# Patient Record
Sex: Female | Born: 1945 | Race: White | Hispanic: No | Smoking: Former smoker
Health system: Southern US, Community
[De-identification: ages and names within clinical notes are randomized; demographics above are authoritative.]

## PROBLEM LIST (undated history)

## (undated) DIAGNOSIS — I10 Essential (primary) hypertension: Secondary | ICD-10-CM

## (undated) DIAGNOSIS — Z9889 Other specified postprocedural states: Secondary | ICD-10-CM

## (undated) DIAGNOSIS — T7840XA Allergy, unspecified, initial encounter: Secondary | ICD-10-CM

## (undated) DIAGNOSIS — R112 Nausea with vomiting, unspecified: Secondary | ICD-10-CM

## (undated) DIAGNOSIS — E785 Hyperlipidemia, unspecified: Secondary | ICD-10-CM

## (undated) DIAGNOSIS — G43909 Migraine, unspecified, not intractable, without status migrainosus: Secondary | ICD-10-CM

## (undated) DIAGNOSIS — M1990 Unspecified osteoarthritis, unspecified site: Secondary | ICD-10-CM

## (undated) DIAGNOSIS — M797 Fibromyalgia: Secondary | ICD-10-CM

## (undated) DIAGNOSIS — Z8489 Family history of other specified conditions: Secondary | ICD-10-CM

## (undated) DIAGNOSIS — I499 Cardiac arrhythmia, unspecified: Secondary | ICD-10-CM

## (undated) DIAGNOSIS — H269 Unspecified cataract: Secondary | ICD-10-CM

## (undated) DIAGNOSIS — J449 Chronic obstructive pulmonary disease, unspecified: Secondary | ICD-10-CM

## (undated) DIAGNOSIS — F32A Depression, unspecified: Secondary | ICD-10-CM

## (undated) DIAGNOSIS — E039 Hypothyroidism, unspecified: Secondary | ICD-10-CM

## (undated) DIAGNOSIS — F329 Major depressive disorder, single episode, unspecified: Secondary | ICD-10-CM

## (undated) DIAGNOSIS — D649 Anemia, unspecified: Secondary | ICD-10-CM

## (undated) DIAGNOSIS — F419 Anxiety disorder, unspecified: Secondary | ICD-10-CM

## (undated) DIAGNOSIS — Z5189 Encounter for other specified aftercare: Secondary | ICD-10-CM

## (undated) HISTORY — PX: APPENDECTOMY: SHX54

## (undated) HISTORY — DX: Anemia, unspecified: D64.9

## (undated) HISTORY — DX: Major depressive disorder, single episode, unspecified: F32.9

## (undated) HISTORY — DX: Encounter for other specified aftercare: Z51.89

## (undated) HISTORY — DX: Essential (primary) hypertension: I10

## (undated) HISTORY — DX: Hypothyroidism, unspecified: E03.9

## (undated) HISTORY — DX: Unspecified cataract: H26.9

## (undated) HISTORY — DX: Fibromyalgia: M79.7

## (undated) HISTORY — DX: Chronic obstructive pulmonary disease, unspecified: J44.9

## (undated) HISTORY — DX: Unspecified osteoarthritis, unspecified site: M19.90

## (undated) HISTORY — PX: ABDOMINAL HYSTERECTOMY: SHX81

## (undated) HISTORY — DX: Depression, unspecified: F32.A

## (undated) HISTORY — DX: Anxiety disorder, unspecified: F41.9

## (undated) HISTORY — DX: Allergy, unspecified, initial encounter: T78.40XA

## (undated) HISTORY — DX: Hyperlipidemia, unspecified: E78.5

## (undated) HISTORY — DX: Migraine, unspecified, not intractable, without status migrainosus: G43.909

## (undated) HISTORY — DX: Cardiac arrhythmia, unspecified: I49.9

## (undated) HISTORY — PX: CATARACT EXTRACTION: SUR2

---

## 1991-11-30 HISTORY — PX: NASAL SINUS SURGERY: SHX719

## 2002-09-03 ENCOUNTER — Encounter: Payer: Self-pay | Admitting: Internal Medicine

## 2005-08-18 ENCOUNTER — Ambulatory Visit: Payer: Self-pay | Admitting: Anesthesiology

## 2008-07-16 LAB — HM MAMMOGRAPHY: HM Mammogram: NORMAL

## 2009-10-17 ENCOUNTER — Ambulatory Visit: Payer: Self-pay | Admitting: Gastroenterology

## 2009-10-17 DIAGNOSIS — K625 Hemorrhage of anus and rectum: Secondary | ICD-10-CM

## 2009-10-17 DIAGNOSIS — R197 Diarrhea, unspecified: Secondary | ICD-10-CM | POA: Insufficient documentation

## 2009-10-17 LAB — CONVERTED CEMR LAB: Tissue Transglutaminase Ab, IgA: 0.3 units (ref <7)

## 2009-10-20 LAB — CONVERTED CEMR LAB
ALT: 17 units/L (ref 0–35)
AST: 24 units/L (ref 0–37)
BUN: 10 mg/dL (ref 6–23)
Basophils Absolute: 0.1 10*3/uL (ref 0.0–0.1)
Basophils Relative: 0.8 % (ref 0.0–3.0)
CO2: 29 meq/L (ref 19–32)
Calcium: 9.3 mg/dL (ref 8.4–10.5)
Creatinine, Ser: 0.7 mg/dL (ref 0.4–1.2)
Eosinophils Absolute: 0.1 10*3/uL (ref 0.0–0.7)
GFR calc non Af Amer: 90.36 mL/min (ref >60)
HCT: 36.7 % (ref 36.0–46.0)
Hemoglobin: 12.5 g/dL (ref 12.0–15.0)
IgA: 100 mg/dL (ref 68–378)
Lymphs Abs: 3.3 10*3/uL (ref 0.7–4.0)
MCHC: 34.1 g/dL (ref 30.0–36.0)
Neutro Abs: 3.8 10*3/uL (ref 1.4–7.7)
RBC: 3.52 M/uL — ABNORMAL LOW (ref 3.87–5.11)
RDW: 13.5 % (ref 11.5–14.6)
Total Bilirubin: 0.6 mg/dL (ref 0.3–1.2)

## 2009-11-04 ENCOUNTER — Telehealth (INDEPENDENT_AMBULATORY_CARE_PROVIDER_SITE_OTHER): Payer: Self-pay | Admitting: *Deleted

## 2009-11-14 ENCOUNTER — Encounter: Payer: Self-pay | Admitting: Gastroenterology

## 2010-01-08 ENCOUNTER — Telehealth (INDEPENDENT_AMBULATORY_CARE_PROVIDER_SITE_OTHER): Payer: Self-pay | Admitting: *Deleted

## 2010-01-08 ENCOUNTER — Encounter (INDEPENDENT_AMBULATORY_CARE_PROVIDER_SITE_OTHER): Payer: Self-pay | Admitting: *Deleted

## 2010-07-06 ENCOUNTER — Ambulatory Visit: Payer: Self-pay | Admitting: Internal Medicine

## 2010-07-06 DIAGNOSIS — F411 Generalized anxiety disorder: Secondary | ICD-10-CM | POA: Insufficient documentation

## 2010-07-06 DIAGNOSIS — I498 Other specified cardiac arrhythmias: Secondary | ICD-10-CM

## 2010-07-06 DIAGNOSIS — M810 Age-related osteoporosis without current pathological fracture: Secondary | ICD-10-CM

## 2010-07-06 DIAGNOSIS — M797 Fibromyalgia: Secondary | ICD-10-CM

## 2010-07-06 DIAGNOSIS — E039 Hypothyroidism, unspecified: Secondary | ICD-10-CM

## 2010-07-06 DIAGNOSIS — R002 Palpitations: Secondary | ICD-10-CM

## 2010-07-06 LAB — CONVERTED CEMR LAB
Basophils Absolute: 0.1 10*3/uL (ref 0.0–0.1)
Basophils Relative: 1 % (ref 0–1)
Calcium: 9.5 mg/dL (ref 8.4–10.5)
Eosinophils Absolute: 0.1 10*3/uL (ref 0.0–0.7)
Eosinophils Relative: 1 % (ref 0–5)
Free T4: 0.39 ng/dL — ABNORMAL LOW (ref 0.80–1.80)
Glucose, Bld: 83 mg/dL (ref 70–99)
HCT: 37.2 % (ref 36.0–46.0)
Lymphs Abs: 2.6 10*3/uL (ref 0.7–4.0)
MCHC: 32.8 g/dL (ref 30.0–36.0)
MCV: 100.5 fL — ABNORMAL HIGH (ref 78.0–100.0)
Magnesium: 2.2 mg/dL (ref 1.5–2.5)
Neutrophils Relative %: 53 % (ref 43–77)
Platelets: 284 10*3/uL (ref 150–400)
Potassium: 4.5 meq/L (ref 3.5–5.3)
RDW: 15.5 % (ref 11.5–15.5)
Sodium: 138 meq/L (ref 135–145)
TSH: 96.764 microintl units/mL — ABNORMAL HIGH (ref 0.350–4.500)
Vit D, 1,25-Dihydroxy: 25 — ABNORMAL LOW (ref 30–89)
WBC: 6.9 10*3/uL (ref 4.0–10.5)

## 2010-07-13 ENCOUNTER — Telehealth: Payer: Self-pay | Admitting: Internal Medicine

## 2010-08-04 ENCOUNTER — Telehealth: Payer: Self-pay | Admitting: Internal Medicine

## 2010-08-04 ENCOUNTER — Encounter: Payer: Self-pay | Admitting: Internal Medicine

## 2010-08-04 ENCOUNTER — Emergency Department (HOSPITAL_BASED_OUTPATIENT_CLINIC_OR_DEPARTMENT_OTHER): Admission: EM | Admit: 2010-08-04 | Discharge: 2010-08-04 | Payer: Self-pay | Admitting: Emergency Medicine

## 2010-08-04 ENCOUNTER — Ambulatory Visit: Payer: Self-pay | Admitting: Internal Medicine

## 2010-08-04 ENCOUNTER — Ambulatory Visit: Payer: Self-pay | Admitting: Diagnostic Radiology

## 2010-08-06 ENCOUNTER — Ambulatory Visit: Payer: Self-pay | Admitting: Internal Medicine

## 2010-08-06 ENCOUNTER — Telehealth: Payer: Self-pay | Admitting: Internal Medicine

## 2010-08-21 ENCOUNTER — Ambulatory Visit: Payer: Self-pay | Admitting: Internal Medicine

## 2010-08-21 LAB — CONVERTED CEMR LAB
BUN: 14 mg/dL (ref 6–23)
CO2: 28 meq/L (ref 19–32)
Eosinophils Relative: 0.7 % (ref 0.0–5.0)
HCT: 34.6 % — ABNORMAL LOW (ref 36.0–46.0)
Lymphs Abs: 1.8 10*3/uL (ref 0.7–4.0)
MCV: 103 fL — ABNORMAL HIGH (ref 78.0–100.0)
Monocytes Absolute: 0.6 10*3/uL (ref 0.1–1.0)
Platelets: 260 10*3/uL (ref 150.0–400.0)
RDW: 14.2 % (ref 11.5–14.6)
Sodium: 139 meq/L (ref 135–145)
WBC: 7.8 10*3/uL (ref 4.5–10.5)
aPTT: 29.6 s — ABNORMAL HIGH (ref 21.7–28.8)

## 2010-08-25 ENCOUNTER — Encounter: Payer: Self-pay | Admitting: Internal Medicine

## 2010-08-28 ENCOUNTER — Ambulatory Visit: Payer: Self-pay | Admitting: Internal Medicine

## 2010-08-28 ENCOUNTER — Ambulatory Visit (HOSPITAL_COMMUNITY): Admission: RE | Admit: 2010-08-28 | Discharge: 2010-08-28 | Payer: Self-pay | Admitting: Internal Medicine

## 2010-08-28 HISTORY — PX: CARDIAC ELECTROPHYSIOLOGY MAPPING AND ABLATION: SHX1292

## 2010-09-07 ENCOUNTER — Ambulatory Visit: Payer: Self-pay | Admitting: Internal Medicine

## 2010-09-07 DIAGNOSIS — G4700 Insomnia, unspecified: Secondary | ICD-10-CM

## 2010-09-28 ENCOUNTER — Ambulatory Visit: Payer: Self-pay | Admitting: Internal Medicine

## 2010-09-30 ENCOUNTER — Encounter: Payer: Self-pay | Admitting: Internal Medicine

## 2010-10-02 ENCOUNTER — Telehealth: Payer: Self-pay | Admitting: Internal Medicine

## 2010-10-02 ENCOUNTER — Ambulatory Visit: Payer: Self-pay | Admitting: Internal Medicine

## 2010-10-02 DIAGNOSIS — J209 Acute bronchitis, unspecified: Secondary | ICD-10-CM

## 2010-12-01 ENCOUNTER — Encounter: Payer: Self-pay | Admitting: Internal Medicine

## 2010-12-01 LAB — PULMONARY FUNCTION TEST

## 2010-12-07 ENCOUNTER — Ambulatory Visit
Admission: RE | Admit: 2010-12-07 | Discharge: 2010-12-07 | Payer: Self-pay | Source: Home / Self Care | Attending: Internal Medicine | Admitting: Internal Medicine

## 2010-12-07 DIAGNOSIS — I10 Essential (primary) hypertension: Secondary | ICD-10-CM | POA: Insufficient documentation

## 2010-12-08 ENCOUNTER — Encounter (INDEPENDENT_AMBULATORY_CARE_PROVIDER_SITE_OTHER): Payer: Self-pay | Admitting: *Deleted

## 2010-12-08 ENCOUNTER — Telehealth: Payer: Self-pay | Admitting: Internal Medicine

## 2010-12-22 ENCOUNTER — Ambulatory Visit: Admit: 2010-12-22 | Payer: Self-pay | Admitting: Critical Care Medicine

## 2010-12-24 ENCOUNTER — Ambulatory Visit: Admit: 2010-12-24 | Payer: Self-pay | Admitting: Internal Medicine

## 2010-12-29 NOTE — Assessment & Plan Note (Signed)
Summary: 1 MONTH FOLLOW UP/MHF   Vital Signs:  Patient profile:   65 year old female Menstrual status:  hysterectomy Height:      62 inches Weight:      118.25 pounds BMI:     21.71 Temp:     98.0 degrees F oral Pulse rate:   176 / minute Pulse rhythm:   irregular Resp:     22 per minute BP sitting:   131 / 114  (left arm)  Vitals Entered By: Glendell Docker CMA (August 04, 2010 2:03 PM)  Primary Care Provider:  Dondra Spry DO   History of Present Illness: 65 y/o  reports sudden onset of palpitation / tachycardia on her way to our office pt has had similar episodes in the past EKG shows SVT - rate of 166 and ST changes she denies chest pain or shortness of breath  she thinks drinking mineral water this AM may have triggered her symptoms she has seen cardiologist in the past but never eletrophysiologist  hypothyroidism - started synthroid.  she exp right eye swelling after starting medication.  she stopped then resumed.  no recurrence of eye swelling she has appt with [facility]o and [facility]:  allergy / intolerance to thyroid medication  she is severely hypothyroid.  recent TSH 96.7 she has chronic facial puffiness husband reports armour thyroid was best toloerated out of all thyroid replacements   Allergies: 1)  ! Pcn 2)  ! Valium  Past History:  Past Medical History: anxiety Heart arrhythmia Asthma Depression Fibromyalgia   elevated cholesterol Hypertension Hypothyroidism  Family History: sister diagnosed with colon cancer at 14, eventually passed from it.    Social History: Occupation: Disabled Married 29 years  2 daughters 1 son   Review of Systems  The patient denies chest pain and syncope.    Physical Exam  General:  alert, well-developed, and well-nourished.   Head:  normocephalic and atraumatic.   Eyes:  mild facial edema Mouth:  good dentition and pharynx pink and moist.   Neck:  No deformities, masses, or tenderness noted.no  thyromegaly and no neck tenderness.   Lungs:  normal respiratory effort, normal breath sounds, no crackles, and no wheezes.  slightly prolonged expiration Heart:  no gallop and tachycardia.   Extremities:  No lower extremity edema  Neurologic:  cranial nerves II-XII intact and gait normal.     Impression & Recommendations:  Problem # 1:  SUPRAVENTRICULAR TACHYCARDIA (ICD-427.89) 65 y/o with hx of intermiittent palpitations experiencing exacerbation.  pt referrred to ER for adenosine. ER physician suggests out pt cardiac eval in point of care markers normal arrange referral to Dr. Ladona Ridgel  Her updated medication list for this problem includes:    Metoprolol Succinate 25 Mg Xr24h-tab (Metoprolol succinate) .Marland Kitchen... 1 by mouth once daily  Orders: Cardiology Referral (Cardiology)  Problem # 2:  HYPOTHYROIDISM (ICD-244.9) pt with severe hypothyroidism.  she has been intolerant to most thyroid replacements in the past.  armour thyroid may be least bothersome.  swith to armour thyroid.  pt advised to try taking allegra 180 mg at bedtime then take armour thyroid in AM.  she was referred to Endo at [facility]  The following medications were removed from the medication list:    Synthroid 50 Mcg Tabs (Levothyroxine sodium) ..... One by mouth once daily Her updated medication list for this problem includes:    Armour Thyroid 60 Mg Tabs (Thyroid) ..... One by mouth once daily  Labs Reviewed: TSH: 96.764 (  07/06/2010)     Problem # 3:  FIBROMYALGIA (ICD-729.1) msk complaints likely related to severe hypothyroidism.  try to wean of pain meds if pt able to achieve euthyroid state Her updated medication list for this problem includes:    Hydrocodone-acetaminophen 7.5-500 Mg Tabs (Hydrocodone-acetaminophen) .Marland Kitchen... Take 1 tablet by mouth three times a day as needed  Complete Medication List: 1)  Clonazepam 0.5 Mg Tabs (Clonazepam) .... Take 1 tab by mouth at bedtime 2)  Lexapro 5 Mg Tabs (Escitalopram  oxalate) .Marland Kitchen.. 1 by mouth once daily 3)  Flovent Hfa 110 Mcg/act Aero (Fluticasone propionate  hfa) .... Up to four times a day as needed 4)  Hydrocodone-acetaminophen 7.5-500 Mg Tabs (Hydrocodone-acetaminophen) .... Take 1 tablet by mouth three times a day as needed 5)  Oxazepam 10 Mg Caps (Oxazepam) .... Take 1 tablet by mouth two times a day as needed 6)  Zantac 75 75 Mg Tabs (Ranitidine hcl) .... Take 1 capsule by mouth once a day as needed 7)  Metoprolol Succinate 25 Mg Xr24h-tab (Metoprolol succinate) .Marland Kitchen.. 1 by mouth once daily 8)  Armour Thyroid 60 Mg Tabs (Thyroid) .... One by mouth once daily Prescriptions: ARMOUR THYROID 60 MG TABS (THYROID) one by mouth once daily  #30 x 1   Entered and Authorized by:   D. Thomos Lemons DO   Signed by:   D. Thomos Lemons DO on 08/04/2010   Method used:   Electronically to        [facility] (retail)       [address]       [place], [place]  [address]       Ph: [phone] or [phone]       Fax: [phone]   RxID:   [id number] HYDROCODONE-ACETAMINOPHEN 7.5-500 MG TABS (HYDROCODONE-ACETAMINOPHEN) Take 1 tablet by mouth three times a day as needed  #90 x 0   Entered and Authorized by:   D. Thomos Lemons DO   Signed by:   D. Thomos Lemons DO on 08/04/2010   Method used:   Print then Give to Patient   RxID:   [id number] CLONAZEPAM 0.5 MG TABS (CLONAZEPAM) Take 1 tab by mouth at bedtime  #30 x 3   Entered and Authorized by:   D. Thomos Lemons DO   Signed by:   D. Thomos Lemons DO on 08/04/2010   Method used:   Print then Give to Patient   RxID:   [id number]

## 2010-12-29 NOTE — Progress Notes (Signed)
Summary: records request-- Joann Ray  Phone Note Outgoing Call   Summary of Call: Faxed request for consult and follow up notes and labs from Dr. Nicholos Johns.  Phone [phone]  fax) [phone].  Initial call taken by: Mervin Kung CMA Duncan Dull),  October 02, 2010 12:03 PM  Follow-up for Phone Call        Have not received records, request refaxed. [name] CMA Duncan Dull)  October 07, 2010 8:55 AM   Additional Follow-up for Phone Call Additional follow up Details #1::        Records received. [name] CMA Duncan Dull)  October 21, 2010 9:19 AM

## 2010-12-29 NOTE — Letter (Signed)
Summary: Appointment Reminder  [facility]  [address] [place], [place] [address]   Phone: [phone]  Fax: [phone]        January 08, 2010 MRN: [id number]    [facility] [address] [place], [place]  [address]    Dear Ms. [facility],   We have been unable to reach you by phone to schedule a Colonoscopy  appointment that was recommended for you by Dr. Christella Hartigan.  It is very   important that we reach you to schedule an appointment. We hope that you  allow [place] to participate in your health care needs. Please contact us at  [phone] at your earliest convenience to schedule your appointment.     Sincerely,    Chales Abrahams CMA (AAMA)

## 2010-12-29 NOTE — Assessment & Plan Note (Signed)
Summary: eph 3-4 weeks svt ablation/mt   Visit Type:  Follow-up Referring Provider:  n/a Primary Provider:  Dondra Spry DO   History of Present Illness: Joann Ray returns  today for followup of SVT.  The patient is a pleasant 65 yo woman who has had SVT for 38 yrs.  The spells start and stop suddenly and can be terminated with vagal maneuvers.  She underwent EPS/RFA of AVNRT several weeks ago.   She has rare palpitations but has not had any sustained arrhythmias.  No c/p or sob.  Current Medications (verified): 1)  Clonazepam 0.5 Mg Tabs (Clonazepam) .... Take 1 Tab By Mouth At Bedtime 2)  Lexapro 5 Mg Tabs (Escitalopram Oxalate) .Marland Kitchen.. 1 By Mouth Once Daily 3)  Flovent Hfa 110 Mcg/act Aero (Fluticasone Propionate  Hfa) .... Up To Four Times A Day As Needed 4)  Hydrocodone-Acetaminophen 7.5-500 Mg Tabs (Hydrocodone-Acetaminophen) .... Take 1 Tablet By Mouth Three Times A Day As Needed 5)  Oxazepam 10 Mg Caps (Oxazepam) .... Take 1 Tablet By Mouth Two Times A Day As Needed 6)  Zantac 75 75 Mg Tabs (Ranitidine Hcl) .... Take 1 Capsule By Mouth Once A Day As Needed 7)  Synthroid 25 Mcg Tabs (Levothyroxine Sodium) .... Take 1 Tablet By Mouth Once A Day  Allergies: 1)  ! Pcn 2)  ! Valium  Past History:  Past Medical History: Last updated: 09/07/2010 anxiety Heart arrhythmia  Asthma Depression Fibromyalgia   elevated cholesterol Hypertension Hypothyroidism  Past Surgical History: Last updated: 07/06/2010 appendectomy Hysterectomy Sinus Surgery 1993   Review of Systems  The patient denies chest pain, syncope, dyspnea on exertion, and peripheral edema.    Vital Signs:  Patient profile:   65 year old female Menstrual status:  hysterectomy Height:      62 inches Weight:      118 pounds BMI:     21.66 Pulse rate:   45 / minute BP sitting:   138 / 86  (right arm)  Vitals Entered By: Laurance Flatten CMA (September 28, 2010 2:37 PM)  Physical Exam  General:  alert,  well-developed, and well-nourished.   Head:  normocephalic and atraumatic.   Eyes:  mild facial edema Mouth:  good dentition and pharynx pink and moist.   Neck:  No deformities, masses, or tenderness noted.no thyromegaly and no neck tenderness.   Lungs:  normal respiratory effort and normal breath sounds.   Heart:  normal rate, regular rhythm, and no gallop.   Abdomen:  soft, non-tender, normal bowel sounds, no hepatomegaly, and no splenomegaly.   Msk:  Back normal, normal gait. Muscle strength and tone normal. Pulses:  pulses normal in all 4 extremities Extremities:  No clubbing or cyanosis. Neurologic:  Alert and oriented x 3.   Impression & Recommendations:  Problem # 1:  SUPRAVENTRICULAR TACHYCARDIA (ICD-427.89) She has not had any symptoms except for a rare brief palpitation.  I will see her back as needed.  Problem # 2:  BRADYCARDIA (ICD-427.89) She has asymptomatic bradycardia.  She will call if she develops light headedness.

## 2010-12-29 NOTE — Progress Notes (Signed)
Summary: patient thought they were to pick up an inhaler   Phone Note From Pharmacy   [facility] Call For: Artist Pais   Summary of Call: patient thought you were calling ain an inhaler.  Please let pharmacy know [phone] Initial call taken by: Roselle Locus,  October 02, 2010 1:14 PM  Follow-up for Phone Call        Rx corrected per Dr Artist Pais to 2 puffs twice a day. Correction given to Derryl Harbor at [facility] [employer]. Pt notified. [name] CMA Duncan Dull)  October 02, 2010 4:50 PM     New/Updated Medications: FLOVENT HFA 110 MCG/ACT AERO (FLUTICASONE PROPIONATE  HFA) 2 puffs twice a day Prescriptions: FLOVENT HFA 110 MCG/ACT AERO (FLUTICASONE PROPIONATE  HFA) up to four times a day as needed  #1 x 5   Entered and Authorized by:   D. Thomos Lemons DO   Signed by:   D. Thomos Lemons DO on 10/02/2010   Method used:   Electronically to        [facility] (retail)       [address]       [place], [place]  [address]       Ph: [phone] or [phone]       Fax: [phone]   RxID:   [id number]

## 2010-12-29 NOTE — Letter (Signed)
Summary: [facility]  [facility]   Imported By: Maryln Gottron 11/02/2010 12:31:27  _____________________________________________________________________  External Attachment:    Type:   Image     Comment:   External Document

## 2010-12-29 NOTE — Assessment & Plan Note (Signed)
Summary: new to est/mhf   Vital Signs:  Patient profile:   65 year old female Menstrual status:  hysterectomy Height:      62 inches Weight:      121.25 pounds BMI:     22.26 O2 Sat:      100 % on Room air Temp:     98.1 degrees F oral Pulse rate:   47 / minute Pulse rhythm:   regular Resp:     20 per minute BP sitting:   130 / 80  (right arm) Cuff size:   regular  Vitals Entered By: Glendell Docker CMA (July 06, 2010 1:30 PM)  O2 Flow:  Room air  Primary Care Provider:  D. Thomos Lemons DO   History of Present Illness: 65 y/o white female to establish prev PCP Benedetto Goad  hx of hypothyroidism  she has not taken thyroid medication x 1 month thyroid medication makes her fibromyalgia worse, can't sleep, joint pains prev tried cytomel, armour thyroid, and synthroid also tried taking thyroid medication at bedtime - makes me feel wired  hx of silent migraines - diagnosed by Dr. Providence Crosby in [place]  hx of fibromyalgia - seen at [facility] - 2000-2001 prescribed hydrocodone neurontin not effective muscle relaxers makes her feel "wired"  chronic poor sleep - trouble shutting her mind off  omega 3 and cholcolate also makes her feel poorly     Preventive Screening-Counseling & Management  Alcohol-Tobacco     Alcohol drinks/day: 0     Smoking Status: current     Packs/Day: 0.75     Year Started: 1963  Caffeine-Diet-Exercise     Caffeine use/day: 2 beverages daily     Does Patient Exercise: no  EKG  Procedure date:  07/06/2010  Findings:      Sinus bradycardia 43 bpm lateral T wave abnormality  Allergies: 1)  ! Pcn 2)  ! Valium  Past History:  Past Medical History: anxiety Heart arrhythmia Asthma Depression Fibromyalgia  elevated cholesterol Hypertension Hypothyroidism  Past Surgical History: appendectomy Hysterectomy Sinus Surgery 1993   Family History: sister diagnosed with colon cancer at 75, eventually passed from it.   Social  History: Occupation: Disabled Married 29 years 2 daughters 1 son  Smoking Status:  current Packs/Day:  0.75 Caffeine use/day:  2 beverages daily Does Patient Exercise:  no  Review of Systems       All other systems were reviewed and were negative.  General:  Complains of fatigue; denies loss of appetite. ENT:  Denies decreased hearing and earache. CV:  Complains of fatigue; denies chest pain or discomfort, fainting, and swelling of feet. Resp:  Denies chest discomfort, cough, and wheezing. MS:  Complains of muscle aches.  Physical Exam  General:  alert, well-developed, and well-nourished.   Head:  normocephalic and atraumatic.   Eyes:  pupils equal, pupils round, and pupils reactive to light.   Ears:  R ear normal and L ear normal.   Mouth:  pharynx pink and moist.   Neck:  No deformities, masses, or tenderness noted.no thyromegaly and no neck tenderness.   Lungs:  normal respiratory effort, normal breath sounds, no crackles, and no wheezes.  slightly prolonged expiration Heart:  normal rate, regular rhythm, and no gallop.   Abdomen:  soft, non-tender, normal bowel sounds, no hepatomegaly, and no splenomegaly.   Extremities:  No lower extremity edema  Neurologic:  cranial nerves II-XII intact and gait normal.   Psych:  normally interactive, good eye  contact, not anxious appearing, and not depressed appearing.     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   she has difficulty tolerating thyroid replacement.   It causes joint pains / exacerbates her fibromyalgia.  she has tried multiple preps w/o improvement.   refer to endo  Her updated medication list for this problem includes:    Synthroid 150 Mcg Tabs (Levothyroxine sodium) .Marland Kitchen... 1 by mouth once daily  Orders: T-Basic Metabolic Panel [id number]) T-TSH [phone]) T-T4, Free [phone]) T- * Misc. Laboratory test [id number])  Problem  # 2:  BRADYCARDIA (ICD-427.89) reduce b blocker dose.  hypothyroidism may be contributing  The following medications were removed from the medication list:    Atenolol 25 Mg Tabs (Atenolol) .Marland Kitchen... 1 by mouth once daily    Toprol Xl 25 Mg Xr24h-tab (Metoprolol succinate) .Marland Kitchen... Take 1 tablet by mouth once a day as needed fast heart rate Her updated medication list for this problem includes:    Metoprolol Succinate 25 Mg Xr24h-tab (Metoprolol succinate) .Marland Kitchen... 1 by mouth once daily  Labs Reviewed: Na: 140 (10/17/2009)   K+: 4.4 (10/17/2009)   CL: 103 (10/17/2009)   HCO3: 29 (10/17/2009) Ca: 9.3 (10/17/2009)   TSH: 25.70 (10/17/2009)   HCO3: 29 (10/17/2009)  Problem # 3:  ANXIETY DEPRESSION (ICD-300.4) reasses after adequate mgt of hypothyroidism Orders: T-CBC w/Diff ([id number])  Problem # 4:  PALPITATIONS, OCCASIONAL (ICD-785.1)  The following medications were removed from the medication list:    Atenolol 25 Mg Tabs (Atenolol) .Marland Kitchen... 1 by mouth once daily    Toprol Xl 25 Mg Xr24h-tab (Metoprolol succinate) .Marland Kitchen... Take 1 tablet by mouth once a day as needed fast heart rate Her updated medication list for this problem includes:    Metoprolol Succinate 25 Mg Xr24h-tab (Metoprolol succinate) .Marland Kitchen... 1 by mouth once daily  Orders: T-Magnesium ([id number]) EKG w/ Interpretation (93[address])  Problem # 5:  FIBROMYALGIA (ICD-729.1) prev seen at [facility].   she has been on hydrocodone for years.  we discussed tapering off and trying other tx / medications Her updated medication list for this problem includes:    Hydrocodone-acetaminophen 7.5-500 Mg Tabs (Hydrocodone-acetaminophen) .Marland Kitchen... Take 1 tablet by mouth three times a day as needed  Complete Medication List: 1)  Synthroid 150 Mcg Tabs (Levothyroxine sodium) .Marland Kitchen.. 1 by mouth once daily 2)  Clonazepam 0.5 Mg Tabs (Clonazepam) .... Take 1 tab by mouth at bedtime 3)  Lexapro 5 Mg Tabs (Escitalopram oxalate) .Marland Kitchen.. 1 by mouth once daily 4)  Flovent Hfa  110 Mcg/act Aero (Fluticasone propionate  hfa) .... Up to four times a day as needed 5)  Hydrocodone-acetaminophen 7.5-500 Mg Tabs (Hydrocodone-acetaminophen) .... Take 1 tablet by mouth three times a day as needed 6)  Oxazepam 10 Mg Caps (Oxazepam) .... Take 1 tablet by mouth two times a day as needed 7)  Zantac 75 75 Mg Tabs (Ranitidine hcl) .... Take 1 capsule by mouth once a day as needed 8)  Metoprolol Succinate 25 Mg Xr24h-tab (Metoprolol succinate) .Marland Kitchen.. 1 by mouth once daily  Patient Instructions: 1)  Please forward copy of your previous MRI of brain, 2 D Echo, and stress test.   2)  Please schedule a follow-up appointment in 1 month. Prescriptions: HYDROCODONE-ACETAMINOPHEN 7.5-500 MG TABS (HYDROCODONE-ACETAMINOPHEN) Take 1 tablet by mouth three times a day as needed  #90 x 0   Entered and Authorized by:   D. Thomos Lemons DO   Signed by:  Dondra Spry DO on 07/06/2010   Method used:   Print then Give to Patient   RxID:   [id number] CLONAZEPAM 0.5 MG TABS (CLONAZEPAM) Take 1 tab by mouth at bedtime  #30 x 0   Entered and Authorized by:   D. Thomos Lemons DO   Signed by:   D. Thomos Lemons DO on 07/06/2010   Method used:   Print then Give to Patient   RxID:   [id number] METOPROLOL SUCCINATE 25 MG XR24H-TAB (METOPROLOL SUCCINATE) 1 by mouth once daily  #30 x 1   Entered and Authorized by:   D. Thomos Lemons DO   Signed by:   D. Thomos Lemons DO on 07/06/2010   Method used:   Electronically to        [facility]  [place] [address] (retail)       [address]       [place], [place]  [address]       Ph: [phone] or [phone]       Fax: [phone]   RxID:   [id number]   Current Allergies (reviewed today): ! PCN ! VALIUM   Preventive Care Screening  Pap Smear:    Date:  07/06/2010    Results:  Hysterecomy  Mammogram:    Date:  07/16/2008    Results:  normal   Last Tetanus Booster:    Date:  07/19/2005    Results:  Historical

## 2010-12-29 NOTE — Consult Note (Signed)
Summary: [facility]  [facility]   Imported By: Maryln Gottron 11/02/2010 12:34:32  _____________________________________________________________________  External Attachment:    Type:   Image     Comment:   External Document

## 2010-12-29 NOTE — Progress Notes (Signed)
Summary: Colon  Phone Note Outgoing Call Call back at [facility] Phone [phone]   Call placed by: Chales Abrahams CMA Duncan Dull),  January 08, 2010 8:04 AM Summary of Call: called to remind pt to reschedule procedure left message on machine to call back  Initial call taken by: Chales Abrahams CMA Duncan Dull),  January 08, 2010 8:04 AM  Follow-up for Phone Call        left message on machine to call back letter mailed. Follow-up by: Chales Abrahams CMA Duncan Dull),  January 08, 2010 4:17 PM

## 2010-12-29 NOTE — Assessment & Plan Note (Signed)
Summary: SINUS/ COUGH/HEA--Rm 3   Vital Signs:  Patient profile:   65 year old female Menstrual status:  hysterectomy Height:      62 inches Weight:      118 pounds O2 Sat:      99 % on Room air Temp:     97.9 degrees F oral Pulse rate:   60 / minute Pulse rhythm:   regular Resp:     18 per minute BP sitting:   120 / 80  (left arm) Cuff size:   regular  Vitals Entered By: Mervin Kung CMA Duncan Dull) (October 02, 2010 10:38 AM)  O2 Flow:  Room air CC: Pt states she has had a sore throat since the weekend. Has had sinus pressure and productive cough with yellow sputum x 1 week. Is Patient Diabetic? No Pain Assessment Patient in pain? no      Comments States ribs hurt from coughing. [name] CMA Duncan Dull)  October 02, 2010 10:44 AM    Primary Care Provider:  Dondra Spry DO  CC:  Pt states she has had a sore throat since the weekend. Has had sinus pressure and productive cough with yellow sputum x 1 week.Marland Kitchen  History of Present Illness: 1 week of cough - slight discolored sinus congestion sore throat mild SOB  allergy testing completed   Preventive Screening-Counseling & Management  Alcohol-Tobacco     Alcohol drinks/day: 0     Smoking Status: quit < 6 months     Packs/Day: 0.75     Year Started: 1963  Allergies: 1)  ! Pcn  Past History:  Past Medical History: anxiety Heart arrhythmia  Asthma Depression Fibromyalgia   elevated cholesterol Hypertension Hypothyroidism COPD  Physical Exam  General:  alert, well-developed, and well-nourished.   Head:  normocephalic and atraumatic.   Ears:  R ear normal and L ear normal.   Mouth:  pharyngeal erythema.   Lungs:  normal respiratory effort.  scattered expiratory wheezing Heart:  normal rate, regular rhythm, and no gallop.     Impression & Recommendations:  Problem # 1:  ACUTE BRONCHITIS (ICD-466.0)  Her updated medication list for this problem includes:    Flovent Hfa 110 Mcg/act Aero  (Fluticasone propionate  hfa) .Marland Kitchen... 2 puffs twice a day    Avelox 400 Mg Tabs (Moxifloxacin hcl) ..... One by mouth once daily    Hydrocod Polst-chlorphen Polst 10-8 Mg/2ml Lqcr (Hydrocod polst-chlorphen polst) .Marland KitchenMarland KitchenMarland KitchenMarland Kitchen 5 ml by mouth two times a day prn  Take antibiotics and other medications as directed. Encouraged to push clear liquids, get enough rest, and take acetaminophen as needed. To be seen in 5-7 days if no improvement, sooner if worse.  Complete Medication List: 1)  Clonazepam 0.5 Mg Tabs (Clonazepam) .... Take 1 tab by mouth at bedtime 2)  Lexapro 5 Mg Tabs (Escitalopram oxalate) .Marland Kitchen.. 1 by mouth once daily 3)  Flovent Hfa 110 Mcg/act Aero (Fluticasone propionate  hfa) .... 2 puffs twice a day 4)  Hydrocodone-acetaminophen 7.5-500 Mg Tabs (Hydrocodone-acetaminophen) .... Take 1 tablet by mouth three times a day as needed 5)  Oxazepam 10 Mg Caps (Oxazepam) .... Take 1 tablet by mouth two times a day as needed 6)  Zantac 75 75 Mg Tabs (Ranitidine hcl) .... Take 1 capsule by mouth once a day as needed 7)  Synthroid 25 Mcg Tabs (Levothyroxine sodium) .... Take 1 tablet by mouth once a day 8)  Avelox 400 Mg Tabs (Moxifloxacin hcl) .... One by mouth once  daily 9)  Medrol (pak) 4 Mg Tabs (Methylprednisolone) .... Take as directed 10)  Hydrocod Polst-chlorphen Polst 10-8 Mg/1ml Lqcr (Hydrocod polst-chlorphen polst) .... 5 ml by mouth two times a day prn  Other Orders: Rapid Strep ([address])  Patient Instructions: 1)  Call our office if your symptoms do not  improve or gets worse. Prescriptions: HYDROCOD POLST-CHLORPHEN POLST 10-8 MG/5ML LQCR (HYDROCOD POLST-CHLORPHEN POLST) 5 ml by mouth two times a day prn  #60 ml x 0   Entered and Authorized by:   D. Thomos Lemons DO   Signed by:   D. Thomos Lemons DO on 10/02/2010   Method used:   Print then Give to Patient   RxID:   [id number] MEDROL (PAK) 4 MG TABS (METHYLPREDNISOLONE) take as directed  #1 x 0   Entered and Authorized by:   D. Thomos Lemons DO   Signed by:   D. Thomos Lemons DO on 10/02/2010   Method used:   Electronically to        [facility] (retail)       [address]       [place], [place]  [address]       Ph: [phone] or [phone]       Fax: [phone]   RxID:   [id number]    Orders Added: 1)  Rapid Strep [75643] 2)  Est. Patient Level III [32951]     Current Allergies (reviewed today): ! PCN  Laboratory Results   Date/Time Reported: Mervin Kung CMA [facility] Dull)  October 02, 2010 10:51 AM   Other Tests  Rapid Strep: negative  Kit Test Internal QC: Positive   (Normal Range: Negative)

## 2010-12-29 NOTE — Assessment & Plan Note (Signed)
Summary: nep/ ed referral dx:- svt / gd   Visit Type:  Initial Consult Referring Provider:  n/a Primary Provider:  Dondra Spry DO   History of Present Illness: Joann Ray is referred today for evaluation of SVT by Dr. Artist Pais.  The patient is a pleasant 65 yo woman who has had SVT for 38 yrs.  The spells start and stop suddenly and can be terminated with vagal maneuvers.  Bending over will often start the episodes and they may last several hrs.  She denies syncope or chest pain.  They are increasing in frequency and severity.  Current Medications (verified): 1)  Clonazepam 0.5 Mg Tabs (Clonazepam) .... Take 1 Tab By Mouth At Bedtime 2)  Lexapro 5 Mg Tabs (Escitalopram Oxalate) .Marland Kitchen.. 1 By Mouth Once Daily 3)  Flovent Hfa 110 Mcg/act Aero (Fluticasone Propionate  Hfa) .... Up To Four Times A Day As Needed 4)  Hydrocodone-Acetaminophen 7.5-500 Mg Tabs (Hydrocodone-Acetaminophen) .... Take 1 Tablet By Mouth Three Times A Day As Needed 5)  Oxazepam 10 Mg Caps (Oxazepam) .... Take 1 Tablet By Mouth Two Times A Day As Needed 6)  Zantac 75 75 Mg Tabs (Ranitidine Hcl) .... Take 1 Capsule By Mouth Once A Day As Needed 7)  Metoprolol Succinate 25 Mg Xr24h-Tab (Metoprolol Succinate) .Marland Kitchen.. 1 By Mouth Once Daily 8)  Armour Thyroid 60 Mg Tabs (Thyroid) .... One By Mouth Once Daily  Allergies: 1)  ! Pcn 2)  ! Valium  Past History:  Past Medical History: Last updated: 08/04/2010 anxiety Heart arrhythmia Asthma Depression Fibromyalgia   elevated cholesterol Hypertension Hypothyroidism  Past Surgical History: Last updated: 07/06/2010 appendectomy Hysterectomy Sinus Surgery 1993   Family History: Last updated: 08/04/2010 sister diagnosed with colon cancer at 6, eventually passed from it.    Social History: Last updated: 08/04/2010 Occupation: Disabled Married 29 years  2 daughters 1 son   Review of Systems       All systems reviewed and negative except as noted in the HPI and  mild wheezing.  Vital Signs:  Patient profile:   65 year old female Menstrual status:  hysterectomy Height:      62 inches Weight:      118 pounds BMI:     21.66 Pulse rate:   46 / minute BP sitting:   148 / 88  (left arm)  Vitals Entered By: Laurance Flatten CMA (August 06, 2010 9:28 AM)  Physical Exam  General:  Well developed, well nourished, in no acute distress.  HEENT: normal Neck: supple. No JVD. Carotids 2+ bilaterally no bruits Cor: RRR no rubs, gallops or murmur Lungs: CTA except for a mild respiratory wheeze. Ab: soft, nontender. nondistended. No HSM. Good bowel sounds Ext: warm. no cyanosis, clubbing or edema Neuro: alert and oriented. Grossly nonfocal. affect pleasant    EKG  Procedure date:  08/06/2010  Findings:      Sinus bradycardia with rate of:  46.  Impression & Recommendations:  Problem # 1:  SUPRAVENTRICULAR TACHYCARDIA (ICD-427.89) I have discussed the risks/benefits/goals/expectations of EPS/RFA of SVT and she wishes to proceed.   Her updated medication list for this problem includes:    Metoprolol Succinate 25 Mg Xr24h-tab (Metoprolol succinate) .Marland Kitchen... 1 by mouth once daily  Problem # 2:  BRADYCARDIA (ICD-427.89) Hopefully this will improve once she has had her ablation and we can stop the metoprolol. Her updated medication list for this problem includes:    Metoprolol Succinate 25 Mg Xr24h-tab (Metoprolol succinate) .Marland KitchenMarland KitchenMarland KitchenMarland Kitchen 1  by mouth once daily  Patient Instructions: 1)  Your physician recommends that you continue on your current medications as directed. Please refer to the Current Medication list given to you today. 2)  Your physician has recommended that you have an ablation.  Catheter ablation is a medical procedure used to treat some cardiac arrhythmias (irregular heartbeats). During catheter ablation, a long, thin, flexible tube is put into a blood vessel in your groin (upper thigh), or neck. This tube is called an ablation catheter. It is  then guided to your heart through the blood vessel. Radiofrequency waves destroy small areas of heart tissue where abnormal heartbeats may cause an arrhythmia to start.  Please see the instruction sheet given to you today.

## 2010-12-29 NOTE — Progress Notes (Signed)
Summary: Lab Results  Phone Note Outgoing Call   Summary of Call: call pt - blood work shows pt severely hypothyroid.  I suggest referral to endocrinologist at [facility] since she has difficulty with side effects from thyroid replacement  also inform pt.  vit d levels low.  I suggest pt take vit D3 2000 units once daily (OTC) Initial call taken by: D. Thomos Lemons DO,  July 13, 2010 2:35 PM  Follow-up for Phone Call        Also, I suggest pt start at least lower dose of synthroid while waiting for endo appt Follow-up by: D. Thomos Lemons DO,  July 13, 2010 5:49 PM  Additional Follow-up for Phone Call Additional follow up Details #1::        attempted to contact patient at [phone] no answer, voice message left for patient to return call Additional Follow-up by: Glendell Docker CMA,  July 14, 2010 8:04 AM    Additional Follow-up for Phone Call Additional follow up Details #2::    call returned from patient, she was advised per Dr Artist Pais instructions.  She states the pharmacy never received the rx for the toprol and asked if it could be resubmitted to the pharmacy. She was informed rx would be resubmitted. Patients states she has a follow up  appt with Dr Artist Pais on the 6th of September and will discuss the remainder of her labs and referral when she comes in Follow-up by: Glendell Docker CMA,  July 15, 2010 8:13 AM  New/Updated Medications: SYNTHROID 50 MCG TABS (LEVOTHYROXINE SODIUM) one by mouth once daily Prescriptions: METOPROLOL SUCCINATE 25 MG XR24H-TAB (METOPROLOL SUCCINATE) 1 by mouth once daily  #30 x 1   Entered by:   Glendell Docker CMA   Authorized by:   D. Thomos Lemons DO   Signed by:   Glendell Docker CMA on 07/15/2010   Method used:   Electronically to        [facility] (retail)       [address]       [place], [place]  [address]       Ph: [phone] or [phone]       Fax: [phone]   RxID:    [id number] SYNTHROID 50 MCG TABS (LEVOTHYROXINE SODIUM) one by mouth once daily  #30 x 2   Entered and Authorized by:   D. Thomos Lemons DO   Signed by:   D. Thomos Lemons DO on 07/13/2010   Method used:   Electronically to        [facility] (retail)       [address]       [place], [place]  [address]       Ph: [phone] or [phone]       Fax: [phone]   RxID:   [id number]

## 2010-12-29 NOTE — Progress Notes (Signed)
Summary: DR RAY WOULD LIKE TO SPEAK TO JXB [phone]  Phone Note Other Incoming   Caller: DR RAY  Summary of Call: DR RAY WOULD LIKE TO SPEAK TO YOU ABOUT THIS PATIENT  [phone] Initial call taken by: Roselle Locus,  August 04, 2010 3:10 PM  Follow-up for Phone Call        pt converted with adenosine. she is feeling better if cardiac enzymes normal,  we will arrange out pt f/u with Dr. Ladona Ridgel Follow-up by: D. Thomos Lemons DO,  August 04, 2010 4:59 PM

## 2010-12-29 NOTE — Assessment & Plan Note (Signed)
Summary: FOLLOW UP/DK   Vital Signs:  Patient profile:   65 year old female Menstrual status:  hysterectomy Height:      62 inches Weight:      122 pounds BMI:     22.39 O2 Sat:      98 % on Room air Temp:     97.9 degrees F oral Pulse rate:   50 / minute Pulse rhythm:   regular Resp:     18 per minute BP sitting:   138 / 78  (right arm) Cuff size:   regular  Vitals Entered By: Glendell Docker CMA (September 07, 2010 2:35 PM)  O2 Flow:  Room air CC: follow-up visit Is Patient Diabetic? No Pain Assessment Patient in pain? no        Primary Care Provider:  Dondra Spry DO  CC:  follow-up visit.  History of Present Illness: 65 y/o white female for f/u pt had ablation. palpitations much better  severe hypothyroidism seen by endo at [facility] allergy eval initiated  fibromyalgia - [facility] specialist feels muscle pains should improve with tx of hypothyroidism armour thyroid changed to synthroid due to concerns of T3 exacerbating arrhythmia  Preventive Screening-Counseling & Management  Alcohol-Tobacco     Smoking Status: quit < 6 months  Allergies: 1)  ! Pcn 2)  ! Valium  Past History:  Past Medical History: anxiety Heart arrhythmia  Asthma Depression Fibromyalgia   elevated cholesterol Hypertension Hypothyroidism  Social History: Smoking Status:  quit < 6 months  Physical Exam  General:  alert, well-developed, and well-nourished.   Lungs:  normal respiratory effort and normal breath sounds.   Heart:  normal rate, regular rhythm, and no gallop.     Impression & Recommendations:  Problem # 1:  HYPOTHYROIDISM (ICD-244.9) Assessment Improved continue f/u with endo at [facility]  Her updated medication list for this problem includes:    Synthroid 25 Mcg Tabs (Levothyroxine sodium) .Marland Kitchen... Take 1 tablet by mouth once a day  Problem # 2:  FIBROMYALGIA (ICD-729.1) continue hydrocodone for now.  we discussed tapering off pain meds once she is  euthyroid  Her updated medication list for this problem includes:    Hydrocodone-acetaminophen 7.5-500 Mg Tabs (Hydrocodone-acetaminophen) .Marland Kitchen... Take 1 tablet by mouth three times a day as needed  Problem # 3:  INSOMNIA (ICD-780.52) Assessment: Unchanged  Problem # 4:  SUPRAVENTRICULAR TACHYCARDIA (ICD-427.89) Assessment: Improved S/P ablation for SVT The following medications were removed from the medication list:    Metoprolol Succinate 25 Mg Xr24h-tab (Metoprolol succinate) .Marland Kitchen... 1 by mouth once daily  Complete Medication List: 1)  Clonazepam 0.5 Mg Tabs (Clonazepam) .... Take 1 tab by mouth at bedtime 2)  Lexapro 5 Mg Tabs (Escitalopram oxalate) .Marland Kitchen.. 1 by mouth once daily 3)  Flovent Hfa 110 Mcg/act Aero (Fluticasone propionate  hfa) .... Up to four times a day as needed 4)  Hydrocodone-acetaminophen 7.5-500 Mg Tabs (Hydrocodone-acetaminophen) .... Take 1 tablet by mouth three times a day as needed 5)  Oxazepam 10 Mg Caps (Oxazepam) .... Take 1 tablet by mouth two times a day as needed 6)  Zantac 75 75 Mg Tabs (Ranitidine hcl) .... Take 1 capsule by mouth once a day as needed 7)  Synthroid 25 Mcg Tabs (Levothyroxine sodium) .... Take 1 tablet by mouth once a day  Other Orders: Influenza Vaccine MCR (82956) Administration Flu vaccine - MCR (O1308)  Patient Instructions: 1)  Please schedule a follow-up appointment in 3 months. Prescriptions: OXAZEPAM 10 MG CAPS (  OXAZEPAM) Take 1 tablet by mouth two times a day as needed  #60 x 3   Entered and Authorized by:   D. Thomos Lemons DO   Signed by:   D. Thomos Lemons DO on 09/07/2010   Method used:   Print then Give to Patient   RxID:   [id number] LEXAPRO 5 MG TABS (ESCITALOPRAM OXALATE) 1 by mouth once daily  #90 x 1   Entered and Authorized by:   D. Thomos Lemons DO   Signed by:   D. Thomos Lemons DO on 09/07/2010   Method used:   Print then Give to Patient   RxID:   [id number] CLONAZEPAM 0.5 MG TABS (CLONAZEPAM) Take 1 tab by  mouth at bedtime  #30 x 3   Entered and Authorized by:   D. Thomos Lemons DO   Signed by:   D. Thomos Lemons DO on 09/07/2010   Method used:   Print then Give to Patient   RxID:   [id number] HYDROCODONE-ACETAMINOPHEN 7.5-500 MG TABS (HYDROCODONE-ACETAMINOPHEN) Take 1 tablet by mouth three times a day as needed  #90 x 2   Entered and Authorized by:   D. Thomos Lemons DO   Signed by:   D. Thomos Lemons DO on 09/07/2010   Method used:   Print then Give to Patient   RxID:   [id number]   Current Allergies (reviewed today): ! PCN ! VALIUM    Immunizations Administered:  Influenza Vaccine # 1:    Vaccine Type: Fluvax MCR    Site: right deltoid    Mfr: GlaxoSmithKline    Dose: 0.5 ml    Route: IM    Given by: Glendell Docker CMA    Exp. Date: 05/29/2011    Lot #: [id number]    VIS given: 06/23/10 version given September 07, 2010.  Flu Vaccine Consent Questions:    Do you have a history of severe allergic reactions to this vaccine? no    Any prior history of allergic reactions to egg and/or gelatin? no    Do you have a sensitivity to the preservative Thimersol? no    Do you have a past history of Guillan-Barre Syndrome? no    Do you currently have an acute febrile illness? no    Have you ever had a severe reaction to latex? no    Vaccine information given and explained to patient? yes    Are you currently pregnant? no

## 2010-12-29 NOTE — Letter (Signed)
Summary: Handicapped Placard/[facility]  Handicapped Placard/[facility]   Imported By: Sherian Rein 08/12/2010 11:13:58  _____________________________________________________________________  External Attachment:    Type:   Image     Comment:   External Document

## 2010-12-29 NOTE — Progress Notes (Signed)
Summary: FU visit question from patient  Phone Note Call from Patient Call back at Home Phone [phone]   Caller: Patient Summary of Call: Pt said thank you for referring her to Dr Ladona Ridgel, she wants to know if she needs to make an appt for a f/u visit with you Initial call taken by: Lannette Donath,  August 06, 2010 1:12 PM  Follow-up for Phone Call        yes, I suggest OV within 1 month Follow-up by: D. Thomos Lemons DO,  August 06, 2010 1:44 PM  Additional Follow-up for Phone Call Additional follow up Details #1::        call returned to patient at [phone], she has been advised per Dr Artist Pais instructions. Follow up scheduled for September 07, 2010 Additional Follow-up by: Glendell Docker CMA,  August 06, 2010 4:38 PM

## 2010-12-29 NOTE — Letter (Signed)
Summary: ELectrophysiology/Ablation Procedure Instructions  [facility], Main Office  [address]   [place], [place] [address]   Phone: [phone]  Fax: [phone]     Electrophysiology/Ablation Procedure Instructions    You are scheduled for a(n) SVT ablation on 08/28/10 at 7:30am with Dr. Ladona Ridgel.  1.  Please come to the Short Stay Center at [facility] at 5:30am on the day of your procedure.  2.  Come prepared to stay overnight.   Please bring your insurance cards and a list of your medications.  3.  Come to the [facility] office on 9/23/11at 9:00am for lab work.    You do not have to be fasting.  4.  Do not have anything to eat or drink after midnight the night before your procedure.  5.  Do NOT take these medications for 2  days before your procedure unless otherwise instructed:  Metoprlol.  All of your remaining medications may be taken with a small amount of water.  6.  Educational material received:  Ablation   * Occasionally, EP studies and ablations can become lengthy.  Please make your family aware of this before your procedure starts.  Average time ranges from 2-8 hours for EP studies/ablations.  Your physician will locate your family after the procedure with the results.  * If you have any questions after you get home, please call the office at [phone].  Joann Ray

## 2010-12-31 NOTE — Assessment & Plan Note (Signed)
Summary: 3 MONTH FOLLOW UP/MHF   Vital Signs:  Patient profile:   65 year old female Menstrual status:  hysterectomy Height:      62 inches Weight:      127 pounds BMI:     23.31 Temp:     97.9 degrees F oral Pulse rate:   74 / minute Pulse rhythm:   regular Resp:     16 per minute BP sitting:   180 / 90  (left arm) Cuff size:   regular  Vitals Entered By: Mervin Kung CMA Duncan Dull) (December 07, 2010 2:30 PM) CC: Pt here for 3 month follow up. Is Patient Diabetic? No Pain Assessment Patient in pain? no      Comments Pt states she has completed Avelox, Medrol, and Hydrocod--chlorphen liquid. Pt agrees all other med doses and directions are correct. Mervin Kung CMA Duncan Dull)  December 07, 2010 2:37 PM    Primary Care Provider:  Dondra Spry DO  CC:  Pt here for 3 month follow up.Marland Kitchen  History of Present Illness: quit smoking  seen by allergist  PFTs showed  reversible airway dz she is not using maintenance inhalers regularly  hypothyroidism - allergy testing reported normal still taking 1/2 of synthroid  labile BP   Preventive Screening-Counseling & Management  Alcohol-Tobacco     Alcohol drinks/day: 0     Smoking Status: quit < 6 months     Packs/Day: 0.75     Year Started: 1963  Allergies: 1)  ! Pcn  Past History:  Past Medical History: anxiety Heart arrhythmia  Asthma Depression Fibromyalgia    elevated cholesterol Hypertension Hypothyroidism COPD  Past Surgical History: appendectomy Hysterectomy Sinus Surgery 1993    Social History: Occupation: Disabled Married 29 years  2 daughters  1 son   Physical Exam  General:  alert, well-developed, and well-nourished.   Lungs:  normal respiratory effort and normal breath sounds.   Heart:  normal rate, regular rhythm, and no gallop.   Extremities:  No lower extremity edema   Impression & Recommendations:  Problem # 1:  HYPERTENSION (ICD-401.9)  Her updated medication list for this  problem includes:    Clonidine Hcl 0.1 Mg Tabs (Clonidine hcl) .Marland Kitchen... 1/2 by mouth two times a day  BP today: 180/90 Prior BP: 120/80 (10/02/2010)  Labs Reviewed: K+: 4.0 (08/21/2010) Creat: : 0.8 (08/21/2010)     Problem # 2:  COPD (ICD-496)  Her updated medication list for this problem includes:    Qvar 80 Mcg/act Aers (Beclomethasone dipropionate) .Marland Kitchen... 2 puffs bid  Orders: Pulmonary Referral (Pulmonary)  Pulmonary Functions Reviewed: O2 sat: 99 (10/02/2010)     Vaccines Reviewed: Flu Vax: Fluvax MCR (09/07/2010)  Problem # 3:  HYPOTHYROIDISM (ICD-244.9) alllergy work up negative.  f/u with endo re:  increasing synthroid dose  Her updated medication list for this problem includes:    Synthroid 50 Mcg Tabs (Levothyroxine sodium) .Marland Kitchen... Take 1/2 tablet daily.  Complete Medication List: 1)  Clonazepam 0.5 Mg Tabs (Clonazepam) .... Take 1 tab by mouth at bedtime and 1/2 tab two times a day as needed 2)  Lexapro 5 Mg Tabs (Escitalopram oxalate) .Marland Kitchen.. 1 by mouth once daily 3)  Qvar 80 Mcg/act Aers (Beclomethasone dipropionate) .... 2 puffs bid 4)  Zantac 75 75 Mg Tabs (Ranitidine hcl) .... Take 1 capsule by mouth once a day as needed 5)  Synthroid 50 Mcg Tabs (Levothyroxine sodium) .... Take 1/2 tablet daily. 6)  Clonidine Hcl 0.1 Mg  Tabs (Clonidine hcl) .... 1/2 by mouth two times a day 7)  Hydrocodone-acetaminophen 7.5-325 Mg Tabs (Hydrocodone-acetaminophen) .... One by mouth three times a day as needed  Patient Instructions: 1)  Please schedule a follow-up appointment in 2 months. Prescriptions: HYDROCODONE-ACETAMINOPHEN 7.5-325 MG TABS (HYDROCODONE-ACETAMINOPHEN) one by mouth three times a day as needed  #90 x 2   Entered and Authorized by:   D. Thomos Lemons DO   Signed by:   D. Thomos Lemons DO on 12/07/2010   Method used:   Print then Give to Patient   RxID:   [id number] HYDROCODONE-ACETAMINOPHEN 7.5-500 MG TABS (HYDROCODONE-ACETAMINOPHEN) Take 1 tablet by mouth three  times a day as needed  #90 x 2   Entered and Authorized by:   D. Thomos Lemons DO   Signed by:   D. Thomos Lemons DO on 12/07/2010   Method used:   Print then Give to Patient   RxID:   [id number] QVAR 80 MCG/ACT AERS (BECLOMETHASONE DIPROPIONATE) 2 puffs bid  #1 x 3   Entered and Authorized by:   D. Thomos Lemons DO   Signed by:   D. Thomos Lemons DO on 12/07/2010   Method used:   Electronically to        [facility] (retail)       [address]       [place], [place]  [address]       Ph: [phone] or [phone]       Fax: [phone]   RxID:   [id number] CLONIDINE HCL 0.1 MG TABS (CLONIDINE HCL) 1/2 by mouth two times a day  #30 x 0   Entered and Authorized by:   D. Thomos Lemons DO   Signed by:   D. Thomos Lemons DO on 12/07/2010   Method used:   Handwritten   RxID:   [id number] CLONAZEPAM 0.5 MG TABS (CLONAZEPAM) Take 1 tab by mouth at bedtime and 1/2 tab two times a day as needed  #60 x 3   Entered and Authorized by:   D. Thomos Lemons DO   Signed by:   D. Thomos Lemons DO on 12/07/2010   Method used:   Print then Give to Patient   RxID:   [id number]    Orders Added: 1)  Pulmonary Referral [Pulmonary] 2)  Est. Patient Level III [[address]]    Current Allergies (reviewed today): ! PCN

## 2010-12-31 NOTE — Progress Notes (Signed)
Summary: Clonidine Rx  Phone Note Call from Patient Call back at [facility] Phone [phone]   Caller: Patient Call For: D. Thomos Lemons DO Summary of Call: patient called stating she did not recieve a rx for her blood pressure. She was informed a printed for Clonidine was provided at her office visit. She states she does not have the rx. Initial call taken by: Glendell Docker CMA,  December 08, 2010 1:59 PM  Follow-up for Phone Call        I will send electronically Follow-up by: D. Thomos Lemons DO,  December 08, 2010 5:13 PM    Prescriptions: CLONIDINE HCL 0.1 MG TABS (CLONIDINE HCL) 1/2 by mouth two times a day  #30 x 0   Entered by:   Glendell Docker CMA   Authorized by:   D. Thomos Lemons DO   Signed by:   D. Thomos Lemons DO on 12/08/2010   Method used:   Electronically to        [facility] (retail)       [address]       [place], [place]  [address]       Ph: [phone] or [phone]       Fax: [phone]   RxID:   [id number]

## 2010-12-31 NOTE — Letter (Signed)
Summary: [facility]  [facility]   Imported By: Lanelle Bal 12/22/2010 14:18:06  _____________________________________________________________________  External Attachment:    Type:   Image     Comment:   External Document

## 2010-12-31 NOTE — Letter (Signed)
Summary: Primary Care Consult Scheduled Letter  [place] at [facility]  [address]   [place], [place] [address]   Phone: [phone]  Fax: [phone]      12/08/2010 MRN: [id number]  [facility] [address] [place], [place]  [address]    Dear Ms. [facility],      We have scheduled an appointment for you.  At the recommendation of Dr.YOO, we have scheduled you a consult with DR Gildardo Cranker PULMONARY on JANUARY 24,2012 at 11AM .  Their address is_[address]  , [place] The office phone number is [phone].  If this appointment day and time is not convenient for you, please feel free to call the office of the doctor you are being referred to at the number listed above and reschedule the appointment.     It is important for you to keep your scheduled appointments. We are here to make sure you are given good patient care.    Thank you,  Darral Dash Patient Care Coordinator [place] at [facility]

## 2011-01-21 ENCOUNTER — Ambulatory Visit (HOSPITAL_BASED_OUTPATIENT_CLINIC_OR_DEPARTMENT_OTHER): Payer: 59

## 2011-01-21 ENCOUNTER — Ambulatory Visit (INDEPENDENT_AMBULATORY_CARE_PROVIDER_SITE_OTHER): Payer: 59 | Admitting: Internal Medicine

## 2011-01-21 ENCOUNTER — Other Ambulatory Visit: Payer: Self-pay | Admitting: Internal Medicine

## 2011-01-21 ENCOUNTER — Encounter: Payer: Self-pay | Admitting: Internal Medicine

## 2011-01-21 DIAGNOSIS — R220 Localized swelling, mass and lump, head: Secondary | ICD-10-CM | POA: Insufficient documentation

## 2011-01-21 DIAGNOSIS — Z139 Encounter for screening, unspecified: Secondary | ICD-10-CM

## 2011-01-21 DIAGNOSIS — J069 Acute upper respiratory infection, unspecified: Secondary | ICD-10-CM

## 2011-01-21 DIAGNOSIS — R221 Localized swelling, mass and lump, neck: Secondary | ICD-10-CM

## 2011-01-21 DIAGNOSIS — E039 Hypothyroidism, unspecified: Secondary | ICD-10-CM

## 2011-01-21 LAB — CONVERTED CEMR LAB: TSH: 73.894 microintl units/mL — ABNORMAL HIGH (ref 0.350–4.500)

## 2011-02-04 ENCOUNTER — Ambulatory Visit: Payer: Self-pay | Admitting: Internal Medicine

## 2011-02-09 NOTE — Assessment & Plan Note (Signed)
Summary: knot on shoulder/ss--rm 3   Vital Signs:  Patient profile:   65 year old female Menstrual status:  hysterectomy Height:      62 inches Weight:      131.25 pounds BMI:     24.09 O2 Sat:      100 % on Room air Temp:     97.8 degrees F oral Pulse rate:   52 / minute Pulse rhythm:   irregular Resp:     16 per minute BP sitting:   130 / 80  (right arm) Cuff size:   regular  Vitals Entered By: Mervin Kung CMA (AAMA) (January 21, 2011 1:19 PM)  O2 Flow:  Room air CC: Pt states she is having head congestion and dry cough x 1 week. Has knot on left shoulder x 8 months with intermittent swelling. Is Patient Diabetic? No Pain Assessment Patient in pain? no        Primary Care Provider:  Dondra Spry DO  CC:  Pt states she is having head congestion and dry cough x 1 week. Has knot on left shoulder x 8 months with intermittent swelling.Marland Kitchen  History of Present Illness: 65 y/o female c/o facial pressure, drainage and cough feels like she smells something burning cough is non production sputum is usually clear but can be discolored  pt also c/o swelling near left collar bone she noticed area getting larger.     Preventive Screening-Counseling & Management  Alcohol-Tobacco     Alcohol drinks/day: 0     Smoking Status: quit < 6 months     Packs/Day: 0.75     Year Started: 1963  Allergies: 1)  ! Pcn  Past History:  Past Medical History: anxiety Heart arrhythmia  Asthma Depression  Fibromyalgia    elevated cholesterol Hypertension Hypothyroidism COPD  Past Surgical History: appendectomy Hysterectomy Sinus Surgery 1993     Family History: sister diagnosed with colon cancer at 107, eventually passed from it.     Social History: Occupation: Disabled Married 29 years  2 daughters  1 son    Physical Exam  General:  alert, well-developed, and well-nourished.   Ears:  R and L TM retracted Chest Wall:  left supra clavicular prominence,  non  tender Lungs:  normal respiratory effort and normal breath sounds.   Heart:  normal rate, regular rhythm, and no gallop.   Extremities:  trace left pedal edema and trace right pedal edema.   Neurologic:  cranial nerves II-XII intact.   Psych:  normally interactive, good eye contact, not anxious appearing, and not depressed appearing.     Impression & Recommendations:  Problem # 1:  URI (ICD-465.9)  Her updated medication list for this problem includes:    Hydrocodone-homatropine 5-1.5 Mg/77ml Syrp (Hydrocodone-homatropine) .Marland KitchenMarland KitchenMarland KitchenMarland Kitchen 5 ml two times a day as needed  Instructed on symptomatic treatment. Call if symptoms persist or worsen.   Problem # 2:  HYPOTHYROIDISM (ICD-244.9) increase thyroid replacement  Her updated medication list for this problem includes:    Synthroid 75 Mcg Tabs (Levothyroxine sodium) ..... One by mouth once daily  Orders: T-TSH ([address]-60454) T-T4, Free [id number])  Problem # 3:  NECK MASS (ICD-784.2) unexplained left supraclavicular swelling rule out mass/ malignancy Orders: CT with Contrast (CT w/ contrast)  Complete Medication List: 1)  Oxazepam 10 Mg Caps (Oxazepam) .... One tab by mouth two times a day as needed 2)  Lexapro 5 Mg Tabs (Escitalopram oxalate) .Marland Kitchen.. 1 by mouth once daily 3)  Qvar 80 Mcg/act  Aers (Beclomethasone dipropionate) .... 2 puffs bid 4)  Zantac 75 75 Mg Tabs (Ranitidine hcl) .... Take 1 capsule by mouth once a day as needed 5)  Synthroid 75 Mcg Tabs (Levothyroxine sodium) .... One by mouth once daily 6)  Clonidine Hcl 0.1 Mg Tabs (Clonidine hcl) .... 1/2 by mouth two times a day 7)  Hydrocodone-acetaminophen 7.5-325 Mg Tabs (Hydrocodone-acetaminophen) .... One by mouth three times a day as needed 8)  Azithromycin 250 Mg Tabs (Azithromycin) .... 2 tabs by mouth on day one, then one by mouth once daily x 4 days 9)  Hydrocodone-homatropine 5-1.5 Mg/28ml Syrp (Hydrocodone-homatropine) .... 5 ml two times a day as needed  Other  Orders: Mammogram (Screening) (Mammo)  Patient Instructions: 1)  Please schedule a follow-up appointment in 1 month. Prescriptions: HYDROCODONE-HOMATROPINE 5-1.5 MG/5ML SYRP (HYDROCODONE-HOMATROPINE) 5 ml two times a day as needed  #90 ml x 0   Entered and Authorized by:   D. Thomos Lemons DO   Signed by:   D. Thomos Lemons DO on 01/21/2011   Method used:   Print then Give to Patient   RxID:   [id number] OXAZEPAM 10 MG CAPS (OXAZEPAM) one tab by mouth two times a day as needed  #60 x 2   Entered and Authorized by:   D. Thomos Lemons DO   Signed by:   D. Thomos Lemons DO on 01/21/2011   Method used:   Print then Give to Patient   RxID:   [id number] SYNTHROID 75 MCG TABS (LEVOTHYROXINE SODIUM) one by mouth once daily  #30 x 3   Entered and Authorized by:   D. Thomos Lemons DO   Signed by:   D. Thomos Lemons DO on 01/21/2011   Method used:   Print then Give to Patient   RxID:   [id number] AZITHROMYCIN 250 MG TABS (AZITHROMYCIN) 2 tabs by mouth on day one, then one by mouth once daily x 4 days  #6 x 0   Entered and Authorized by:   D. Thomos Lemons DO   Signed by:   D. Thomos Lemons DO on 01/21/2011   Method used:   Print then Give to Patient   RxID:   [id number]    Orders Added: 1)  Mammogram (Screening) [Mammo] 2)  T-TSH [32202-54270] 3)  T-T4, Free [62376-28315] 4)  CT with Contrast [CT w/ contrast] 5)  Est. Patient Level III [17616]

## 2011-02-10 ENCOUNTER — Encounter: Payer: Self-pay | Admitting: Internal Medicine

## 2011-02-11 LAB — CBC
Hemoglobin: 14.4 g/dL (ref 12.0–15.0)
MCH: 34 pg (ref 26.0–34.0)
RBC: 4.22 MIL/uL (ref 3.87–5.11)
WBC: 9.4 10*3/uL (ref 4.0–10.5)

## 2011-02-11 LAB — DIFFERENTIAL
Lymphocytes Relative: 39 % (ref 12–46)
Lymphs Abs: 3.7 10*3/uL (ref 0.7–4.0)
Monocytes Relative: 8 % (ref 3–12)
Neutro Abs: 4.7 10*3/uL (ref 1.7–7.7)
Neutrophils Relative %: 51 % (ref 43–77)

## 2011-02-11 LAB — BASIC METABOLIC PANEL
CO2: 20 mEq/L (ref 19–32)
Calcium: 10 mg/dL (ref 8.4–10.5)
GFR calc Af Amer: >60 mL/min (ref >60)
GFR calc non Af Amer: >60 mL/min (ref >60)
Sodium: 143 mEq/L (ref 135–145)

## 2011-02-11 LAB — POCT CARDIAC MARKERS
CKMB, poc: <1 ng/mL — ABNORMAL LOW (ref 1.0–8.0)
Myoglobin, poc: 38.5 ng/mL (ref 12–200)

## 2011-02-22 ENCOUNTER — Ambulatory Visit (HOSPITAL_BASED_OUTPATIENT_CLINIC_OR_DEPARTMENT_OTHER)
Admission: RE | Admit: 2011-02-22 | Discharge: 2011-02-22 | Disposition: A | Discharge status: Home / Self Care | Payer: 59 | Source: Ambulatory Visit | Attending: Internal Medicine | Admitting: Internal Medicine

## 2011-02-22 ENCOUNTER — Encounter: Payer: Self-pay | Admitting: Internal Medicine

## 2011-02-22 ENCOUNTER — Ambulatory Visit (INDEPENDENT_AMBULATORY_CARE_PROVIDER_SITE_OTHER)
Admission: RE | Admit: 2011-02-22 | Discharge: 2011-02-22 | Disposition: A | Discharge status: Home / Self Care | Payer: 59 | Source: Ambulatory Visit | Attending: Internal Medicine | Admitting: Internal Medicine

## 2011-02-22 ENCOUNTER — Ambulatory Visit (INDEPENDENT_AMBULATORY_CARE_PROVIDER_SITE_OTHER): Payer: 59 | Admitting: Internal Medicine

## 2011-02-22 ENCOUNTER — Other Ambulatory Visit: Payer: Self-pay | Admitting: Internal Medicine

## 2011-02-22 VITALS — BP 160/80 | HR 69 | Temp 97.9°F | Resp 18 | Ht 62.0 in | Wt 131.0 lb

## 2011-02-22 DIAGNOSIS — E039 Hypothyroidism, unspecified: Secondary | ICD-10-CM

## 2011-02-22 DIAGNOSIS — Z139 Encounter for screening, unspecified: Secondary | ICD-10-CM

## 2011-02-22 DIAGNOSIS — R221 Localized swelling, mass and lump, neck: Secondary | ICD-10-CM

## 2011-02-22 DIAGNOSIS — IMO0001 Reserved for inherently not codable concepts without codable children: Secondary | ICD-10-CM

## 2011-02-22 DIAGNOSIS — I10 Essential (primary) hypertension: Secondary | ICD-10-CM

## 2011-02-22 DIAGNOSIS — R220 Localized swelling, mass and lump, head: Secondary | ICD-10-CM

## 2011-02-22 DIAGNOSIS — R222 Localized swelling, mass and lump, trunk: Secondary | ICD-10-CM | POA: Insufficient documentation

## 2011-02-22 DIAGNOSIS — Z1231 Encounter for screening mammogram for malignant neoplasm of breast: Secondary | ICD-10-CM

## 2011-02-22 LAB — TSH: TSH: 82.558 u[IU]/mL — ABNORMAL HIGH (ref 0.350–4.500)

## 2011-02-22 LAB — T4, FREE: Free T4: 0.61 ng/dL — ABNORMAL LOW (ref 0.80–1.80)

## 2011-02-22 MED ORDER — CLONAZEPAM 0.5 MG PO TABS: 0.5000 mg | ORAL_TABLET | Freq: Every evening | ORAL | Status: DC | PRN

## 2011-02-22 MED ORDER — IOHEXOL 300 MG/ML  SOLN
80.0000 mL | Freq: Once | INTRAMUSCULAR | Status: AC | PRN
  Administered 2011-02-22: 80 mL via INTRAVENOUS

## 2011-02-22 MED ORDER — MORPHINE SULFATE CR 30 MG PO TB12: 30.0000 mg | ORAL_TABLET | Freq: Two times a day (BID) | ORAL | Status: DC

## 2011-02-22 MED ORDER — ESCITALOPRAM OXALATE 5 MG PO TABS: 5.0000 mg | ORAL_TABLET | Freq: Every day | ORAL | Status: DC

## 2011-02-22 MED ORDER — LEVOTHYROXINE SODIUM 100 MCG PO TABS: 100.0000 ug | ORAL_TABLET | Freq: Every day | ORAL | Status: DC

## 2011-02-22 NOTE — Progress Notes (Signed)
Subjective:    Patient ID: Joann Ray, female    DOB: 1946/04/15, 65 y.o.   MRN: [id number]  HPI  65 y/o female with severe hypothyroidism, question allergy to thyroid medication, chronic pain and htn for follow up. Pt has been followed by endo at [facility].  Unclear why TSH is not normalizing.  Pt reports taking thyroid replacement  Pt notes increase in muscle aches and pains She has been on vicodin for years - started by pain mgt specialist  Pt also seen for question supraclavicular mass.  CT of neck reviewed. - 02/22/2011  1.  No evidence for left clavicle/supraclavicular mass. 2.  Evidence of prior granulomatous disease within the chest.   Review of Systems No wt change.  Withdrawal symptoms if she misses doses of vicodin  Past Medical History  Diagnosis Date  . Anxiety   . Asthma   . Depression   . Hypertension   . Thyroid disease     hypothyroidism  . COPD (chronic obstructive pulmonary disease)   . Fibromyalgia   . Hyperlipidemia   . Arrhythmia      AVNRT    History   Social History  . Marital Status: Married    Spouse Name: N/A    Number of Children: 3  . Years of Education: N/A   Occupational History  . DISABLED    Social History Main Topics  . Smoking status: Former Games developer  . Smokeless tobacco: Not on file   Comment: September 2011  . Alcohol Use: Not on file  . Drug Use: Not on file  . Sexually Active: Not on file   Other Topics Concern  . Not on file   Social History Narrative  . No narrative on file    Past Surgical History  Procedure Date  . Appendectomy   . Abdominal hysterectomy   . Nasal sinus surgery 1993  . Cardiac electrophysiology mapping and ablation 08/28/2010    EPS/RFA of AVNRT  - Dr. Sharrell Ku    Family History  Problem Relation Age of Onset  . Colon cancer Sister 33    Allergies  Allergen Reactions  . Penicillins     Current Outpatient Prescriptions on File Prior to Visit  Medication Sig Dispense  Refill  . beclomethasone (QVAR) 80 MCG/ACT inhaler Inhale 2 puffs into the lungs 2 (two) times daily.        . cloNIDine (CATAPRES) 0.1 MG tablet Take by mouth. Take 1/2 tablet by mouth twice a day.       . oxazepam (SERAX) 10 MG capsule Take 10 mg by mouth 2 (two) times daily as needed.        . ranitidine (ZANTAC) 75 MG tablet Take 75 mg by mouth daily as needed.          BP 160/80  Pulse 69  Temp(Src) 97.9 F (36.6 C) (Oral)  Resp 18  Ht 5\' 2"  (1.575 m)  Wt 131 lb (59.421 kg)  BMI 23.96 kg/m2  SpO2 99%        Objective:   Physical Exam  Constitutional: She is oriented to person, place, and time. She appears well-developed and well-nourished. No distress.  HENT:  Head: Normocephalic and atraumatic.  Neck: Normal range of motion. Neck supple.  Cardiovascular: Normal rate and regular rhythm.   Pulmonary/Chest: Effort normal and breath sounds normal.  Musculoskeletal: Normal range of motion.  Lymphadenopathy:    She has no cervical adenopathy.  Neurological: She is alert and oriented to person,  place, and time. No cranial nerve deficit.  Skin: Skin is warm and dry.  Psychiatric: She has a normal mood and affect. Her behavior is normal.          Assessment & Plan:

## 2011-02-26 ENCOUNTER — Encounter: Payer: Self-pay | Admitting: Internal Medicine

## 2011-02-26 NOTE — Assessment & Plan Note (Signed)
Somewhat labile.  Continue current medication regimen.   BP Readings from Last 3 Encounters:  02/22/11 160/80  01/21/11 130/80  12/07/10 180/90

## 2011-02-26 NOTE — Assessment & Plan Note (Signed)
CT of check and neck negative for mass

## 2011-02-26 NOTE — Assessment & Plan Note (Signed)
I suspect pt has not been taking her thyroid medication because thyroid replacement increasing clearance of pain meds  Increase synthroid dose. Change vicodin to oramorph sr (longer acting agent)

## 2011-02-26 NOTE — Assessment & Plan Note (Signed)
Pt has been on vicodin for years Switch to oramorph.   We discuss long term goal of eventually weaning pt meds Husband to monitor pt closely with start of new medication

## 2011-03-17 ENCOUNTER — Ambulatory Visit (INDEPENDENT_AMBULATORY_CARE_PROVIDER_SITE_OTHER): Payer: 59 | Admitting: Internal Medicine

## 2011-03-17 ENCOUNTER — Encounter: Payer: Self-pay | Admitting: Internal Medicine

## 2011-03-17 ENCOUNTER — Telehealth: Payer: Self-pay | Admitting: Internal Medicine

## 2011-03-17 DIAGNOSIS — E039 Hypothyroidism, unspecified: Secondary | ICD-10-CM

## 2011-03-17 DIAGNOSIS — R439 Unspecified disturbances of smell and taste: Secondary | ICD-10-CM

## 2011-03-17 DIAGNOSIS — IMO0001 Reserved for inherently not codable concepts without codable children: Secondary | ICD-10-CM

## 2011-03-17 DIAGNOSIS — I10 Essential (primary) hypertension: Secondary | ICD-10-CM

## 2011-03-17 DIAGNOSIS — R431 Parosmia: Secondary | ICD-10-CM

## 2011-03-17 MED ORDER — MORPHINE SULFATE CR 30 MG PO TB12: 30.0000 mg | ORAL_TABLET | Freq: Two times a day (BID) | ORAL | Status: DC

## 2011-03-17 MED ORDER — FLUTICASONE PROPIONATE 50 MCG/ACT NA SUSP: 2.0000 | Freq: Every day | NASAL | Status: DC

## 2011-03-17 MED ORDER — HYDROCHLOROTHIAZIDE 12.5 MG PO CAPS: 12.5000 mg | ORAL_CAPSULE | Freq: Every day | ORAL | Status: DC

## 2011-03-17 NOTE — Telephone Encounter (Signed)
Patient is coming for appt with dr Artist Pais on may 15 and needs lab order faxed to [facility] for the week prior

## 2011-03-17 NOTE — Patient Instructions (Addendum)
Please complete the following blood work before your next follow up appointment. TSH, Free T4 - 244.9 BMET - 401.9 Use Neil Med sinus rinse over the counter 2-3 x per week

## 2011-03-18 NOTE — Telephone Encounter (Signed)
Orders entered and faxed to the lab.  

## 2011-03-23 ENCOUNTER — Encounter: Payer: Self-pay | Admitting: Internal Medicine

## 2011-03-23 DIAGNOSIS — R431 Parosmia: Secondary | ICD-10-CM | POA: Insufficient documentation

## 2011-03-23 NOTE — Progress Notes (Signed)
Subjective:    Patient ID: Joann Ray, female    DOB: September 14, 1946, 65 y.o.   MRN: [id number]  HPI  65 year old white female for followup re:  fibromyalgia. Patient reports good response to MS Contin ER.  Patient experiencing good pain control for at least 8-10 hours.   Patient also using over-the-counter Tylenol as needed.  Patient complains of abnormal smell. She has some nasal congestion but senses burning smell.  Denies headache or facial pressure.  Hypothyroidism-patient reports taking her thyroid replacement.  Hypertension-BP elevated despite use of clonidine  Review of Systems No fever,  No facial pressure Positive for edema  Past Medical History  Diagnosis Date  . Anxiety   . Asthma   . Depression   . Hypertension   . Thyroid disease     hypothyroidism  . COPD (chronic obstructive pulmonary disease)   . Fibromyalgia   . Hyperlipidemia   . Arrhythmia      AVNRT    History   Social History  . Marital Status: Married    Spouse Name: N/A    Number of Children: 3  . Years of Education: N/A   Occupational History  . DISABLED    Social History Main Topics  . Smoking status: Former Games developer  . Smokeless tobacco: Not on file   Comment: September 2011  . Alcohol Use: Not on file  . Drug Use: Not on file  . Sexually Active: Not on file   Other Topics Concern  . Not on file   Social History Narrative  . No narrative on file    Past Surgical History  Procedure Date  . Appendectomy   . Abdominal hysterectomy   . Nasal sinus surgery 1993  . Cardiac electrophysiology mapping and ablation 08/28/2010    EPS/RFA of AVNRT  - Dr. Sharrell Ku    Family History  Problem Relation Age of Onset  . Colon cancer Sister 30    Allergies  Allergen Reactions  . Penicillins     Current Outpatient Prescriptions on File Prior to Visit  Medication Sig Dispense Refill  . beclomethasone (QVAR) 80 MCG/ACT inhaler Inhale 2 puffs into the lungs 2 (two) times  daily.        . clonazePAM (KLONOPIN) 0.5 MG tablet Take 1 tablet (0.5 mg total) by mouth at bedtime as needed for anxiety.  30 tablet  3  . cloNIDine (CATAPRES) 0.1 MG tablet Take by mouth. Take 1/2 tablet by mouth twice a day.       . escitalopram (LEXAPRO) 5 MG tablet Take 1 tablet (5 mg total) by mouth daily.  90 tablet  1  . levothyroxine (LEVOTHROID) 100 MCG tablet Take 1 tablet (100 mcg total) by mouth daily.  30 tablet  2  . oxazepam (SERAX) 10 MG capsule Take 10 mg by mouth 2 (two) times daily as needed.        . ranitidine (ZANTAC) 75 MG tablet Take 75 mg by mouth daily as needed.          BP 164/106  Pulse 55  Temp(Src) 97.7 F (36.5 C) (Oral)  Resp 20  Ht 5\' 2"  (1.575 m)  Wt 129 lb (58.514 kg)  BMI 23.59 kg/m2       Objective:   Physical Exam  Constitutional: She appears well-developed and well-nourished.  HENT:       Mild paleness and edema of nasal turbinates No polyp or obstruction  Eyes: Conjunctivae are normal.  Cardiovascular: Normal rate, regular rhythm  and normal heart sounds.   Pulmonary/Chest: Effort normal and breath sounds normal. She has no wheezes.          Assessment & Plan:

## 2011-03-23 NOTE — Assessment & Plan Note (Signed)
Pt reports abnormal smell (burnt odor) Trial of nasal steroids If persistent symptoms, consider ENT referral

## 2011-03-23 NOTE — Assessment & Plan Note (Signed)
Short acting Vicodin discontinued Improved with MS Contin Continue same dose

## 2011-03-23 NOTE — Assessment & Plan Note (Addendum)
Pt reports good med compliance Monitor TFTs  Lab Results  Component Value Date   TSH 82.558* 02/22/2011

## 2011-03-23 NOTE — Assessment & Plan Note (Signed)
Poor control Add HCTZ 12.5  BP: 164/106 mmHg

## 2011-04-08 ENCOUNTER — Encounter: Payer: Self-pay | Admitting: Internal Medicine

## 2011-04-09 ENCOUNTER — Ambulatory Visit: Payer: 59 | Admitting: Internal Medicine

## 2011-04-12 ENCOUNTER — Ambulatory Visit: Payer: 59 | Admitting: Internal Medicine

## 2011-04-13 ENCOUNTER — Ambulatory Visit: Payer: 59 | Admitting: Internal Medicine

## 2011-04-13 ENCOUNTER — Encounter: Payer: Self-pay | Admitting: Family

## 2011-04-13 ENCOUNTER — Ambulatory Visit (INDEPENDENT_AMBULATORY_CARE_PROVIDER_SITE_OTHER): Payer: 59 | Admitting: Family

## 2011-04-13 ENCOUNTER — Telehealth: Payer: Self-pay | Admitting: Family

## 2011-04-13 VITALS — BP 140/78 | HR 60 | Temp 97.8°F | Resp 16 | Ht 62.0 in | Wt 128.0 lb

## 2011-04-13 DIAGNOSIS — E039 Hypothyroidism, unspecified: Secondary | ICD-10-CM

## 2011-04-13 DIAGNOSIS — F341 Dysthymic disorder: Secondary | ICD-10-CM

## 2011-04-13 DIAGNOSIS — I10 Essential (primary) hypertension: Secondary | ICD-10-CM

## 2011-04-13 DIAGNOSIS — K625 Hemorrhage of anus and rectum: Secondary | ICD-10-CM

## 2011-04-13 DIAGNOSIS — R109 Unspecified abdominal pain: Secondary | ICD-10-CM

## 2011-04-13 LAB — BASIC METABOLIC PANEL
CO2: 24 mEq/L (ref 19–32)
Calcium: 9.3 mg/dL (ref 8.4–10.5)
Creat: 0.83 mg/dL (ref 0.40–1.20)

## 2011-04-13 MED ORDER — ESCITALOPRAM OXALATE 10 MG PO TABS: 10.0000 mg | ORAL_TABLET | Freq: Every day | ORAL | Status: DC

## 2011-04-13 MED ORDER — MORPHINE SULFATE CR 30 MG PO TB12: 30.0000 mg | ORAL_TABLET | Freq: Two times a day (BID) | ORAL | Status: DC

## 2011-04-13 MED ORDER — OXAZEPAM 10 MG PO CAPS: 10.0000 mg | ORAL_CAPSULE | Freq: Two times a day (BID) | ORAL | Status: DC | PRN

## 2011-04-13 MED ORDER — CLONIDINE HCL 0.1 MG PO TABS: ORAL_TABLET | ORAL | Status: DC

## 2011-04-13 NOTE — Assessment & Plan Note (Signed)
Will order CT abdomen and pelvis with contrast.  Need to rule out diverticulitis. Creatinine to be drawn stat.

## 2011-04-13 NOTE — Progress Notes (Signed)
Subjective:    Patient ID: Joann Ray, female    DOB: 01-29-1946, 65 y.o.   MRN: [id number]  HPI  Pt presents today with chief complaint of abdominal pain.  Pain is associated with intermittent diarrhea- having some pain "around the belly button." Symptoms started about 10 days ago and is intermittent in nature.  She reports that she had episode of hematochezia about 2 months.  She reports that she has never had a colonoscopy.  She is s/p appendectomy.  Hypothyroid- feels tearful every morning.  She attributes this to increased dose of levothyroxine. She reports + compliance with her medication.     Review of Systems See HPI  Past Medical History  Diagnosis Date  . Anxiety   . Asthma   . Depression   . Hypertension   . Thyroid disease     hypothyroidism  . COPD (chronic obstructive pulmonary disease)   . Fibromyalgia   . Hyperlipidemia   . Arrhythmia      AVNRT    History   Social History  . Marital Status: Married    Spouse Name: N/A    Number of Children: 3  . Years of Education: N/A   Occupational History  . DISABLED    Social History Main Topics  . Smoking status: Former Games developer  . Smokeless tobacco: Not on file   Comment: September 2011  . Alcohol Use: Not on file  . Drug Use: Not on file  . Sexually Active: Not on file   Other Topics Concern  . Not on file   Social History Narrative  . No narrative on file    Past Surgical History  Procedure Date  . Appendectomy   . Abdominal hysterectomy   . Nasal sinus surgery 1993  . Cardiac electrophysiology mapping and ablation 08/28/2010    EPS/RFA of AVNRT  - Dr. Sharrell Ku    Family History  Problem Relation Age of Onset  . Colon cancer Sister 45    Allergies  Allergen Reactions  . Penicillins     Current Outpatient Prescriptions on File Prior to Visit  Medication Sig Dispense Refill  . beclomethasone (QVAR) 80 MCG/ACT inhaler Inhale 2 puffs into the lungs 2 (two) times daily.         . cetirizine (ZYRTEC) 10 MG tablet Take 5 mg by mouth 2 (two) times daily.        . fluticasone (FLONASE) 50 MCG/ACT nasal spray 2 sprays by Nasal route daily.  16 g  3  . hydrochlorothiazide (,MICROZIDE/HYDRODIURIL,) 12.5 MG capsule Take 1 capsule (12.5 mg total) by mouth daily.  30 capsule  3  . levothyroxine (LEVOTHROID) 100 MCG tablet Take 1 tablet (100 mcg total) by mouth daily.  30 tablet  2  . ranitidine (ZANTAC) 75 MG tablet Take 75 mg by mouth daily as needed.        Marland Kitchen DISCONTD: cloNIDine (CATAPRES) 0.1 MG tablet Take by mouth. Take 1/2 tablet by mouth twice a day.       Marland Kitchen DISCONTD: escitalopram (LEXAPRO) 5 MG tablet Take 1 tablet (5 mg total) by mouth daily.  90 tablet  1  . DISCONTD: morphine (MS CONTIN) 30 MG 12 hr tablet Take 1 tablet (30 mg total) by mouth 2 (two) times daily.  60 tablet  0  . DISCONTD: oxazepam (SERAX) 10 MG capsule Take 10 mg by mouth 2 (two) times daily as needed.          BP 140/78  Pulse  60  Temp(Src) 97.8 F (36.6 C) (Oral)  Resp 16  Ht 5\' 2"  (1.575 m)  Wt 128 lb (58.06 kg)  BMI 23.41 kg/m2       Objective:   Physical Exam  Constitutional:       Doughy appearance of face, awake, alert and in NAD.  Cardiovascular: Normal rate and regular rhythm.   Pulmonary/Chest: Effort normal and breath sounds normal.  Psychiatric: She has a normal mood and affect. Her behavior is normal. Judgment and thought content normal.  Abdomen:  Soft, + bowel sounds.  + tenderness with guarding in the periumbilical region.        Assessment & Plan:

## 2011-04-13 NOTE — Telephone Encounter (Signed)
Please call patient and let her know that I would like for her to increase her lexapro to 10mg .  I have sent rx to her pharmacy.

## 2011-04-13 NOTE — Assessment & Plan Note (Signed)
Will refer to GI for consultation and colonoscopy.

## 2011-04-13 NOTE — Patient Instructions (Signed)
Please complete your lab work and your CT on the first floor. Follow up with Dr. Artist Pais in 1-2 weeks. Call sooner if you develop worsening abdominal pain, nausea, fever or if your diarrhea does not improve.

## 2011-04-13 NOTE — Telephone Encounter (Signed)
Sp[facility] to [employer] [facility] [place], pt still has 3 refills remaining and they will refill rx for pt. Pt has been notified.

## 2011-04-13 NOTE — Telephone Encounter (Signed)
Pt states Efraim Kaufmann said she would refill clonazepam but pharmacy states that they did not receive an rx on this prescription.

## 2011-04-13 NOTE — Assessment & Plan Note (Signed)
I doubt that the increased dose of synthroid is cause for her tearfulness.  Will increase her Lexapro from 5 to 10 mg.

## 2011-04-13 NOTE — Assessment & Plan Note (Signed)
Check f/u tsh.

## 2011-04-14 ENCOUNTER — Ambulatory Visit: Payer: 59 | Admitting: Internal Medicine

## 2011-04-14 ENCOUNTER — Telehealth: Payer: Self-pay | Admitting: Family

## 2011-04-14 ENCOUNTER — Ambulatory Visit: Payer: 59 | Admitting: Family

## 2011-04-14 NOTE — Telephone Encounter (Signed)
Saw that patient has not completed. CT is scheduled for tomorrow AM. Still having abdominal soreness.  Reinforced importance of completing CT and to go to the ED if fever or worsening abdominal pain overnight.  Pt verbalizes understanding.Marland Kitchen

## 2011-04-14 NOTE — Telephone Encounter (Signed)
Pt notified and voices understanding. She states she was taking 1/2 of a 5mg  tablet before but will pick up new rx when she is out of current supply.

## 2011-04-15 ENCOUNTER — Ambulatory Visit (HOSPITAL_BASED_OUTPATIENT_CLINIC_OR_DEPARTMENT_OTHER)
Admission: RE | Admit: 2011-04-15 | Discharge: 2011-04-15 | Disposition: A | Discharge status: Home / Self Care | Payer: Medicare PPO | Source: Ambulatory Visit | Attending: Family | Admitting: Family

## 2011-04-15 ENCOUNTER — Telehealth: Payer: Self-pay | Admitting: Family

## 2011-04-15 DIAGNOSIS — R1033 Periumbilical pain: Secondary | ICD-10-CM

## 2011-04-15 DIAGNOSIS — R197 Diarrhea, unspecified: Secondary | ICD-10-CM | POA: Insufficient documentation

## 2011-04-15 DIAGNOSIS — K921 Melena: Secondary | ICD-10-CM | POA: Insufficient documentation

## 2011-04-15 MED ORDER — IOHEXOL 300 MG/ML  SOLN
100.0000 mL | Freq: Once | INTRAMUSCULAR | Status: AC | PRN
  Administered 2011-04-15: 100 mL via INTRAVENOUS

## 2011-04-15 MED ORDER — PANTOPRAZOLE SODIUM 40 MG PO TBEC: 40.0000 mg | DELAYED_RELEASE_TABLET | Freq: Every day | ORAL | Status: DC

## 2011-04-15 NOTE — Telephone Encounter (Signed)
Please call patient and let her know that her CT looks good.  It does show some thickening of her esophagus which may be due to reflux.  She should stop zantac and start protonix.  Dr. Christella Hartigan will evaluate further at her upcoming GI appointment.

## 2011-04-16 ENCOUNTER — Telehealth: Payer: Self-pay | Admitting: Family

## 2011-04-16 DIAGNOSIS — E039 Hypothyroidism, unspecified: Secondary | ICD-10-CM

## 2011-04-16 MED ORDER — LEVOTHYROXINE SODIUM 125 MCG PO TABS: 125.0000 ug | ORAL_TABLET | Freq: Every day | ORAL | Status: DC

## 2011-04-16 NOTE — Telephone Encounter (Signed)
Pls call patient and notify her that her thyroid test is improving, but we still need to increase her medication.  I have sent rx for new dose to her pharmacy.  She should return in 6 weeks for a TSH- please send order to pharmacy.

## 2011-04-19 NOTE — Progress Notes (Signed)
Addended by: Sandford Craze on: 04/19/2011 03:28 PM   Modules accepted: Orders

## 2011-04-19 NOTE — Telephone Encounter (Signed)
Call placed to patient at [phone], she was informed per [facility] instructions. Lab order for TSH entered for the week of June 25th

## 2011-04-23 ENCOUNTER — Ambulatory Visit: Payer: 59 | Admitting: Internal Medicine

## 2011-05-06 ENCOUNTER — Observation Stay (HOSPITAL_COMMUNITY)
Admission: EM | Admit: 2011-05-06 | Discharge: 2011-05-07 | Disposition: A | Discharge status: Home / Self Care | Payer: Medicare PPO | Attending: Internal Medicine | Admitting: Internal Medicine

## 2011-05-06 DIAGNOSIS — R197 Diarrhea, unspecified: Secondary | ICD-10-CM | POA: Insufficient documentation

## 2011-05-06 DIAGNOSIS — E860 Dehydration: Secondary | ICD-10-CM

## 2011-05-06 DIAGNOSIS — R1084 Generalized abdominal pain: Secondary | ICD-10-CM

## 2011-05-06 DIAGNOSIS — G894 Chronic pain syndrome: Secondary | ICD-10-CM | POA: Insufficient documentation

## 2011-05-06 DIAGNOSIS — R109 Unspecified abdominal pain: Secondary | ICD-10-CM | POA: Insufficient documentation

## 2011-05-06 DIAGNOSIS — E039 Hypothyroidism, unspecified: Secondary | ICD-10-CM | POA: Insufficient documentation

## 2011-05-06 DIAGNOSIS — R42 Dizziness and giddiness: Secondary | ICD-10-CM | POA: Insufficient documentation

## 2011-05-06 DIAGNOSIS — D126 Benign neoplasm of colon, unspecified: Principal | ICD-10-CM | POA: Insufficient documentation

## 2011-05-06 DIAGNOSIS — R110 Nausea: Secondary | ICD-10-CM | POA: Insufficient documentation

## 2011-05-06 DIAGNOSIS — K5730 Diverticulosis of large intestine without perforation or abscess without bleeding: Secondary | ICD-10-CM | POA: Insufficient documentation

## 2011-05-06 DIAGNOSIS — J449 Chronic obstructive pulmonary disease, unspecified: Secondary | ICD-10-CM | POA: Insufficient documentation

## 2011-05-06 DIAGNOSIS — IMO0001 Reserved for inherently not codable concepts without codable children: Secondary | ICD-10-CM | POA: Insufficient documentation

## 2011-05-06 DIAGNOSIS — R5381 Other malaise: Secondary | ICD-10-CM | POA: Insufficient documentation

## 2011-05-06 DIAGNOSIS — Z800 Family history of malignant neoplasm of digestive organs: Secondary | ICD-10-CM | POA: Insufficient documentation

## 2011-05-06 DIAGNOSIS — J4489 Other specified chronic obstructive pulmonary disease: Secondary | ICD-10-CM | POA: Insufficient documentation

## 2011-05-06 DIAGNOSIS — F341 Dysthymic disorder: Secondary | ICD-10-CM | POA: Insufficient documentation

## 2011-05-06 DIAGNOSIS — Z79899 Other long term (current) drug therapy: Secondary | ICD-10-CM | POA: Insufficient documentation

## 2011-05-06 DIAGNOSIS — K625 Hemorrhage of anus and rectum: Secondary | ICD-10-CM | POA: Insufficient documentation

## 2011-05-06 LAB — CBC
MCH: 30.5 pg (ref 26.0–34.0)
MCV: 92 fL (ref 78.0–100.0)
Platelets: 314 10*3/uL (ref 150–400)
RBC: 4.52 MIL/uL (ref 3.87–5.11)
RDW: 13.5 % (ref 11.5–15.5)
WBC: 7.7 10*3/uL (ref 4.0–10.5)

## 2011-05-06 LAB — COMPREHENSIVE METABOLIC PANEL
ALT: 33 U/L (ref 0–35)
Alkaline Phosphatase: 97 U/L (ref 39–117)
BUN: 8 mg/dL (ref 6–23)
Chloride: 100 mEq/L (ref 96–112)
Glucose, Bld: 93 mg/dL (ref 70–99)
Potassium: 3.8 mEq/L (ref 3.5–5.1)
Sodium: 136 mEq/L (ref 135–145)
Total Bilirubin: 0.4 mg/dL (ref 0.3–1.2)
Total Protein: 8 g/dL (ref 6.0–8.3)

## 2011-05-06 LAB — DIFFERENTIAL
Basophils Relative: 1 % (ref 0–1)
Eosinophils Absolute: 0 10*3/uL (ref 0.0–0.7)
Eosinophils Relative: 0 % (ref 0–5)
Lymphs Abs: 2.4 10*3/uL (ref 0.7–4.0)
Monocytes Relative: 6 % (ref 3–12)
Neutrophils Relative %: 62 % (ref 43–77)

## 2011-05-06 LAB — SEDIMENTATION RATE: Sed Rate: 22 mm/hr (ref 0–22)

## 2011-05-06 LAB — CLOSTRIDIUM DIFFICILE BY PCR: Toxigenic C. Difficile by PCR: NEGATIVE

## 2011-05-06 LAB — OCCULT BLOOD, POC DEVICE: Fecal Occult Bld: NEGATIVE

## 2011-05-07 ENCOUNTER — Other Ambulatory Visit: Payer: Self-pay | Admitting: Internal Medicine

## 2011-05-07 DIAGNOSIS — R197 Diarrhea, unspecified: Secondary | ICD-10-CM

## 2011-05-07 DIAGNOSIS — D126 Benign neoplasm of colon, unspecified: Secondary | ICD-10-CM

## 2011-05-07 DIAGNOSIS — Z800 Family history of malignant neoplasm of digestive organs: Secondary | ICD-10-CM

## 2011-05-07 DIAGNOSIS — K5730 Diverticulosis of large intestine without perforation or abscess without bleeding: Secondary | ICD-10-CM

## 2011-05-07 DIAGNOSIS — R1084 Generalized abdominal pain: Secondary | ICD-10-CM

## 2011-05-07 DIAGNOSIS — E860 Dehydration: Secondary | ICD-10-CM

## 2011-05-07 LAB — FECAL LACTOFERRIN, QUANT: Fecal Lactoferrin: NEGATIVE

## 2011-05-07 LAB — OVA AND PARASITE EXAMINATION: Ova and parasites: NONE SEEN

## 2011-05-10 ENCOUNTER — Encounter: Payer: Self-pay | Admitting: Family

## 2011-05-10 ENCOUNTER — Ambulatory Visit (INDEPENDENT_AMBULATORY_CARE_PROVIDER_SITE_OTHER): Payer: Medicare PPO | Admitting: Family

## 2011-05-10 ENCOUNTER — Telehealth: Payer: Self-pay | Admitting: Family

## 2011-05-10 DIAGNOSIS — D539 Nutritional anemia, unspecified: Secondary | ICD-10-CM

## 2011-05-10 DIAGNOSIS — I10 Essential (primary) hypertension: Secondary | ICD-10-CM

## 2011-05-10 DIAGNOSIS — E039 Hypothyroidism, unspecified: Secondary | ICD-10-CM

## 2011-05-10 DIAGNOSIS — F341 Dysthymic disorder: Secondary | ICD-10-CM

## 2011-05-10 LAB — STOOL CULTURE

## 2011-05-10 MED ORDER — OXAZEPAM 10 MG PO CAPS: 10.0000 mg | ORAL_CAPSULE | Freq: Two times a day (BID) | ORAL | Status: DC | PRN

## 2011-05-10 MED ORDER — HYDROCHLOROTHIAZIDE 25 MG PO TABS: 25.0000 mg | ORAL_TABLET | Freq: Every day | ORAL | Status: DC

## 2011-05-10 MED ORDER — MORPHINE SULFATE CR 30 MG PO TB12: 30.0000 mg | ORAL_TABLET | Freq: Two times a day (BID) | ORAL | Status: DC

## 2011-05-10 MED ORDER — SYNTHROID 125 MCG PO TABS: 125.0000 ug | ORAL_TABLET | Freq: Every day | ORAL | Status: DC

## 2011-05-10 NOTE — Progress Notes (Signed)
  Subjective:    Patient ID: Joann Ray, female    DOB: 1946-11-05, 65 y.o.   MRN: [id number]  HPI Joann Ray is a 65 yr old female who presents today for hospital follow up. She reports that she was hospitalized for 2 days last week. Went home on Friday night. Was evaluted for acute diarrheal illness.  Had colonoscopy during the hospitalization.  She is scheduled to follow up with Joann Ray.  Hypothyroid- reports that levothyroxine causes nausea x 1 hour, weak/trembling, this is often followed by 1-2 episodes of loose stool each day. Requests to return to synthroid.  She did not increase dose to 125 as instructed.  Depression-  Notes that she has been tearful. She has not increased her lexapro.    HTN- reports that her BP is up some, still having swelling despite HCTZ   Review of Systems See HPI    Objective:   Physical Exam  Constitutional: She appears well-developed and well-nourished.       "Doughy" appearing face   Cardiovascular: Normal rate and regular rhythm.   Pulmonary/Chest: Effort normal and breath sounds normal.  Skin: Skin is warm and dry.  Psychiatric: She has a normal mood and affect. Her behavior is normal. Judgment and thought content normal.          Assessment & Plan:

## 2011-05-10 NOTE — Patient Instructions (Signed)
Follow up in 1 month   

## 2011-05-10 NOTE — Telephone Encounter (Signed)
Please call patient and let her know that I reviewed the lab work from the hospital.  She is anemic. I would like for her to complete a B12 and Folate level (diagnosis 281.9)

## 2011-05-12 ENCOUNTER — Other Ambulatory Visit: Payer: Medicare PPO

## 2011-05-12 NOTE — Telephone Encounter (Signed)
Call was placed to patient at [phone], she was advised per Sandford Craze instructions. She states that she will have her labs drawn at [facility]

## 2011-05-13 ENCOUNTER — Ambulatory Visit: Payer: 59 | Admitting: Internal Medicine

## 2011-05-18 ENCOUNTER — Encounter: Payer: Self-pay | Admitting: Gastroenterology

## 2011-05-18 ENCOUNTER — Other Ambulatory Visit: Payer: Medicare PPO

## 2011-05-18 ENCOUNTER — Ambulatory Visit (INDEPENDENT_AMBULATORY_CARE_PROVIDER_SITE_OTHER): Payer: Medicare PPO | Admitting: Gastroenterology

## 2011-05-18 VITALS — BP 106/80 | HR 80 | Ht 62.0 in | Wt 129.0 lb

## 2011-05-18 DIAGNOSIS — R197 Diarrhea, unspecified: Secondary | ICD-10-CM

## 2011-05-18 NOTE — Progress Notes (Signed)
Review of pertinent gastrointestinal problems: 1. family history of colon cancer was recommended to have colonoscopy 2010 but never scheduled 2. diarrhea, June 2012; colonoscopy while inpatient by Dr. Marina Goodell described normal terminal ileum, mild left-sided diverticulosis, small tubular adenoma was removed, random colon biopsies were normal except for melanosis. Infectious workup was negative, sedimentation rate and labs were all essentially normal   HPI: This is a very pleasant 65 year old woman whom I last saw about 2 years ago. She is here with her husband today after a brief stay in the hospital for acute diarrhea. See the workup summarized above.  Was having 3 days of acute diarrhea, no bleeding.  Since leaving the hospital 7-8 days ago, she has been fatigued.  The diarrhea is gone now.  Her husband has since then had terrible diarrhea.    Past Medical History:   Anxiety                                                      Asthma                                                       Depression                                                   Hypertension                                                 Hypothyroidism                                               COPD (chronic obstructive pulmonary disease)                 Fibromyalgia                                                 Hyperlipidemia                                               Arrhythmia                                                     Comment: AVNRT   Anemia  Past Surgical History:   APPENDECTOMY                                                 ABDOMINAL HYSTERECTOMY                                       NASAL SINUS SURGERY                             1993         CARDIAC ELECTROPHYSIOLOGY MAPPING AND ABLATION  08/28/2010      Comment:EPS/RFA of AVNRT  - Dr. Sharrell Ku   reports that she quit smoking about 17 months ago. Her smoking use included Cigarettes. She  has never used smokeless tobacco. She reports that she does not drink alcohol or use illicit drugs.  family history includes Breast cancer in her paternal aunt; Colon cancer (age of onset:64) in her sister; Dementia in her mother; Diabetes in her mother; Skin cancer in her father; Stomach cancer in her paternal aunt; and Stroke in her mother.    Current medicines and allergies were reviewed in [facility]    Physical Exam: BP 106/80  Pulse 80  Ht 5\' 2"  (1.575 m)  Wt 129 lb (58.514 kg)  BMI 23.59 kg/m2 Constitutional: generally well-appearing Psychiatric: alert and oriented x3 Abdomen: soft, nontender, nondistended, no obvious ascites, no peritoneal signs, normal bowel sounds     Assessment and plan: 65 y.o. female with resolved acute diarrheal illness.   Her husband came down with the same symptoms she had shortly after she left the hospital and so I suspect that she indeed had an acute infectious diarrheal illness. Most likely viral. Her bowels are back to normal but she is still quite fatigued. She tells me labs done by her primary care office show that she was anemic however I see no record of that in the electronic medical record. She will be getting a repeat colonoscopy for polyp surveillance in 5 years and she knows to call here sooner if she has trouble with her bowels again.

## 2011-05-18 NOTE — Patient Instructions (Signed)
We will get records from Dr. Olegario Messier office regarding your anemia (all the labs in EPIC system show NO anemia). Call back to Dr. Christella Hartigan' office if your symptoms.  A copy of this information will be made available to Dr. Artist Pais. Recall colonoscopy in 5 years (already set up) for polyp surveillance.

## 2011-05-18 NOTE — Assessment & Plan Note (Signed)
BP Readings from Last 3 Encounters:  05/18/11 106/80  05/10/11 150/78  04/13/11 140/78  BP is much lower this visit.  I discussed with her that her swelling is likely due to her hypothyroidism and that getting her thyroid under control will help the most.

## 2011-05-18 NOTE — Assessment & Plan Note (Signed)
Pt instructed to increase her lexapro to 10 mg.

## 2011-05-18 NOTE — Assessment & Plan Note (Signed)
Discussed with patient the importance of compliance with med recommendations. (TSH 26) Will switch to branded synthroid . Plan f/u TSH in 6 weeks.

## 2011-05-19 ENCOUNTER — Telehealth: Payer: Self-pay

## 2011-05-19 NOTE — Telephone Encounter (Signed)
Dr Christella Hartigan Dr Olegario Messier office has no other labs but what is in EPIC.

## 2011-05-20 NOTE — Telephone Encounter (Signed)
Ok, thanks.

## 2011-05-25 ENCOUNTER — Ambulatory Visit: Payer: 59 | Admitting: Gastroenterology

## 2011-06-07 ENCOUNTER — Ambulatory Visit (INDEPENDENT_AMBULATORY_CARE_PROVIDER_SITE_OTHER): Payer: Medicare PPO | Admitting: Family

## 2011-06-07 ENCOUNTER — Encounter: Payer: Self-pay | Admitting: Family

## 2011-06-07 VITALS — BP 130/74 | HR 90 | Temp 97.8°F | Resp 16 | Ht 62.0 in | Wt 124.0 lb

## 2011-06-07 DIAGNOSIS — F341 Dysthymic disorder: Secondary | ICD-10-CM

## 2011-06-07 DIAGNOSIS — D539 Nutritional anemia, unspecified: Secondary | ICD-10-CM

## 2011-06-07 DIAGNOSIS — IMO0001 Reserved for inherently not codable concepts without codable children: Secondary | ICD-10-CM

## 2011-06-07 DIAGNOSIS — E039 Hypothyroidism, unspecified: Secondary | ICD-10-CM

## 2011-06-07 DIAGNOSIS — M797 Fibromyalgia: Secondary | ICD-10-CM

## 2011-06-07 LAB — TSH: TSH: 4.738 u[IU]/mL — ABNORMAL HIGH (ref 0.350–4.500)

## 2011-06-07 LAB — FOLATE: Folate: 15.2 ng/mL

## 2011-06-07 MED ORDER — LORAZEPAM 0.5 MG PO TABS: 0.5000 mg | ORAL_TABLET | Freq: Two times a day (BID) | ORAL | Status: DC | PRN

## 2011-06-07 MED ORDER — FLUTICASONE PROPIONATE 50 MCG/ACT NA SUSP: 2.0000 | Freq: Every day | NASAL | Status: DC

## 2011-06-07 MED ORDER — PANTOPRAZOLE SODIUM 40 MG PO TBEC: 40.0000 mg | DELAYED_RELEASE_TABLET | Freq: Every day | ORAL | Status: DC

## 2011-06-07 MED ORDER — DULOXETINE HCL 60 MG PO CPEP: 60.0000 mg | ORAL_CAPSULE | Freq: Every day | ORAL | Status: DC

## 2011-06-07 MED ORDER — BECLOMETHASONE DIPROPIONATE 80 MCG/ACT IN AERS: 2.0000 | INHALATION_SPRAY | Freq: Two times a day (BID) | RESPIRATORY_TRACT | Status: DC

## 2011-06-07 MED ORDER — MORPHINE SULFATE CR 30 MG PO TB12: 30.0000 mg | ORAL_TABLET | Freq: Two times a day (BID) | ORAL | Status: DC

## 2011-06-07 MED ORDER — CLONIDINE HCL 0.1 MG PO TABS: ORAL_TABLET | ORAL | Status: DC

## 2011-06-07 MED ORDER — DICYCLOMINE HCL 10 MG PO CAPS: 10.0000 mg | ORAL_CAPSULE | Freq: Three times a day (TID) | ORAL | Status: DC

## 2011-06-07 MED ORDER — ALBUTEROL SULFATE HFA 108 (90 BASE) MCG/ACT IN AERS: 2.0000 | INHALATION_SPRAY | Freq: Two times a day (BID) | RESPIRATORY_TRACT | Status: DC

## 2011-06-07 NOTE — Assessment & Plan Note (Signed)
We'll check followup TSH today.

## 2011-06-07 NOTE — Patient Instructions (Signed)
You will be contacted about your pain clinic referral.  Follow up in 1 month.

## 2011-06-07 NOTE — Assessment & Plan Note (Signed)
She appears somewhat depressed still she was tearful today. She is currently on Lexapro. She is complaining about worsening fibromyalgia pain. Will discontinue Lexapro and transition her to Cymbalta to see if this helps with both her depression and her fibromyalgia symptoms. We have also discussed that I will refer her to pain management for further ongoing treatment of her chronic pain. I will prescribe her pain medications in the meantime.

## 2011-06-07 NOTE — Progress Notes (Signed)
Subjective:    Patient ID: Joann Ray, female    DOB: 01-14-1946, 65 y.o.   MRN: [id number]  HPI  Joann Ray is a 65 yr old female who presents today for follow up.  1) chronic pain- reports that she has chronic pain due to her FM- She is on MS Contin.  She notes recent exacerbation of her pain.  2) hypothyroidism-she reports that she has been compliant with her levothyroxin. She has been taking it in the evenings however due to 2 recent hospitalization of her husband.  3) history of macrocytic anemia, she is requesting B12 and folate levels to be drawn.    Review of Systems    see history of present illness  Past Medical History  Diagnosis Date  . Anxiety   . Asthma   . Depression   . Hypertension   . Hypothyroidism   . COPD (chronic obstructive pulmonary disease)   . Fibromyalgia   . Hyperlipidemia   . Arrhythmia      AVNRT  . Anemia     History   Social History  . Marital Status: Married    Spouse Name: N/A    Number of Children: 3  . Years of Education: N/A   Occupational History  . DISABLED    Social History Main Topics  . Smoking status: Former Smoker    Types: Cigarettes    Quit date: 11/29/2009  . Smokeless tobacco: Never Used   Comment: September 2011  . Alcohol Use: No  . Drug Use: No  . Sexually Active: Not on file   Other Topics Concern  . Not on file   Social History Narrative  . No narrative on file    Past Surgical History  Procedure Date  . Appendectomy   . Abdominal hysterectomy   . Nasal sinus surgery 1993  . Cardiac electrophysiology mapping and ablation 08/28/2010    EPS/RFA of AVNRT  - Dr. Sharrell Ku    Family History  Problem Relation Age of Onset  . Colon cancer Sister 71  . Diabetes Mother   . Skin cancer Father   . Breast cancer Paternal Aunt   . Stomach cancer Paternal Aunt   . Stroke Mother   . Dementia Mother     Allergies  Allergen Reactions  . Penicillins     Current Outpatient  Prescriptions on File Prior to Visit  Medication Sig Dispense Refill  . cetirizine (ZYRTEC) 10 MG tablet Take 5 mg by mouth 2 (two) times daily.        . hydrochlorothiazide 25 MG tablet Take 1 tablet (25 mg total) by mouth daily.  30 tablet  1  . KLONOPIN 0.5 MG tablet Take 0.5 mg by mouth At bedtime as needed.      . ondansetron (ZOFRAN) 4 MG tablet Take 4 mg by mouth every 6 (six) hours as needed.        . saccharomyces boulardii (FLORASTOR) 250 MG capsule Take 250 mg by mouth 2 (two) times daily.        Marland Kitchen SYNTHROID 125 MCG tablet Take 1 tablet (125 mcg total) by mouth daily.  30 tablet  1  . DISCONTD: albuterol (PROAIR HFA) 108 (90 BASE) MCG/ACT inhaler Inhale 2 puffs into the lungs 2 (two) times daily.        Marland Kitchen DISCONTD: beclomethasone (QVAR) 80 MCG/ACT inhaler Inhale 2 puffs into the lungs 2 (two) times daily.        Marland Kitchen DISCONTD: cloNIDine (CATAPRES) 0.1  MG tablet 1/2 tablet twice daily  30 tablet  2  . DISCONTD: dicyclomine (BENTYL) 10 MG capsule Take 10 mg by mouth 4 (four) times daily -  before meals and at bedtime.        Marland Kitchen DISCONTD: escitalopram (LEXAPRO) 10 MG tablet Take 1 tablet (10 mg total) by mouth daily.  30 tablet  2  . DISCONTD: fluticasone (FLONASE) 50 MCG/ACT nasal spray 2 sprays by Nasal route daily.  16 g  3  . DISCONTD: morphine (MS CONTIN) 30 MG 12 hr tablet Take 1 tablet (30 mg total) by mouth 2 (two) times daily.  60 tablet  0  . DISCONTD: oxazepam (SERAX) 10 MG capsule Take 1 capsule (10 mg total) by mouth 2 (two) times daily as needed.  60 capsule  0  . DISCONTD: pantoprazole (PROTONIX) 40 MG tablet Take 1 tablet (40 mg total) by mouth daily.  30 tablet  1    BP 130/74  Pulse 90  Temp(Src) 97.8 F (36.6 C) (Oral)  Resp 16  Ht 5\' 2"  (1.575 m)  Wt 124 lb 0.6 oz (56.264 kg)  BMI 22.69 kg/m2    Objective:   Physical Exam  Constitutional: She appears well-developed and well-nourished.  Cardiovascular: Normal rate and regular rhythm.   Pulmonary/Chest: Effort  normal and breath sounds normal.  Psychiatric:       Tearful at times during our interview. Otherwise she is calm and pleasant and appropriate.          Assessment & Plan:

## 2011-06-07 NOTE — Assessment & Plan Note (Signed)
DC Lexapro start Cymbalta, see above discussion. She is requesting a that we discontinue oxazepam and try a cheaper alternative. We'll try Ativan.

## 2011-06-08 ENCOUNTER — Telehealth: Payer: Self-pay | Admitting: Family

## 2011-06-08 ENCOUNTER — Ambulatory Visit: Payer: Medicare PPO | Admitting: Family

## 2011-06-08 DIAGNOSIS — E039 Hypothyroidism, unspecified: Secondary | ICD-10-CM

## 2011-06-08 MED ORDER — SYNTHROID 137 MCG PO TABS: 137.0000 ug | ORAL_TABLET | Freq: Every day | ORAL | Status: DC

## 2011-06-08 NOTE — Telephone Encounter (Signed)
Pt notified. Lab order has been entered for the week of 07/20/11 and forwarded to the lab.

## 2011-06-08 NOTE — Telephone Encounter (Signed)
Pls call pt and let her know b12 and folate levels are normal.  Thyroid test is very improved- almost to goal. I would like her to increase her thyroid medication to and repeat TSH in 6 weeks.

## 2011-06-14 ENCOUNTER — Encounter: Payer: Medicare PPO | Admitting: Neurosurgery

## 2011-06-24 ENCOUNTER — Telehealth: Payer: Self-pay | Admitting: *Deleted

## 2011-06-24 NOTE — Telephone Encounter (Signed)
Pt called stating that she has tried Lorazepam for 2 weeks and it seems to make her more nervous and anxious. She wants to change back to Oxazepam. Please advise.

## 2011-06-25 MED ORDER — OXAZEPAM 10 MG PO CAPS: 10.0000 mg | ORAL_CAPSULE | Freq: Two times a day (BID) | ORAL | Status: DC | PRN

## 2011-06-25 NOTE — Telephone Encounter (Signed)
Pls call pt and let her know that I have sent rx for oxazepam to her pharmacy.  She should stop klonopin and lorazepam.

## 2011-06-25 NOTE — Telephone Encounter (Signed)
Notified pt per instructions below. Pt states she takes Klonopin to help her sleep and does not want to stop it. Advised pt per Hughie Closs that she feels Klonopin is too much to take on top of the Oxazepam. Pt states that this is the only thing that helps her sleep because of her thyroid medication and states she will discuss it with Melissa at her next follow up.

## 2011-07-05 ENCOUNTER — Ambulatory Visit (INDEPENDENT_AMBULATORY_CARE_PROVIDER_SITE_OTHER): Payer: Medicare PPO | Admitting: Internal Medicine

## 2011-07-05 ENCOUNTER — Encounter: Payer: Self-pay | Admitting: Internal Medicine

## 2011-07-05 DIAGNOSIS — E039 Hypothyroidism, unspecified: Secondary | ICD-10-CM

## 2011-07-05 DIAGNOSIS — IMO0001 Reserved for inherently not codable concepts without codable children: Secondary | ICD-10-CM

## 2011-07-05 DIAGNOSIS — F341 Dysthymic disorder: Secondary | ICD-10-CM

## 2011-07-05 MED ORDER — CLONAZEPAM 0.5 MG PO TABS: 0.5000 mg | ORAL_TABLET | Freq: Every evening | ORAL | Status: DC | PRN

## 2011-07-05 MED ORDER — LEVOTHYROXINE SODIUM 125 MCG PO TABS: 125.0000 ug | ORAL_TABLET | Freq: Every day | ORAL | Status: DC

## 2011-07-05 MED ORDER — ESCITALOPRAM OXALATE 10 MG PO TABS: 10.0000 mg | ORAL_TABLET | Freq: Every day | ORAL | Status: DC

## 2011-07-05 MED ORDER — MORPHINE SULFATE CR 30 MG PO TB12: 30.0000 mg | ORAL_TABLET | Freq: Two times a day (BID) | ORAL | Status: DC

## 2011-07-05 NOTE — Progress Notes (Signed)
  Subjective:    Patient ID: Joann Ray, female    DOB: July 06, 1946, 65 y.o.   MRN: [id number]  HPI Pt presents to clinic for evaluation of multiple medical problems. Has felt poorly since increase of synthroid from to 137 including worsening insomnia.takes klonopin qhs prn for insomnia. Attempted cymbalta but did not tolerate. Currently off lexapro and cymbalta. H/o fibromyalgia maintained currently on stable dose of ms contin without evidence of addiction or tolerance. Considering possible rheumatology referral. No other complaints. Total time of visit ~ 28 minutes of which >50% spent in counseling.  Reviewed pmh, medications and allergies    Review of Systems  Musculoskeletal: Positive for myalgias. Negative for arthralgias.  Psychiatric/Behavioral: Positive for sleep disturbance. The patient is nervous/anxious.        Objective:   Physical Exam  Nursing note and vitals reviewed. Constitutional: She appears well-developed and well-nourished. No distress.  HENT:  Head: Normocephalic and atraumatic.  Eyes: Conjunctivae are normal. No scleral icterus.  Neurological: She is alert.  Skin: She is not diaphoretic.  Psychiatric: She has a normal mood and affect.          Assessment & Plan:

## 2011-07-05 NOTE — Assessment & Plan Note (Signed)
RF ms contin. Aware of potential for addiction and/or tolerance. Considering rheumatology referral.

## 2011-07-05 NOTE — Assessment & Plan Note (Signed)
Hyperthyroid sx's with dose increase. Resume po qd. Recheck tsh/ft4 in ~10 wks.

## 2011-07-05 NOTE — Assessment & Plan Note (Signed)
?  intolerant of cymbalta. Resume lexapro qd.

## 2011-07-05 NOTE — Patient Instructions (Signed)
Please schedule tsh and free t4 in 10 wks (hypothyroidism)

## 2011-07-13 ENCOUNTER — Encounter: Payer: Medicare PPO | Admitting: Neurosurgery

## 2011-07-22 ENCOUNTER — Telehealth: Payer: Self-pay | Admitting: Family

## 2011-07-22 MED ORDER — OXAZEPAM 10 MG PO CAPS: 10.0000 mg | ORAL_CAPSULE | Freq: Two times a day (BID) | ORAL | Status: DC | PRN

## 2011-07-22 NOTE — Telephone Encounter (Signed)
ok 

## 2011-07-22 NOTE — Telephone Encounter (Signed)
[phone] no refills.

## 2011-07-22 NOTE — Telephone Encounter (Signed)
Refill- oxazepam 10mg  capsule. Take one capsule by mouth twice a day as needed. Qty 60. Last fill 7.27.12

## 2011-07-22 NOTE — Telephone Encounter (Signed)
Medication last filled 06/25/11. Pt has follow up 10/06/11. Please advise re: refill.

## 2011-07-26 ENCOUNTER — Telehealth: Payer: Self-pay | Admitting: *Deleted

## 2011-07-26 NOTE — Telephone Encounter (Signed)
Call returned to patient [phone], she was informed per Dr Rodena Medin instruction

## 2011-07-26 NOTE — Telephone Encounter (Signed)
Patient called and left voice message stating she was seen by Efraim Kaufmann, and an appointment for the pain clinic was arranged. Patient has requested to speak with Dr Rodena Medin to see if she still needed to keep the appointment with pain management.

## 2011-07-26 NOTE — Telephone Encounter (Signed)
Think its a good idea bc of the ms contin

## 2011-07-27 ENCOUNTER — Encounter: Payer: Self-pay | Admitting: Internal Medicine

## 2011-07-27 ENCOUNTER — Ambulatory Visit (INDEPENDENT_AMBULATORY_CARE_PROVIDER_SITE_OTHER): Payer: Medicare PPO | Admitting: Internal Medicine

## 2011-07-27 DIAGNOSIS — G8929 Other chronic pain: Secondary | ICD-10-CM

## 2011-07-27 DIAGNOSIS — J40 Bronchitis, not specified as acute or chronic: Secondary | ICD-10-CM | POA: Insufficient documentation

## 2011-07-27 MED ORDER — CHLORPHENIRAMINE-HYDROCODONE 8-10 MG/5ML PO LQCR: 5.0000 mL | Freq: Two times a day (BID) | ORAL | Status: DC | PRN

## 2011-07-27 MED ORDER — DOXYCYCLINE HYCLATE 100 MG PO TABS: 100.0000 mg | ORAL_TABLET | Freq: Two times a day (BID) | ORAL | Status: AC

## 2011-07-27 NOTE — Assessment & Plan Note (Signed)
Stable. Pain clinic appt pending. Will continue to fill ms contin until appt.

## 2011-07-27 NOTE — Progress Notes (Signed)
  Subjective:    Patient ID: Joann Ray, female    DOB: [birth year], 65 y.o.   MRN: [id number]  HPI Pt presents to clinic as a work in for evaluation of cough. Notes 1.5 wk h/o facial sinus pressure, yellow nasal drainage, congestion and cough. No fever, chills, sick exposure, dyspnea or wheezing. Cough worse at night. No exacerbating or alleviating factors. H/o chronic pain maintained on ms contin with pain clinic appt pending. No other complaints.  Past Medical History  Diagnosis Date  . Anxiety   . Asthma   . Depression   . Hypertension   . Hypothyroidism   . COPD (chronic obstructive pulmonary disease)   . Fibromyalgia   . Hyperlipidemia   . Arrhythmia      AVNRT  . Anemia    Past Surgical History  Procedure Date  . Appendectomy   . Abdominal hysterectomy   . Nasal sinus surgery 1993  . Cardiac electrophysiology mapping and ablation 08/28/2010    EPS/RFA of AVNRT  - Dr. Sharrell Ku    reports that she quit smoking about 19 months ago. Her smoking use included Cigarettes. She has never used smokeless tobacco. She reports that she does not drink alcohol or use illicit drugs. family history includes Breast cancer in her paternal aunt; Colon cancer (age of onset:64) in her sister; Dementia in her mother; Diabetes in her mother; Skin cancer in her father; Stomach cancer in her paternal aunt; and Stroke in her mother. Allergies  Allergen Reactions  . Penicillins      Review of Systems see hpi     Objective:   Physical Exam  Nursing note and vitals reviewed. Constitutional: She appears well-developed and well-nourished. No distress.  HENT:  Head: Normocephalic and atraumatic.  Right Ear: Tympanic membrane, external ear and ear canal normal.  Left Ear: Tympanic membrane, external ear and ear canal normal.  Nose: Nose normal.  Mouth/Throat: Oropharynx is clear and moist. No oropharyngeal exudate.  Eyes: Conjunctivae are normal. Right eye exhibits no discharge. Left  eye exhibits no discharge. No scleral icterus.  Neck: Neck supple.  Cardiovascular: Normal rate, regular rhythm and normal heart sounds.  Exam reveals no gallop and no friction rub.   No murmur heard. Pulmonary/Chest: Effort normal and breath sounds normal. No respiratory distress. She has no wheezes. She has no rales.  Lymphadenopathy:    She has no cervical adenopathy.  Neurological: She is alert.  Skin: Skin is warm and dry. She is not diaphoretic.  Psychiatric: She has a normal mood and affect.          Assessment & Plan:

## 2011-07-27 NOTE — Assessment & Plan Note (Signed)
Begin abx tx. Attempt tussionex prn cough and cautioned regarding possible sedating effect. Followup if no improvement or worsening.

## 2011-07-30 NOTE — H&P (Signed)
Joann Ray, [name] NO.:  [id number]  MEDICAL RECORD NO.:  [id number]  LOCATION:  1515                         FACILITY:  [facility]  PHYSICIAN:  Wilhemina Bonito. Marina Goodell, MD      DATE OF BIRTH:  Jun 19, 1946  DATE OF ADMISSION:  05/06/2011 DATE OF DISCHARGE:                             HISTORY & PHYSICAL   PROBLEMS:  Diarrhea, rectal bleeding, and abdominal pain.  HISTORY OF PRESENT ILLNESS:  Joann Ray is a pleasant 65 year old female known to Dr. Christella Hartigan from prior evaluation in 2010, when she had complained of intermittent diarrhea and hematochezia.  She was to have a colonoscopy but subsequently developed problems with SVT, had subsequent ablation, etc., and did not come back for colonoscopy.  The patient has multiple other medical problems including significant hypothyroidism which has been difficult to manage due to intolerance to medications with GI side effects.  She also has chronic pain syndrome secondary to fibromyalgia and is on chronic MS Contin.  She has chronic anxiety and depression and history of COPD.  At this time, she presents with onset of diarrhea about a month ago, having several bowel movements today, which has been mucoid and has intermittently seen bright red blood mixed with her stools.  She also complains of lower abdominal cramping and nausea.  She has been seen by Dr. Olegario Messier nurse practitioner and was set up for CT of the abdomen and pelvis which was done on Apr 15, 2011, and was negative.  Her symptoms improved somewhat without any specific therapy and then recurred late last week and have progressively worsened over the past 48 hours.  She is, at this point, having diarrhea q.2 hours, complains of a foul odor, urgency, and has had a few episodes of incontinence over the past 24 hours.  She has not had any associated fever, has had some cramping in her lower abdomen, decreased appetite, nausea without vomiting.  She has not had any significant  weight loss.  She feels weak and was dizzy and lightheaded this morning.  She had called the on-call physician and was advised to come to the emergency room.  She was seen and evaluated in the ER.  At this time, admitted to observation for further diagnostic evaluation and hydration.  She did take an antibiotic last in February of 2012.  She has not been on any other new medications.  Apparently, she had been referred to [facility] at one point for her hypothyroidism as she has had difficulty long term tolerating levothyroxine.  MEDICATIONS: 1. Qvar 80 2 puffs b.i.d. 2. Klonopin 0.2 at bedtime. 3. Catapres 0.1 half tablet b.i.d. 4. Lexapro 5 daily. 5. Levothyroxine 100 mcg daily. 6. Oxazepam 10 mg b.i.d. 7. Zantac 75 p.r.n. 8. MS Contin 30 b.i.d.  ALLERGIES:  PENICILLIN.  FAMILY HISTORY:  Pertinent for a sister with colorectal cancer.  SOCIAL HISTORY:  The patient is married, retired, disabled.  No tobacco. No EtOH.  REVIEW OF SYSTEMS:  CONSTITUTIONAL:  Pertinent for weakness, dizziness. No weight loss.  CARDIOVASCULAR:  Denies any chest pain or anginal symptoms.  PULMONARY:  Denies any cough, shortness of breath, or sputum production.  GI:  As above.  GENITOURINARY:  Denies any dysuria, urgency, or frequency.  MUSCULOSKELETAL:  Positive for chronic problems with pain secondary to fibromyalgia.  NEUROPSYCH:  Positive for chronic anxiety.  All other review of systems is negative, except as in HPI.  LABORATORY DATA:  Pending on admission.  PHYSICAL EXAMINATION:  GENERAL:  Well-developed white female in no acute distress.  She is alert and oriented x3. VITAL SIGNS:  Blood pressure 167/81, pulse 52, respirations 16, temperature 98.2. HEENT:  Atraumatic and normocephalic.  EOMI.  PERLA.  Sclerae anicteric. NECK:  Supple.  There is no JVD. CARDIOVASCULAR:  Regular rate and rhythm, with S1 and S2.  No murmur, rub, or gallop. PULMONARY:  Clear to A and P. ABDOMEN:  Soft.  Bowel  sounds are active.  She is nondistended.  She has mild diffuse abdominal tenderness.  No guarding.  No rebound.  No mass or hepatosplenomegaly. RECTAL:  There is no stool in the vault.  Mucus is heme negative. EXTREMITIES:  No clubbing, cyanosis, or edema. SKIN:  Benign. NEUROLOGIC:  Alert and oriented x3.  Exam grossly nonfocal. PSYCHIATRIC:  Mood is anxious but otherwise appropriate.  IMPRESSION: 8. A 65 year old female with one month history of diarrhea, abdominal     cramping, nausea, and intermittent hematochezia.  Symptoms improved     about 2 weeks ago without any specific therapy and then recurred 5-     6 days ago and have been progressing since.  Etiology is not clear.     Rule out infection, i.e., Clostridium difficile, rule out irritable     bowel disease, rule out symptoms secondary to adverse reaction to     levothyroxine. 2. Hypothyroidism. 3. History of supraventricular tachycardia, status post ablation. 4. Chronic anxiety. 5. Fibromyalgia. 6. Chronic pain syndrome, on MS Contin. 7. Positive family history of colon cancer. 8. Chronic obstructive pulmonary disease.  PLAN:  The patient is admitted to the service of Dr. Yancey Flemings for IV fluid hydration.  We will obtain stool studies, baseline labs.  She will be supported symptomatically.  If her cultures are negative, will plan colonoscopy on this admit.  For details, please see orders.     Amy Esterwood, PA-C   ______________________________ Wilhemina Bonito. Marina Goodell, MD    AE/MEDQ  D:  05/07/2011  T:  05/07/2011  Job:  [id number]  cc:   Rachael Fee, MD [address] [place], [place] [address]  Electronically Signed by Mike Gip PA-C on 05/25/2011 09:18:23 AM Electronically Signed by Yancey Flemings MD on 07/30/2011 11:03:30 AM

## 2011-07-30 NOTE — Discharge Summary (Signed)
Joann Ray, Joann Ray NO.:  [id number]  MEDICAL RECORD NO.:  [id number]  LOCATION:  1515                         FACILITY:  [facility]  PHYSICIAN:  Wilhemina Bonito. Marina Goodell, MD      DATE OF BIRTH:  [birth year]  DATE OF ADMISSION:  05/06/2011 DATE OF DISCHARGE:  05/07/2011                              DISCHARGE SUMMARY   ADMITTING DIAGNOSES: 66. A 65 year old female with 17-month history of diarrhea progressive     over the past 5 days, abdominal cramping, nausea and intermittent     hematochezia, etiology not clear, rule out infectious i.e.     Clostridium difficile colitis, rule out inflammatory bowel disease,     rule out symptoms secondary to adverse side effects from     levothyroxine 2. Hypothyroidism. 3. History of supraventricular tachycardia status post ablation. 4. Chronic anxiety. 5. Fibromyalgia. 6. Chronic pain syndrome, on MS Contin 7. Positive family history of colon cancer. 8. Chronic obstructive pulmonary disease.  DISCHARGE DIAGNOSES: 1. Acute/subacute diarrheal illness associated with abdominal     cramping, nausea and intermittent hematochezia.  The patient's     stool studies are negative including stool for Clostridium     difficile by PCR and colonoscopy done this admission is negative.     She did have one small polyp removed, path is pending as are random     biopsies to rule out a microscopic colitis. 2. Hypothyroidism. 3. History of supraventricular tachycardia status post ablation. 4. Chronic anxiety. 5. Fibromyalgia. 6. Chronic pain syndrome, on MS Contin 7. Positive family history of colon cancer. 8. Chronic obstructive pulmonary disease.  CONSULTATIONS:  None.  PROCEDURES:  Colonoscopy per Dr. Marina Goodell on May 07, 2011.  BRIEF HISTORY:  Joann Ray is a pleasant 65 year old female primary patient of Dr. Cathie Hoops, known to Dr. Christella Hartigan from evaluation in 2010.  She had complained of diarrhea and intermittent hematochezia at that time and was to have  colonoscopy.  However, due to other health issues, she did not come back for the colonoscopy and this has not been completed.  She had several other medical problems one of which being hypothyroidism which has been difficult to manage due to intolerance to medications with multiple side effects.  She also has fibromyalgia with chronic pain syndrome, chronic anxiety, depression and COPD.  At this time she presents with 39-month history of diarrhea with multiple bowel movements per day, intermittent bright red blood mixed with her stools.  She has complained of lower abdominal cramping and nausea.  CT scan of the abdomen and pelvis on Apr 15, 2011, was negative.  She had some spontaneous improvement in her symptoms which then recurred about 5 days ago and have been progressive since.  The evening before admission, she was up all night with diarrhea, urgency, incontinence and developed associated weakness and dizziness and therefore presented to the ER.  LABORATORY STUDIES:  On May 06, 2011, WBC of 7.7, hemoglobin of 13.8, hematocrit 41.6, MCV of 92, platelets 314.  Sed rate 22.  Electrolytes within normal limits.  BUN 8, creatinine 0.63.  Liver function studies normal.  CRP normal at 0.1.  Stool for C diff by PCR negative.  Stool for occult blood negative.  Stool  for lactoferrin negative, O and P negative and cultures pending.  X-RAY STUDIES:  None this admission.  BRIEF COURSE:  The patient was admitted to the service of Dr. Yancey Flemings.  She was placed on IV fluid hydration, clear liquid diet, we covered her briefly with IV metronidazole.  She had stool studies done including stool for C diff by PCR which returned negative on the day of admission.  We then scheduled her for colonoscopy which was done on the 8th per Dr. Marina Goodell.  She tolerated the prep and the procedure without difficulty with conscious sedation.  She was found to have a normal terminal ileum.  She had a diminutive polyp in  the descending colon which was removed, mild diverticulosis and otherwise normal colonoscopy. Random biopsies were done.  At this time, the patient has felt stable for discharge to home.  DISCHARGE INSTRUCTIONS:  Her discharged instructions are to follow up with Dr. Christella Hartigan on May 18, 2011, at 1:30 p.m. Call for any problems in the interim.  She is to maintain all of her usual home medications and in addition have added: 1. Bentyl 10 mg of t.i.d. p.r.n. 2. Zofran 4 mg q.6 h p.r.n. nausea. 3. Florastor one p.o. twice daily.  Usual home meds include: 1. Albuterol inhaler 1 to 2 puffs q.4 h p.r.n. 2. Clonazepam 0.5 q.h.s. 3. Clonidine 0.1 half tablet daily. 4. Flonase nasal spray 2 sprays daily. 5. Hydrochlorothiazide 12.5 mg daily. 6. Lexapro 5 mg daily. 7. MS Contin 30 mg twice daily. 8. Oxazepam 10 mg twice daily. 9. Levothyroxine 100 mcg daily. 10.Zantac 75 daily as needed. 11.Zyrtec as needed.  CONDITION ON DISCHARGE:  Stable and improved.     Amy Esterwood, PA-C   ______________________________ Wilhemina Bonito. Marina Goodell, MD    AE/MEDQ  D:  05/07/2011  T:  05/07/2011  Job:  [id number]  Electronically Signed by AMY ESTERWOOD PA-C on 05/25/2011 09:18:11 AM Electronically Signed by Yancey Flemings MD on 07/30/2011 11:03:25 AM

## 2011-08-03 ENCOUNTER — Encounter: Payer: Medicare PPO | Admitting: Neurosurgery

## 2011-08-04 ENCOUNTER — Encounter: Payer: Self-pay | Admitting: Internal Medicine

## 2011-08-04 ENCOUNTER — Ambulatory Visit (INDEPENDENT_AMBULATORY_CARE_PROVIDER_SITE_OTHER): Payer: Medicare PPO | Admitting: Internal Medicine

## 2011-08-04 DIAGNOSIS — IMO0001 Reserved for inherently not codable concepts without codable children: Secondary | ICD-10-CM

## 2011-08-04 DIAGNOSIS — F341 Dysthymic disorder: Secondary | ICD-10-CM

## 2011-08-04 DIAGNOSIS — G4700 Insomnia, unspecified: Secondary | ICD-10-CM

## 2011-08-04 DIAGNOSIS — E039 Hypothyroidism, unspecified: Secondary | ICD-10-CM

## 2011-08-04 MED ORDER — MORPHINE SULFATE CR 30 MG PO TB12: 30.0000 mg | ORAL_TABLET | Freq: Two times a day (BID) | ORAL | Status: DC

## 2011-08-04 MED ORDER — CLONAZEPAM 0.5 MG PO TABS: ORAL_TABLET | ORAL | Status: DC

## 2011-08-04 NOTE — Progress Notes (Signed)
Subjective:    Patient ID: Joann Ray, female    DOB: May 23, 1946, 65 y.o.   MRN: [id number]  HPI  65 y/o female with complex hx of chronic fibromyalgia, hypothyroidism, anxiety / depression for follow up. Since prev visit, pt was seen by NP and Dr. Rodena Medin.  She was tried on cymbalta but stopped due to side effects.  "It made me feel like climbing walls".  She was restarted on Lexapro.  She has chronic issues with insomnia despite her current dose of clonazepam 0.5 mg.    She has had difficulty tolerating thyroid medications for years. She has been severely hypothyroid for years and has only recently been able to tolerate her current dose of thyroid replacement of 125 mcg.  she has been taking her thyroid medication at bedtime. She does this because thyroid medication makes her feel tired initially but  later makes her feel wired and contributes to her insomnia.  She also takes Lexapro at bedtime.  Interval history: She has had additional life stressors. Her husband was hospitalized with gangrenous gallbladder.  This exacerbated her anxiety and insomnia as well.  She was transitioned off of hydrocodone which she was taking for years for fibromyalgia pain to MS Contin 30 mg bid early in 2012.  She was referred to pain management by nurse practitioner about decided against referral until we discussed other treatment options for her fibromyalgia pain.  Her level of pain has been worse than usual.  She wakes up feeling shaky and weak. Her symptoms exacerbated after taking her thyroid medication.  Review of Systems Intermittent diarrhea-previous workup including colonoscopy and random biopsies were unrevealing.    Past Medical History  Diagnosis Date  . Anxiety   . Asthma   . Depression   . Hypertension   . Hypothyroidism   . COPD (chronic obstructive pulmonary disease)   . Fibromyalgia   . Hyperlipidemia   . Arrhythmia      AVNRT  . Anemia     History   Social History  .  Marital Status: Married    Spouse Name: N/A    Number of Children: 3  . Years of Education: N/A   Occupational History  . DISABLED    Social History Main Topics  . Smoking status: Former Smoker    Types: Cigarettes    Quit date: 11/29/2009  . Smokeless tobacco: Never Used   Comment: September 2011  . Alcohol Use: No  . Drug Use: No  . Sexually Active: Not on file   Other Topics Concern  . Not on file   Social History Narrative  . No narrative on file    Past Surgical History  Procedure Date  . Appendectomy   . Abdominal hysterectomy   . Nasal sinus surgery 1993  . Cardiac electrophysiology mapping and ablation 08/28/2010    EPS/RFA of AVNRT  - Dr. Sharrell Ku    Family History  Problem Relation Age of Onset  . Colon cancer Sister 65  . Diabetes Mother   . Skin cancer Father   . Breast cancer Paternal Aunt   . Stomach cancer Paternal Aunt   . Stroke Mother   . Dementia Mother     Allergies  Allergen Reactions  . Penicillins     Current Outpatient Prescriptions on File Prior to Visit  Medication Sig Dispense Refill  . albuterol (PROAIR HFA) 108 (90 BASE) MCG/ACT inhaler Inhale 2 puffs into the lungs 2 (two) times daily.  1 Inhaler  3  .  chlorpheniramine-hydrocodone (TUSSIONEX) 8-10 MG/5ML suspension Take 5 mLs by mouth every 12 (twelve) hours as needed for cough.  120 mL  0  . cloNIDine (CATAPRES) 0.1 MG tablet 1/2 tablet twice daily  30 tablet  3  . doxycycline (VIBRA-TABS) 100 MG tablet Take 1 tablet (100 mg total) by mouth 2 (two) times daily.  20 tablet  0  . escitalopram (LEXAPRO) 10 MG tablet Take 1 tablet (10 mg total) by mouth daily.  30 tablet  6  . hydrochlorothiazide 25 MG tablet Take 1 tablet (25 mg total) by mouth daily.  30 tablet  1  . levothyroxine (SYNTHROID) 125 MCG tablet Take 1 tablet (125 mcg total) by mouth daily.  30 tablet  11  . oxazepam (SERAX) 10 MG capsule Take 1 capsule (10 mg total) by mouth 2 (two) times daily as needed for sleep  or anxiety.  60 capsule  0  . pantoprazole (PROTONIX) 40 MG tablet Take 1 tablet (40 mg total) by mouth daily.  30 tablet  3  . saccharomyces boulardii (FLORASTOR) 250 MG capsule Take 250 mg by mouth 2 (two) times daily.        . cetirizine (ZYRTEC) 10 MG tablet Take 5 mg by mouth 2 (two) times daily.        Marland Kitchen dicyclomine (BENTYL) 10 MG capsule Take 10 mg by mouth 3 (three) times daily.          BP 130/80  Pulse 64  Temp(Src) 98.2 F (36.8 C) (Oral)  Wt 124 lb (56.246 kg)       Objective:   Physical Exam   Constitutional: Appears well-developed and well-nourished. No distress.  Head: Normocephalic and atraumatic.  Right Ear: External ear normal.  Left Ear: External ear normal.  Mouth/Throat: Oropharynx is clear and moist.  Eyes: Conjunctivae are normal. Pupils are equal, round, and reactive to light.  Neck: Normal range of motion. Neck supple. No thyromegaly present. No carotid bruit Cardiovascular: Normal rate, regular rhythm and normal heart sounds.  Exam reveals no gallop and no friction rub.   No murmur heard. Pulmonary/Chest: Effort normal and breath sounds normal.  No wheezes. No rales.  Neurological: Alert. No cranial nerve deficit.  Skin: Skin is warm and dry.  Psychiatric: Normal mood and affect. Behavior is normal.        Assessment & Plan:

## 2011-08-04 NOTE — Assessment & Plan Note (Addendum)
Pt was able to transition from years of taking vicodin to MsContin.  I would like to pursue slowly weaning patient from narcotic medication. I agree with rheumatology referral.  Refer to Dr. Corliss Skains. I suspect poor sleep is exacerbating her pain.  Increase clonazepam dose at bedtime to 0.75 mg.  Refilled MS contin.    Consider slowly introducing lyrica while tapering MS contin.

## 2011-08-04 NOTE — Assessment & Plan Note (Addendum)
She is almost euthyroid.  She is very sensitive to her thyroid medication.  I suggest her thyroid medication affects metabolism of narcotics.  Trial of splitting thyroid medication dose.  Maintain current dose for now. Lab Results  Component Value Date   TSH 4.738* 06/07/2011

## 2011-08-04 NOTE — Patient Instructions (Signed)
Split dosing of thyroid medication - take 1/2 in AM and other 1/2 in afternoon. Take Lexapro in the morning.

## 2011-08-04 NOTE — Assessment & Plan Note (Signed)
Increase clonazepam to 0.75 mg.

## 2011-08-04 NOTE — Assessment & Plan Note (Signed)
I suggest pt take her lexapro in AM.  She complains of racing thoughts at night.  I doubt bipolar.  I suspect her symptoms secondary to taking her thyroid medication at bedtime.

## 2011-08-17 ENCOUNTER — Ambulatory Visit (INDEPENDENT_AMBULATORY_CARE_PROVIDER_SITE_OTHER): Payer: Medicare PPO | Admitting: Internal Medicine

## 2011-08-17 ENCOUNTER — Encounter: Payer: Self-pay | Admitting: Internal Medicine

## 2011-08-17 DIAGNOSIS — J329 Chronic sinusitis, unspecified: Secondary | ICD-10-CM

## 2011-08-17 MED ORDER — LEVOFLOXACIN 500 MG PO TABS: 500.0000 mg | ORAL_TABLET | Freq: Every day | ORAL | Status: AC

## 2011-08-17 NOTE — Assessment & Plan Note (Signed)
Treatment failure with doxy Levaquin 500 mg daily x 10 days Also use nasal saline irrigation Patient advised to call office if symptoms persist or worsen.

## 2011-08-17 NOTE — Patient Instructions (Signed)
Gargle with warm salt water  2 x daily (1 tsp of salt in 2 cups of warm water) Please call our office if your symptoms do not improve or gets worse.

## 2011-08-17 NOTE — Progress Notes (Signed)
  Subjective:    Patient ID: Joann Ray, female    DOB: Apr 15, 1946, 64 y.o.   MRN: [id number]  Sinus Problem Associated symptoms include congestion, coughing, headaches and sinus pressure.  Sinusitis This is a chronic problem. The current episode started 1 to 4 weeks ago. The problem has been gradually worsening since onset. There has been no fever. The pain is mild. Associated symptoms include congestion, coughing, headaches and sinus pressure. Past treatments include antibiotics. The treatment provided no relief.   She is allergic to PCN.  She was treated with doxy.    Review of Systems  HENT: Positive for congestion and sinus pressure.   Respiratory: Positive for cough.   Neurological: Positive for headaches.       Objective:   Physical Exam   Constitutional: Appears well-developed and well-nourished. No distress.  Head: Normocephalic and atraumatic.  Ear:  Right and Left TM retracted Mouth/Throat: Oropharynx red.  Post nasal gtt signs Eyes: Conjunctivae are normal. Pupils are equal, round, and reactive to light.  Neck: Normal range of motion. Neck supple. No thyromegaly present. No carotid bruit Cardiovascular: Normal rate, regular rhythm and normal heart sounds.  Exam reveals no gallop and no friction rub.   No murmur heard. Pulmonary/Chest: Effort normal and breath sounds normal.  Scattered wheezing Neurological: Alert. No cranial nerve deficit.  Skin: Skin is warm and dry.  Psychiatric: Normal mood and affect. Behavior is normal.       Assessment & Plan:

## 2011-08-20 ENCOUNTER — Telehealth: Payer: Self-pay | Admitting: Family

## 2011-08-20 NOTE — Telephone Encounter (Signed)
Refill- oxazepam 10mg  capsule. Take one capsule by mouth twice a day as needed. Qty 60 last fill 8.23.12

## 2011-08-23 NOTE — Telephone Encounter (Signed)
Ok to refill x 2  

## 2011-08-23 NOTE — Telephone Encounter (Signed)
Pt called to check on status of refill for Oxazepam 10 mg caps #60. Call in to [facility].

## 2011-08-24 MED ORDER — OXAZEPAM 10 MG PO CAPS: 10.0000 mg | ORAL_CAPSULE | Freq: Two times a day (BID) | ORAL | Status: AC | PRN

## 2011-08-24 NOTE — Telephone Encounter (Signed)
rx called in

## 2011-08-24 NOTE — Telephone Encounter (Signed)
Pt is aware doc will refill med

## 2011-08-30 ENCOUNTER — Other Ambulatory Visit: Payer: Medicare PPO

## 2011-09-01 ENCOUNTER — Ambulatory Visit (INDEPENDENT_AMBULATORY_CARE_PROVIDER_SITE_OTHER): Payer: Medicare PPO | Admitting: Internal Medicine

## 2011-09-01 ENCOUNTER — Encounter: Payer: Self-pay | Admitting: Internal Medicine

## 2011-09-01 DIAGNOSIS — E039 Hypothyroidism, unspecified: Secondary | ICD-10-CM

## 2011-09-01 DIAGNOSIS — IMO0001 Reserved for inherently not codable concepts without codable children: Secondary | ICD-10-CM

## 2011-09-01 DIAGNOSIS — G4700 Insomnia, unspecified: Secondary | ICD-10-CM

## 2011-09-01 MED ORDER — LEVOTHYROXINE SODIUM 137 MCG PO TABS: ORAL_TABLET | ORAL | Status: DC

## 2011-09-01 MED ORDER — ONDANSETRON HCL 4 MG PO TABS: 4.0000 mg | ORAL_TABLET | Freq: Three times a day (TID) | ORAL | Status: AC | PRN

## 2011-09-01 MED ORDER — MORPHINE SULFATE CR 30 MG PO TB12: 30.0000 mg | ORAL_TABLET | Freq: Two times a day (BID) | ORAL | Status: AC

## 2011-09-01 MED ORDER — DICYCLOMINE HCL 10 MG PO CAPS: 10.0000 mg | ORAL_CAPSULE | Freq: Three times a day (TID) | ORAL | Status: DC

## 2011-09-01 MED ORDER — MORPHINE SULFATE CR 30 MG PO TB12: ORAL_TABLET | ORAL | Status: DC

## 2011-09-01 NOTE — Assessment & Plan Note (Signed)
Improved with slightly higher dose of clonazepam

## 2011-09-01 NOTE — Progress Notes (Signed)
Subjective:    Patient ID: Joann Ray, female    DOB: 1945/12/21, 65 y.o.   MRN: [id number]  HPI  65 y/o female for follow up.  Her sinusitis symptoms are much improved.   She has changed dosing of her thyroid medication and lexapro.  She is experiencing less side effects from thyroid medication.  Fibromyalgia - stable on current dose of ms contin.  She understands overall plan to eventually wean pt off narcotic medication.  Sleep improved on slightly higher dose of clonazepam.  Pt planning to have major oral surgery within next several months.  Dentist would like for [place] to handle pain meds.  She complains of periodic flares of IBS.  She use bentyl and zofran as needed.  Review of Systems Negative for palpitations,  Negative for constipation  Past Medical History  Diagnosis Date  . Anxiety   . Asthma   . Depression   . Hypertension   . Hypothyroidism   . COPD (chronic obstructive pulmonary disease)   . Fibromyalgia   . Hyperlipidemia   . Arrhythmia      AVNRT  . Anemia     History   Social History  . Marital Status: Married    Spouse Name: N/A    Number of Children: 3  . Years of Education: N/A   Occupational History  . DISABLED    Social History Main Topics  . Smoking status: Former Smoker    Types: Cigarettes    Quit date: 11/29/2009  . Smokeless tobacco: Never Used   Comment: September 2011  . Alcohol Use: No  . Drug Use: No  . Sexually Active: Not on file   Other Topics Concern  . Not on file   Social History Narrative  . No narrative on file    Past Surgical History  Procedure Date  . Appendectomy   . Abdominal hysterectomy   . Nasal sinus surgery 1993  . Cardiac electrophysiology mapping and ablation 08/28/2010    EPS/RFA of AVNRT  - Dr. Sharrell Ku    Family History  Problem Relation Age of Onset  . Colon cancer Sister 53  . Diabetes Mother   . Skin cancer Father   . Breast cancer Paternal Aunt   . Stomach cancer Paternal  Aunt   . Stroke Mother   . Dementia Mother     Allergies  Allergen Reactions  . Penicillins     Current Outpatient Prescriptions on File Prior to Visit  Medication Sig Dispense Refill  . albuterol (PROAIR HFA) 108 (90 BASE) MCG/ACT inhaler Inhale 2 puffs into the lungs 2 (two) times daily.  1 Inhaler  3  . cetirizine (ZYRTEC) 10 MG tablet Take 5 mg by mouth 2 (two) times daily.        . clonazePAM (KLONOPIN) 0.5 MG tablet Take one and half or 0.75 mg at bedtime  45 tablet  2  . cloNIDine (CATAPRES) 0.1 MG tablet 1/2 tablet twice daily  30 tablet  3  . escitalopram (LEXAPRO) 10 MG tablet Take 1 tablet (10 mg total) by mouth daily.  30 tablet  6  . hydrochlorothiazide 25 MG tablet Take 1 tablet (25 mg total) by mouth daily.  30 tablet  1  . oxazepam (SERAX) 10 MG capsule Take 1 capsule (10 mg total) by mouth 2 (two) times daily as needed for sleep or anxiety.  60 capsule  2  . pantoprazole (PROTONIX) 40 MG tablet Take 1 tablet (40 mg total) by mouth  daily.  30 tablet  3  . saccharomyces boulardii (FLORASTOR) 250 MG capsule Take 250 mg by mouth 2 (two) times daily.          BP 102/72  Pulse 60  Temp(Src) 98.2 F (36.8 C) (Oral)  Wt 122 lb (55.339 kg)       Objective:   Physical Exam   Constitutional: Appears well-developed and well-nourished. No distress.  Cardiovascular: Normal rate, regular rhythm and normal heart sounds.  Exam reveals no gallop and no friction rub.   No murmur heard. Pulmonary/Chest: Effort normal and breath sounds normal.  No wheezes. No rales.  Neurological: Alert. No cranial nerve deficit.  Skin: Skin is warm and dry.  Psychiatric: Normal mood and affect. Behavior is normal.       Assessment & Plan:

## 2011-09-01 NOTE — Assessment & Plan Note (Signed)
Pt tolerating thyroid meds better with split dosing.  Increase to 137 mcg

## 2011-09-01 NOTE — Patient Instructions (Signed)
Please complete the following lab tests before your next follow up appointment: TSH - 244.9 

## 2011-09-01 NOTE — Assessment & Plan Note (Signed)
Stable.  She is having major oral surgery within the next 2-3 months.  We will be handling pain meds.  I suggest use of percocet 5/325 bid for breakthrough pain.

## 2011-09-28 ENCOUNTER — Ambulatory Visit (INDEPENDENT_AMBULATORY_CARE_PROVIDER_SITE_OTHER): Payer: Medicare PPO

## 2011-09-28 ENCOUNTER — Telehealth: Payer: Self-pay | Admitting: *Deleted

## 2011-09-28 DIAGNOSIS — Z23 Encounter for immunization: Secondary | ICD-10-CM

## 2011-09-28 NOTE — Telephone Encounter (Signed)
Pt is scheduled to have dental surgery on 10/05/11.  She said that Dr Artist Pais offered to give her Percocet 5/325 and she was wondering if she could go ahead and get the rx.

## 2011-09-29 MED ORDER — OXYCODONE-ACETAMINOPHEN 7.5-325 MG PO TABS: 1.0000 | ORAL_TABLET | Freq: Two times a day (BID) | ORAL | Status: DC | PRN

## 2011-09-29 NOTE — Telephone Encounter (Signed)
Please print rx for signature  Generic percocet 5/325 mg one po bid prn  # 60 for breakthrough pain. Post date rx for 11/5

## 2011-09-29 NOTE — Telephone Encounter (Signed)
Pt p/u rx.

## 2011-10-06 ENCOUNTER — Ambulatory Visit: Payer: Medicare PPO | Admitting: Internal Medicine

## 2011-10-19 ENCOUNTER — Other Ambulatory Visit: Payer: Self-pay | Admitting: *Deleted

## 2011-10-19 MED ORDER — DICYCLOMINE HCL 10 MG PO CAPS: 10.0000 mg | ORAL_CAPSULE | Freq: Three times a day (TID) | ORAL | Status: DC

## 2011-11-01 ENCOUNTER — Ambulatory Visit (INDEPENDENT_AMBULATORY_CARE_PROVIDER_SITE_OTHER): Payer: Medicare PPO | Admitting: Internal Medicine

## 2011-11-01 DIAGNOSIS — K1379 Other lesions of oral mucosa: Secondary | ICD-10-CM | POA: Insufficient documentation

## 2011-11-01 DIAGNOSIS — E039 Hypothyroidism, unspecified: Secondary | ICD-10-CM

## 2011-11-01 DIAGNOSIS — K1370 Unspecified lesions of oral mucosa: Secondary | ICD-10-CM

## 2011-11-01 DIAGNOSIS — IMO0001 Reserved for inherently not codable concepts without codable children: Secondary | ICD-10-CM

## 2011-11-01 MED ORDER — OXYCODONE-ACETAMINOPHEN 10-325 MG PO TABS: 1.0000 | ORAL_TABLET | Freq: Two times a day (BID) | ORAL | Status: AC | PRN

## 2011-11-01 MED ORDER — MORPHINE SULFATE CR 30 MG PO TB12: ORAL_TABLET | ORAL | Status: DC

## 2011-11-01 NOTE — Progress Notes (Signed)
Subjective:    Patient ID: Joann Ray, female    DOB: Oct 28, 1946, 65 y.o.   MRN: [id number]  HPI  65 year old white female with history of fibromyalgia, hypothyroidism for followup. Interval history-patient has had significant oral surgery. She is in the process of getting dental implants. Her dental surgery has been associated with significant discomfort and pain. She is currently on liquid/soft diet. She was told by her dentist that she can expect oral discomfort/pain for the next 3 months.  She has been on ms contin for significant fibromyalgia pain. She was prescribed Percocet for breakthrough pain associated with oral surgery. Patient reports persistent pain despite current dose of Percocet.  Hypothyroidism - she resumed taking thyroid medication at bedtime.  She reports thyroid medication makes her feel drowsy.  Review of Systems Negative for constipation  Past Medical History  Diagnosis Date  . Anxiety   . Asthma   . Depression   . Hypertension   . Hypothyroidism   . COPD (chronic obstructive pulmonary disease)   . Fibromyalgia   . Hyperlipidemia   . Arrhythmia      AVNRT  . Anemia     History   Social History  . Marital Status: Married    Spouse Name: N/A    Number of Children: 3  . Years of Education: N/A   Occupational History  . DISABLED    Social History Main Topics  . Smoking status: Former Smoker    Types: Cigarettes    Quit date: 11/29/2009  . Smokeless tobacco: Never Used   Comment: September 2011  . Alcohol Use: No  . Drug Use: No  . Sexually Active: Not on file   Other Topics Concern  . Not on file   Social History Narrative  . No narrative on file    Past Surgical History  Procedure Date  . Appendectomy   . Abdominal hysterectomy   . Nasal sinus surgery 1993  . Cardiac electrophysiology mapping and ablation 08/28/2010    EPS/RFA of AVNRT  - Dr. Sharrell Ku    Family History  Problem Relation Age of Onset  . Colon cancer  Sister 17  . Diabetes Mother   . Skin cancer Father   . Breast cancer Paternal Aunt   . Stomach cancer Paternal Aunt   . Stroke Mother   . Dementia Mother     Allergies  Allergen Reactions  . Penicillins     Current Outpatient Prescriptions on File Prior to Visit  Medication Sig Dispense Refill  . albuterol (PROAIR HFA) 108 (90 BASE) MCG/ACT inhaler Inhale 2 puffs into the lungs 2 (two) times daily.  1 Inhaler  3  . cetirizine (ZYRTEC) 10 MG tablet Take 5 mg by mouth 2 (two) times daily.        . clonazePAM (KLONOPIN) 0.5 MG tablet Take one and half or 0.75 mg at bedtime  45 tablet  2  . cloNIDine (CATAPRES) 0.1 MG tablet 1/2 tablet twice daily  30 tablet  3  . dicyclomine (BENTYL) 10 MG capsule Take 1 capsule (10 mg total) by mouth 3 (three) times daily.  90 capsule  3  . escitalopram (LEXAPRO) 10 MG tablet Take 1 tablet (10 mg total) by mouth daily.  30 tablet  6  . hydrochlorothiazide 25 MG tablet Take 1 tablet (25 mg total) by mouth daily.  30 tablet  1  . levothyroxine (SYNTHROID) 137 MCG tablet 1/2 tab by mouth 2 x daily  30 tablet  2  .  ondansetron (ZOFRAN) 4 MG tablet Take 4 mg by mouth every 6 (six) hours.        . pantoprazole (PROTONIX) 40 MG tablet Take 1 tablet (40 mg total) by mouth daily.  30 tablet  3  . saccharomyces boulardii (FLORASTOR) 250 MG capsule Take 250 mg by mouth 2 (two) times daily.             Objective:   Physical Exam  Constitutional: She is oriented to person, place, and time. She appears well-developed and well-nourished.  HENT:  Head: Normocephalic and atraumatic.  Mouth/Throat: Oropharynx is clear and moist.  Cardiovascular: Normal rate, regular rhythm and normal heart sounds.   Pulmonary/Chest: Effort normal and breath sounds normal. She has no wheezes. She has no rales.  Neurological: She is oriented to person, place, and time.  Skin: Skin is warm and dry.  Psychiatric: She has a normal mood and affect. Her behavior is normal.          Assessment & Plan:

## 2011-11-01 NOTE — Assessment & Plan Note (Signed)
Continue MS contin for now.  After oral surgical issues resolved, plan to taper ms contin while starting lyrica.

## 2011-11-01 NOTE — Assessment & Plan Note (Signed)
Monitor TFTs

## 2011-11-01 NOTE — Assessment & Plan Note (Signed)
Patient experiencing significant pain associated with oral surgery. Continue to use Percocet 10/325 twice a day as needed. Dentist expects patient will need pain medications for 3 months.

## 2011-11-15 ENCOUNTER — Telehealth: Payer: Self-pay | Admitting: Internal Medicine

## 2011-11-15 NOTE — Telephone Encounter (Signed)
Refill- clonazepam 0.5mg  tablet. Take 1 and 1/2 tablets by mouth at bedtime. Qty 45 last fill 11.19.12

## 2011-11-15 NOTE — Telephone Encounter (Signed)
Ok to refill x 3 

## 2011-11-16 MED ORDER — CLONAZEPAM 0.5 MG PO TABS: ORAL_TABLET | ORAL | Status: DC

## 2011-11-16 NOTE — Telephone Encounter (Signed)
rx called in

## 2011-12-02 ENCOUNTER — Telehealth: Payer: Self-pay | Admitting: Internal Medicine

## 2011-12-02 NOTE — Telephone Encounter (Signed)
Pt was given rx morphine 30 mg #20. Pt stated she normally gets 60 pills. Pt is aware MD out of office today.

## 2011-12-02 NOTE — Telephone Encounter (Signed)
Please provide correct qty of 60.  #20 was printed in error.  See if one of the other [facility] providers will sign rx.   If not, I will sign on Friday.

## 2011-12-03 MED ORDER — MORPHINE SULFATE CR 30 MG PO TB12: ORAL_TABLET | ORAL | Status: DC

## 2011-12-03 NOTE — Telephone Encounter (Signed)
rx up front ready for p/u, pt aware 

## 2011-12-19 ENCOUNTER — Other Ambulatory Visit: Payer: Self-pay | Admitting: Family

## 2011-12-28 ENCOUNTER — Other Ambulatory Visit: Payer: Self-pay | Admitting: Internal Medicine

## 2011-12-28 NOTE — Telephone Encounter (Signed)
Patient took last pill of this last night. FYI.

## 2011-12-28 NOTE — Telephone Encounter (Signed)
Pt need refill on oxazempam  10mg  call into [facility] [phone]

## 2011-12-29 MED ORDER — OXAZEPAM 10 MG PO CAPS: 10.0000 mg | ORAL_CAPSULE | Freq: Two times a day (BID) | ORAL | Status: AC

## 2011-12-29 NOTE — Telephone Encounter (Signed)
Ok to refill x 2  

## 2011-12-29 NOTE — Telephone Encounter (Signed)
rx called in

## 2012-01-03 ENCOUNTER — Encounter: Payer: Self-pay | Admitting: Internal Medicine

## 2012-01-03 ENCOUNTER — Ambulatory Visit (INDEPENDENT_AMBULATORY_CARE_PROVIDER_SITE_OTHER): Payer: Medicare PPO | Admitting: Internal Medicine

## 2012-01-03 DIAGNOSIS — K1379 Other lesions of oral mucosa: Secondary | ICD-10-CM

## 2012-01-03 DIAGNOSIS — K1370 Unspecified lesions of oral mucosa: Secondary | ICD-10-CM

## 2012-01-03 DIAGNOSIS — E039 Hypothyroidism, unspecified: Secondary | ICD-10-CM

## 2012-01-03 DIAGNOSIS — IMO0001 Reserved for inherently not codable concepts without codable children: Secondary | ICD-10-CM

## 2012-01-03 MED ORDER — OXYCODONE-ACETAMINOPHEN 10-325 MG PO TABS: 1.0000 | ORAL_TABLET | Freq: Two times a day (BID) | ORAL | Status: DC | PRN

## 2012-01-03 MED ORDER — MORPHINE SULFATE CR 30 MG PO TB12: ORAL_TABLET | ORAL | Status: DC

## 2012-01-03 MED ORDER — ONDANSETRON HCL 4 MG PO TABS: 4.0000 mg | ORAL_TABLET | Freq: Four times a day (QID) | ORAL | Status: DC

## 2012-01-03 NOTE — Assessment & Plan Note (Signed)
We discussed referral to Dr. Pamelia Hoit when she ready to start tapering MS Contin.

## 2012-01-03 NOTE — Assessment & Plan Note (Signed)
Percocet holding her dental pain.  She has additional 1.5 months until all surgical procedures are completed.

## 2012-01-03 NOTE — Progress Notes (Signed)
Subjective:    Patient ID: Joann Ray, female    DOB: 05-Mar-1946, 66 y.o.   MRN: [id number]  HPI  A 66 year old white female with history of fibromyalgia, anxiety depression, and hypothyroidism for routine followup. Patient still struggling with multiple oral surgeries. She has ulcerations on her gum there making it painful for her to eat. She has been taking Percocet for breakthrough pain in addition to her maintenance MS Contin. Her pain is manageable. She denies any constipation or excessive somnolence.  Review of Systems Mild weight loss    Past Medical History  Diagnosis Date  . Anxiety   . Asthma   . Depression   . Hypertension   . Hypothyroidism   . COPD (chronic obstructive pulmonary disease)   . Fibromyalgia   . Hyperlipidemia   . Arrhythmia      AVNRT  . Anemia     History   Social History  . Marital Status: Married    Spouse Name: N/A    Number of Children: 3  . Years of Education: N/A   Occupational History  . DISABLED    Social History Main Topics  . Smoking status: Former Smoker    Types: Cigarettes    Quit date: 11/29/2009  . Smokeless tobacco: Never Used   Comment: September 2011  . Alcohol Use: No  . Drug Use: No  . Sexually Active: Not on file   Other Topics Concern  . Not on file   Social History Narrative  . No narrative on file    Past Surgical History  Procedure Date  . Appendectomy   . Abdominal hysterectomy   . Nasal sinus surgery 1993  . Cardiac electrophysiology mapping and ablation 08/28/2010    EPS/RFA of AVNRT  - Dr. Sharrell Ku    Family History  Problem Relation Age of Onset  . Colon cancer Sister 54  . Diabetes Mother   . Skin cancer Father   . Breast cancer Paternal Aunt   . Stomach cancer Paternal Aunt   . Stroke Mother   . Dementia Mother     Allergies  Allergen Reactions  . Penicillins     Current Outpatient Prescriptions on File Prior to Visit  Medication Sig Dispense Refill  . albuterol  (PROAIR HFA) 108 (90 BASE) MCG/ACT inhaler Inhale 2 puffs into the lungs 2 (two) times daily.  1 Inhaler  3  . clonazePAM (KLONOPIN) 0.5 MG tablet Take one and half or 0.75 mg at bedtime  45 tablet  3  . cloNIDine (CATAPRES) 0.1 MG tablet 1/2 tablet twice daily  30 tablet  3  . dicyclomine (BENTYL) 10 MG capsule Take 1 capsule (10 mg total) by mouth 3 (three) times daily.  90 capsule  3  . escitalopram (LEXAPRO) 10 MG tablet Take 1 tablet (10 mg total) by mouth daily.  30 tablet  6  . hydrochlorothiazide (HYDRODIURIL) 25 MG tablet TAKE 1 TABLET BY MOUTH EVERY DAY  30 tablet  1  . levothyroxine (SYNTHROID) 137 MCG tablet 1/2 tab by mouth 2 x daily  30 tablet  2  . oxazepam (SERAX) 10 MG capsule Take 1 capsule (10 mg total) by mouth 2 (two) times daily.  60 capsule  2  . pantoprazole (PROTONIX) 40 MG tablet Take 1 tablet (40 mg total) by mouth daily.  30 tablet  3  . saccharomyces boulardii (FLORASTOR) 250 MG capsule Take 250 mg by mouth 2 (two) times daily.  BP 134/82  Pulse 72  Temp(Src) 98.2 F (36.8 C) (Oral)  Wt 118 lb (53.524 kg)    Objective:   Physical Exam  Constitutional: She is oriented to person, place, and time. She appears well-developed and well-nourished.  Cardiovascular: Normal rate, regular rhythm and normal heart sounds.   Pulmonary/Chest: Effort normal and breath sounds normal.  Neurological: She is alert and oriented to person, place, and time.  Psychiatric: She has a normal mood and affect. Her behavior is normal.          Assessment & Plan:

## 2012-01-03 NOTE — Assessment & Plan Note (Signed)
Pt due to repeat TSH

## 2012-01-06 ENCOUNTER — Other Ambulatory Visit: Payer: Self-pay | Admitting: *Deleted

## 2012-01-06 MED ORDER — LEVOTHYROXINE SODIUM 150 MCG PO TABS: 150.0000 ug | ORAL_TABLET | Freq: Every day | ORAL | Status: DC

## 2012-01-31 ENCOUNTER — Telehealth: Payer: Self-pay | Admitting: Internal Medicine

## 2012-01-31 MED ORDER — OXYCODONE-ACETAMINOPHEN 10-325 MG PO TABS: 1.0000 | ORAL_TABLET | Freq: Two times a day (BID) | ORAL | Status: DC | PRN

## 2012-01-31 NOTE — Telephone Encounter (Signed)
Pt having further oral surgery and requires refill for percocet

## 2012-02-10 ENCOUNTER — Telehealth: Payer: Self-pay | Admitting: Internal Medicine

## 2012-02-10 NOTE — Telephone Encounter (Signed)
Pt would like ov tomorrow for sinus inf. Where can I work pt in at?

## 2012-02-10 NOTE — Telephone Encounter (Signed)
Pt has ov sch for 02-11-2012 145pm

## 2012-02-11 ENCOUNTER — Ambulatory Visit (INDEPENDENT_AMBULATORY_CARE_PROVIDER_SITE_OTHER): Payer: Medicare PPO | Admitting: Internal Medicine

## 2012-02-11 ENCOUNTER — Encounter: Payer: Self-pay | Admitting: Internal Medicine

## 2012-02-11 VITALS — BP 104/62 | Temp 98.0°F | Wt 118.0 lb

## 2012-02-11 DIAGNOSIS — J209 Acute bronchitis, unspecified: Secondary | ICD-10-CM

## 2012-02-11 MED ORDER — AZITHROMYCIN 250 MG PO TABS: ORAL_TABLET | ORAL | Status: AC

## 2012-02-11 MED ORDER — MOMETASONE FURO-FORMOTEROL FUM 100-5 MCG/ACT IN AERO: 2.0000 | INHALATION_SPRAY | Freq: Two times a day (BID) | RESPIRATORY_TRACT | Status: DC

## 2012-02-11 NOTE — Assessment & Plan Note (Addendum)
66 year old white female with signs and  symptoms of bronchitis. Treat with azithromycin 500 mg once daily x3 days. She had difficulty tolerating Levaquin in the past due to GI upset.  Use Dulera for 2-4 weeks.  Sample provided.  Patient advised to call office if symptoms persist or worsen.

## 2012-02-11 NOTE — Progress Notes (Signed)
Subjective:    Patient ID: Joann Ray, female    DOB: 1946/08/26, 66 y.o.   MRN: [id number]  Sinusitis This is a new problem. The current episode started in the past 7 days. The problem has been gradually worsening since onset. There has been no fever. The pain is mild. Associated symptoms include coughing, a hoarse voice and sinus pressure. Past treatments include nothing.      Review of Systems  HENT: Positive for hoarse voice and sinus pressure.   Respiratory: Positive for cough.   intermittent wheezing  Past Medical History  Diagnosis Date  . Anxiety   . Asthma   . Depression   . Hypertension   . Hypothyroidism   . COPD (chronic obstructive pulmonary disease)   . Fibromyalgia   . Hyperlipidemia   . Arrhythmia      AVNRT  . Anemia     History   Social History  . Marital Status: Married    Spouse Name: N/A    Number of Children: 3  . Years of Education: N/A   Occupational History  . DISABLED    Social History Main Topics  . Smoking status: Former Smoker    Types: Cigarettes    Quit date: 11/29/2009  . Smokeless tobacco: Never Used   Comment: September 2011  . Alcohol Use: No  . Drug Use: No  . Sexually Active: Not on file   Other Topics Concern  . Not on file   Social History Narrative  . No narrative on file    Past Surgical History  Procedure Date  . Appendectomy   . Abdominal hysterectomy   . Nasal sinus surgery 1993  . Cardiac electrophysiology mapping and ablation 08/28/2010    EPS/RFA of AVNRT  - Dr. Sharrell Ku    Family History  Problem Relation Age of Onset  . Colon cancer Sister 26  . Diabetes Mother   . Skin cancer Father   . Breast cancer Paternal Aunt   . Stomach cancer Paternal Aunt   . Stroke Mother   . Dementia Mother     Allergies  Allergen Reactions  . Penicillins     Current Outpatient Prescriptions on File Prior to Visit  Medication Sig Dispense Refill  . albuterol (PROAIR HFA) 108 (90 BASE) MCG/ACT  inhaler Inhale 2 puffs into the lungs 2 (two) times daily.  1 Inhaler  3  . clonazePAM (KLONOPIN) 0.5 MG tablet Take one and half or 0.75 mg at bedtime  45 tablet  3  . cloNIDine (CATAPRES) 0.1 MG tablet 1/2 tablet twice daily  30 tablet  3  . dicyclomine (BENTYL) 10 MG capsule Take 1 capsule (10 mg total) by mouth 3 (three) times daily.  90 capsule  3  . escitalopram (LEXAPRO) 10 MG tablet Take 1 tablet (10 mg total) by mouth daily.  30 tablet  6  . hydrochlorothiazide (HYDRODIURIL) 25 MG tablet TAKE 1 TABLET BY MOUTH EVERY DAY  30 tablet  1  . levothyroxine (SYNTHROID, LEVOTHROID) 150 MCG tablet Take 1 tablet (150 mcg total) by mouth daily.  30 tablet  3  . morphine (MS CONTIN) 30 MG 12 hr tablet One tab every 12 hrs  60 tablet  0  . ondansetron (ZOFRAN) 4 MG tablet Take 1 tablet (4 mg total) by mouth every 6 (six) hours.  20 tablet  1  . oxyCODONE-acetaminophen (PERCOCET) 10-325 MG per tablet Take 1 tablet by mouth every 12 (twelve) hours as needed.  60 tablet  0  . pantoprazole (PROTONIX) 40 MG tablet Take 1 tablet (40 mg total) by mouth daily.  30 tablet  3  . saccharomyces boulardii (FLORASTOR) 250 MG capsule Take 250 mg by mouth 2 (two) times daily.          BP 104/62  Temp(Src) 98 F (36.7 C) (Oral)  Wt 118 lb (53.524 kg)        Objective:   Physical Exam  Constitutional: She is oriented to person, place, and time. She appears well-developed and well-nourished.  HENT:  Head: Normocephalic.  Right Ear: External ear normal.  Left Ear: External ear normal.  Mouth/Throat: No oropharyngeal exudate.       Mild oropharyngeal erythema  Neck: Neck supple.  Cardiovascular: Normal rate, regular rhythm and normal heart sounds.   Pulmonary/Chest: Effort normal. She has wheezes.  Lymphadenopathy:    She has no cervical adenopathy.  Neurological: She is alert and oriented to person, place, and time.  Skin: Skin is warm and dry.       Assessment & Plan:

## 2012-02-11 NOTE — Patient Instructions (Signed)
Please call our office if your symptoms do not improve or gets worse.  

## 2012-02-28 ENCOUNTER — Encounter: Payer: Self-pay | Admitting: Internal Medicine

## 2012-02-28 ENCOUNTER — Ambulatory Visit (INDEPENDENT_AMBULATORY_CARE_PROVIDER_SITE_OTHER): Payer: Medicare PPO | Admitting: Internal Medicine

## 2012-02-28 VITALS — BP 124/60 | Temp 97.6°F | Wt 116.0 lb

## 2012-02-28 DIAGNOSIS — K1370 Unspecified lesions of oral mucosa: Secondary | ICD-10-CM

## 2012-02-28 DIAGNOSIS — E039 Hypothyroidism, unspecified: Secondary | ICD-10-CM

## 2012-02-28 DIAGNOSIS — K1379 Other lesions of oral mucosa: Secondary | ICD-10-CM

## 2012-02-28 DIAGNOSIS — N951 Menopausal and female climacteric states: Secondary | ICD-10-CM

## 2012-02-28 LAB — TSH: TSH: 0.69 u[IU]/mL (ref 0.35–5.50)

## 2012-02-28 MED ORDER — CLONAZEPAM 0.5 MG PO TABS: 0.5000 mg | ORAL_TABLET | Freq: Every evening | ORAL | Status: DC | PRN

## 2012-02-28 MED ORDER — MORPHINE SULFATE CR 30 MG PO TB12: ORAL_TABLET | ORAL | Status: DC

## 2012-02-28 MED ORDER — ESTRADIOL 0.0375 MG/24HR TD PTTW: 1.0000 | MEDICATED_PATCH | TRANSDERMAL | Status: DC

## 2012-02-28 MED ORDER — OXYCODONE-ACETAMINOPHEN 10-325 MG PO TABS: 1.0000 | ORAL_TABLET | Freq: Two times a day (BID) | ORAL | Status: DC | PRN

## 2012-02-28 NOTE — Assessment & Plan Note (Signed)
Final oral surgery to be scheduled within the next 2 months. Continue Percocet for breakthrough pain for now.

## 2012-02-28 NOTE — Progress Notes (Signed)
Subjective:    Patient ID: Joann Ray, female    DOB: 1946/07/22, 66 y.o.   MRN: [id number]  HPI  66 year old female with history of fibromyalgia, hypothyroidism and depression for routine followup. Patient still struggling with oral surgery issues. Her final surgery is scheduled within the next 2 months. She is still requiring Percocet for breakthrough pain.  Patient interested in hormone replacement therapy. She has poor sleep patterns and hot flashes. She has remote history of hysterectomy in her 30s. Oophorectomy was not performed.  Review of Systems Negative for weight changes,  Negative for constipation  Past Medical History  Diagnosis Date  . Anxiety   . Asthma   . Depression   . Hypertension   . Hypothyroidism   . COPD (chronic obstructive pulmonary disease)   . Fibromyalgia   . Hyperlipidemia   . Arrhythmia      AVNRT  . Anemia     History   Social History  . Marital Status: Married    Spouse Name: N/A    Number of Children: 3  . Years of Education: N/A   Occupational History  . DISABLED    Social History Main Topics  . Smoking status: Former Smoker    Types: Cigarettes    Quit date: 11/29/2009  . Smokeless tobacco: Never Used   Comment: September 2011  . Alcohol Use: No  . Drug Use: No  . Sexually Active: Not on file   Other Topics Concern  . Not on file   Social History Narrative  . No narrative on file    Past Surgical History  Procedure Date  . Appendectomy   . Abdominal hysterectomy   . Nasal sinus surgery 1993  . Cardiac electrophysiology mapping and ablation 08/28/2010    EPS/RFA of AVNRT  - Dr. Sharrell Ku    Family History  Problem Relation Age of Onset  . Colon cancer Sister 7  . Diabetes Mother   . Skin cancer Father   . Breast cancer Paternal Aunt   . Stomach cancer Paternal Aunt   . Stroke Mother   . Dementia Mother     Allergies  Allergen Reactions  . Penicillins     Current Outpatient Prescriptions on  File Prior to Visit  Medication Sig Dispense Refill  . albuterol (PROAIR HFA) 108 (90 BASE) MCG/ACT inhaler Inhale 2 puffs into the lungs 2 (two) times daily.  1 Inhaler  3  . cloNIDine (CATAPRES) 0.1 MG tablet 1/2 tablet twice daily  30 tablet  3  . dicyclomine (BENTYL) 10 MG capsule Take 1 capsule (10 mg total) by mouth 3 (three) times daily.  90 capsule  3  . escitalopram (LEXAPRO) 10 MG tablet Take 1 tablet (10 mg total) by mouth daily.  30 tablet  6  . hydrochlorothiazide (HYDRODIURIL) 25 MG tablet TAKE 1 TABLET BY MOUTH EVERY DAY  30 tablet  1  . levothyroxine (SYNTHROID, LEVOTHROID) 150 MCG tablet Take 1 tablet (150 mcg total) by mouth daily.  30 tablet  3  . mometasone-formoterol (DULERA) 100-5 MCG/ACT AERO Inhale 2 puffs into the lungs 2 (two) times daily.  8.8 g  0  . ondansetron (ZOFRAN) 4 MG tablet Take 1 tablet (4 mg total) by mouth every 6 (six) hours.  20 tablet  1  . pantoprazole (PROTONIX) 40 MG tablet Take 1 tablet (40 mg total) by mouth daily.  30 tablet  3  . saccharomyces boulardii (FLORASTOR) 250 MG capsule Take 250 mg by mouth 2 (  two) times daily.        Marland Kitchen DISCONTD: clonazePAM (KLONOPIN) 0.5 MG tablet Take 1 tablet (0.5 mg total) by mouth at bedtime as needed for anxiety.  30 tablet  3  . DISCONTD: clonazePAM (KLONOPIN) 0.5 MG tablet Take one and half or 0.75 mg at bedtime  45 tablet  3  . estradiol (VIVELLE-DOT) 0.0375 MG/24HR Place 1 patch onto the skin 2 (two) times a week.  8 patch  3    BP 124/60  Temp(Src) 97.6 F (36.4 C) (Oral)  Wt 116 lb (52.617 kg)       Objective:   Physical Exam  Constitutional: She is oriented to person, place, and time. She appears well-developed and well-nourished.  HENT:  Head: Normocephalic and atraumatic.  Neck: Neck supple.  Cardiovascular: Normal rate, regular rhythm and normal heart sounds.   Pulmonary/Chest: Effort normal and breath sounds normal.  Musculoskeletal: She exhibits no edema.  Lymphadenopathy:    She has no  cervical adenopathy.  Neurological: She is alert and oriented to person, place, and time.  Skin: Skin is warm and dry.  Psychiatric: She has a normal mood and affect. Her behavior is normal.      Assessment & Plan:

## 2012-02-28 NOTE — Assessment & Plan Note (Signed)
Patient experiencing postmenopausal symptoms. We discussed pros and cons of hormone replacement therapy. She had hysterectomy in her 30s. She did not have oophorectomy. Trial of low-dose estradiol transdermal patch.  She wants to proceed despite potential risks.

## 2012-02-28 NOTE — Assessment & Plan Note (Signed)
Monitor TFTs.  She would like to switch to generic levothyroxine if possible.

## 2012-02-29 ENCOUNTER — Other Ambulatory Visit: Payer: Self-pay | Admitting: *Deleted

## 2012-02-29 NOTE — Telephone Encounter (Signed)
Generic synthroid called into pharmacy

## 2012-04-03 ENCOUNTER — Other Ambulatory Visit: Payer: Self-pay | Admitting: *Deleted

## 2012-04-03 MED ORDER — ALBUTEROL SULFATE HFA 108 (90 BASE) MCG/ACT IN AERS: 2.0000 | INHALATION_SPRAY | Freq: Two times a day (BID) | RESPIRATORY_TRACT | Status: DC

## 2012-04-28 ENCOUNTER — Ambulatory Visit (INDEPENDENT_AMBULATORY_CARE_PROVIDER_SITE_OTHER): Payer: Medicare PPO | Admitting: Internal Medicine

## 2012-04-28 ENCOUNTER — Encounter: Payer: Self-pay | Admitting: Internal Medicine

## 2012-04-28 VITALS — BP 138/74 | HR 88 | Temp 98.1°F | Wt 114.0 lb

## 2012-04-28 DIAGNOSIS — I10 Essential (primary) hypertension: Secondary | ICD-10-CM

## 2012-04-28 DIAGNOSIS — K1370 Unspecified lesions of oral mucosa: Secondary | ICD-10-CM

## 2012-04-28 DIAGNOSIS — K1379 Other lesions of oral mucosa: Secondary | ICD-10-CM

## 2012-04-28 DIAGNOSIS — E039 Hypothyroidism, unspecified: Secondary | ICD-10-CM

## 2012-04-28 MED ORDER — CLONIDINE HCL 0.1 MG PO TABS: ORAL_TABLET | ORAL | Status: DC

## 2012-04-28 MED ORDER — OXYCODONE-ACETAMINOPHEN 10-325 MG PO TABS: 1.0000 | ORAL_TABLET | Freq: Two times a day (BID) | ORAL | Status: DC | PRN

## 2012-04-28 MED ORDER — CLONAZEPAM 0.5 MG PO TABS: 0.5000 mg | ORAL_TABLET | Freq: Every evening | ORAL | Status: DC | PRN

## 2012-04-28 MED ORDER — MORPHINE SULFATE 30 MG PO TABS: 30.0000 mg | ORAL_TABLET | Freq: Two times a day (BID) | ORAL | Status: DC

## 2012-04-28 MED ORDER — LEVOTHYROXINE SODIUM 150 MCG PO TABS: 150.0000 ug | ORAL_TABLET | Freq: Every day | ORAL | Status: DC

## 2012-04-28 NOTE — Assessment & Plan Note (Signed)
Stable. No change in medication. BP: 138/74 mmHg

## 2012-04-28 NOTE — Assessment & Plan Note (Addendum)
Patient having ongoing dental issues (dental implants). Continue Percocet for breakthrough pain.

## 2012-04-28 NOTE — Progress Notes (Signed)
Subjective:    Patient ID: Joann Ray, female    DOB: 1945-12-21, 66 y.o.   MRN: [id number]  HPI  66 year old white female with history of hypothyroidism, chronic fibromyalgia, and anxiety/depression for followup. Patient is continuing to have dental issues. She has poor fitting dental implants. She has pain with chewing. She has lost approximately 10 pounds since previous visit.  Her pain is adequately controlled with current dose of MS Contin and Percocet for breakthrough pain.  The patient has history of reaction to her thyroid medication. She has tried splitting her dose without significant improvement. When she takes her thyroid medication in the morning she experiences agitation. She would like to switch to brand name Synthroid.   Review of Systems Positive for weight loss,  Denies constipation  Past Medical History  Diagnosis Date  . Anxiety   . Asthma   . Depression   . Hypertension   . Hypothyroidism   . COPD (chronic obstructive pulmonary disease)   . Fibromyalgia   . Hyperlipidemia   . Arrhythmia      AVNRT  . Anemia     History   Social History  . Marital Status: Married    Spouse Name: N/A    Number of Children: 3  . Years of Education: N/A   Occupational History  . DISABLED    Social History Main Topics  . Smoking status: Former Smoker    Types: Cigarettes    Quit date: 11/29/2009  . Smokeless tobacco: Never Used   Comment: September 2011  . Alcohol Use: No  . Drug Use: No  . Sexually Active: Not on file   Other Topics Concern  . Not on file   Social History Narrative  . No narrative on file    Past Surgical History  Procedure Date  . Appendectomy   . Abdominal hysterectomy   . Nasal sinus surgery 1993  . Cardiac electrophysiology mapping and ablation 08/28/2010    EPS/RFA of AVNRT  - Dr. Sharrell Ku    Family History  Problem Relation Age of Onset  . Colon cancer Sister 81  . Diabetes Mother   . Skin cancer Father   .  Breast cancer Paternal Aunt   . Stomach cancer Paternal Aunt   . Stroke Mother   . Dementia Mother     Allergies  Allergen Reactions  . Penicillins     Current Outpatient Prescriptions on File Prior to Visit  Medication Sig Dispense Refill  . albuterol (PROAIR HFA) 108 (90 BASE) MCG/ACT inhaler Inhale 2 puffs into the lungs 2 (two) times daily.  1 Inhaler  3  . dicyclomine (BENTYL) 10 MG capsule Take 1 capsule (10 mg total) by mouth 3 (three) times daily.  90 capsule  3  . escitalopram (LEXAPRO) 10 MG tablet Take 1 tablet (10 mg total) by mouth daily.  30 tablet  6  . estradiol (VIVELLE-DOT) 0.0375 MG/24HR Place 1 patch onto the skin 2 (two) times a week.  8 patch  3  . hydrochlorothiazide (HYDRODIURIL) 25 MG tablet TAKE 1 TABLET BY MOUTH EVERY DAY  30 tablet  1  . mometasone-formoterol (DULERA) 100-5 MCG/ACT AERO Inhale 2 puffs into the lungs 2 (two) times daily.  8.8 g  0  . ondansetron (ZOFRAN) 4 MG tablet Take 1 tablet (4 mg total) by mouth every 6 (six) hours.  20 tablet  1  . pantoprazole (PROTONIX) 40 MG tablet Take 1 tablet (40 mg total) by mouth daily.  30  tablet  3  . saccharomyces boulardii (FLORASTOR) 250 MG capsule Take 250 mg by mouth 2 (two) times daily.        Marland Kitchen DISCONTD: clonazePAM (KLONOPIN) 0.5 MG tablet Take 1 tablet (0.5 mg total) by mouth at bedtime as needed.  30 tablet  2  . DISCONTD: cloNIDine (CATAPRES) 0.1 MG tablet 1/2 tablet twice daily  30 tablet  3  . DISCONTD: levothyroxine (SYNTHROID, LEVOTHROID) 150 MCG tablet Take 1 tablet (150 mcg total) by mouth daily.  30 tablet  3    BP 138/74  Pulse 88  Temp(Src) 98.1 F (36.7 C) (Oral)  Wt 114 lb (51.71 kg)       Objective:   Physical Exam  Constitutional: She is oriented to person, place, and time.       Thin, pleasant 66 year old female  HENT:  Head: Normocephalic and atraumatic.  Cardiovascular: Normal rate, regular rhythm and normal heart sounds.   Pulmonary/Chest: Effort normal and breath sounds  normal. She has no wheezes.  Musculoskeletal: She exhibits no edema.  Neurological: She is alert and oriented to person, place, and time. No cranial nerve deficit.  Skin: Skin is warm and dry.  Psychiatric: She has a normal mood and affect. Her behavior is normal.          Assessment & Plan:

## 2012-04-28 NOTE — Patient Instructions (Signed)
Please complete the following lab tests before your next follow up appointment: TSH, Free T4, T3 - 244.9

## 2012-04-28 NOTE — Assessment & Plan Note (Signed)
Patient has lost 10 pounds since her previous visit. Unclear if weight loss related to her dental issues versus overtreatment with a replacement. Monitor thyroid function studies before next office visit. Patient requests switch back to brand-name Synthroid. Patient advised to start nutrition supplement (boost twice daily).

## 2012-05-01 ENCOUNTER — Ambulatory Visit: Payer: Medicare PPO | Admitting: Internal Medicine

## 2012-05-01 ENCOUNTER — Other Ambulatory Visit: Payer: Self-pay | Admitting: *Deleted

## 2012-05-01 ENCOUNTER — Other Ambulatory Visit: Payer: Self-pay | Admitting: Internal Medicine

## 2012-05-01 MED ORDER — MORPHINE SULFATE ER 30 MG PO TBCR: 30.0000 mg | EXTENDED_RELEASE_TABLET | Freq: Two times a day (BID) | ORAL | Status: DC

## 2012-05-31 ENCOUNTER — Other Ambulatory Visit: Payer: Self-pay | Admitting: *Deleted

## 2012-05-31 MED ORDER — CLONAZEPAM 0.5 MG PO TABS: 0.7500 mg | ORAL_TABLET | Freq: Every evening | ORAL | Status: DC | PRN

## 2012-06-21 ENCOUNTER — Other Ambulatory Visit (INDEPENDENT_AMBULATORY_CARE_PROVIDER_SITE_OTHER): Payer: 59

## 2012-06-21 DIAGNOSIS — E039 Hypothyroidism, unspecified: Secondary | ICD-10-CM

## 2012-06-21 LAB — TSH: TSH: 0.11 u[IU]/mL — ABNORMAL LOW (ref 0.35–5.50)

## 2012-06-22 ENCOUNTER — Other Ambulatory Visit: Payer: Self-pay | Admitting: Internal Medicine

## 2012-06-22 MED ORDER — SYNTHROID 125 MCG PO TABS: 125.0000 ug | ORAL_TABLET | Freq: Every day | ORAL | Status: DC

## 2012-06-22 NOTE — Progress Notes (Signed)
Quick Note:  Spoke with pt- informed of lab results and change in med - send to [facility] , she will make lab appt when she comes in next week to see dr. Artist Pais ______

## 2012-06-28 ENCOUNTER — Encounter: Payer: Self-pay | Admitting: Internal Medicine

## 2012-06-28 ENCOUNTER — Ambulatory Visit (INDEPENDENT_AMBULATORY_CARE_PROVIDER_SITE_OTHER): Payer: 59 | Admitting: Internal Medicine

## 2012-06-28 VITALS — BP 132/84 | HR 57 | Temp 98.5°F | Wt 114.0 lb

## 2012-06-28 DIAGNOSIS — IMO0001 Reserved for inherently not codable concepts without codable children: Secondary | ICD-10-CM

## 2012-06-28 DIAGNOSIS — K1370 Unspecified lesions of oral mucosa: Secondary | ICD-10-CM

## 2012-06-28 DIAGNOSIS — E039 Hypothyroidism, unspecified: Secondary | ICD-10-CM

## 2012-06-28 DIAGNOSIS — K1379 Other lesions of oral mucosa: Secondary | ICD-10-CM

## 2012-06-28 MED ORDER — OXYCODONE-ACETAMINOPHEN 10-325 MG PO TABS: 1.0000 | ORAL_TABLET | Freq: Two times a day (BID) | ORAL | Status: DC | PRN

## 2012-06-28 MED ORDER — GABAPENTIN 100 MG PO CAPS: ORAL_CAPSULE | ORAL | Status: DC

## 2012-06-28 MED ORDER — ALBUTEROL SULFATE HFA 108 (90 BASE) MCG/ACT IN AERS: 2.0000 | INHALATION_SPRAY | Freq: Two times a day (BID) | RESPIRATORY_TRACT | Status: DC

## 2012-06-28 MED ORDER — ESCITALOPRAM OXALATE 10 MG PO TABS: 10.0000 mg | ORAL_TABLET | Freq: Every day | ORAL | Status: DC

## 2012-06-28 MED ORDER — MORPHINE SULFATE ER 30 MG PO TBCR: 30.0000 mg | EXTENDED_RELEASE_TABLET | Freq: Two times a day (BID) | ORAL | Status: DC

## 2012-06-28 NOTE — Assessment & Plan Note (Signed)
Patient scheduled to have additional oral surgery within the next 1 to 2 weeks. Hopefully this will resolve oral pain. Continue Percocet for breakthrough pain.

## 2012-06-28 NOTE — Assessment & Plan Note (Signed)
TSH was suppressed. Synthroid dose reduced to 125 mcg. Repeat TFTs in 2 months.

## 2012-06-28 NOTE — Assessment & Plan Note (Signed)
We discussed slowly tapering off MS Contin. When she is finished with her oral surgery, patient advised to take MS Contin only once daily. At that time, she is to start gabapentin 100 mg to 200 mg at bedtime.

## 2012-06-28 NOTE — Progress Notes (Signed)
Subjective:    Patient ID: Joann Ray, female    DOB: [birth year], 66 y.o.   MRN: [id number]  HPI  66 year old white female with history of hypothyroidism, fibromyalgia and oral cavity pain for followup. Patient is scheduled to have additional oral surgery within the next one to 2 weeks. He continues to have difficulty chewing foods.  Pain is adequately controlled with Percocet.  Hypothyroidism-her recent TSH was suppressed. Her Synthroid dose was reduced.  Fibromyalgia-patient reports her symptoms waxes and wanes. She may have 2 or 3 weeks when she feels relatively normal. Her husband thinks she can overdo it at times.  Then she'll have 2-3 days of low energy and achiness.  Review of Systems Mild weight loss, she has not started nutritional supplement  Past Medical History  Diagnosis Date  . Anxiety   . Asthma   . Depression   . Hypertension   . Hypothyroidism   . COPD (chronic obstructive pulmonary disease)   . Fibromyalgia   . Hyperlipidemia   . Arrhythmia      AVNRT  . Anemia     History   Social History  . Marital Status: Married    Spouse Name: N/A    Number of Children: 3  . Years of Education: N/A   Occupational History  . DISABLED    Social History Main Topics  . Smoking status: Former Smoker    Types: Cigarettes    Quit date: 11/29/2009  . Smokeless tobacco: Never Used   Comment: September 2011  . Alcohol Use: No  . Drug Use: No  . Sexually Active: Not on file   Other Topics Concern  . Not on file   Social History Narrative  . No narrative on file    Past Surgical History  Procedure Date  . Appendectomy   . Abdominal hysterectomy   . Nasal sinus surgery 1993  . Cardiac electrophysiology mapping and ablation 08/28/2010    EPS/RFA of AVNRT  - Dr. Sharrell Ku    Family History  Problem Relation Age of Onset  . Colon cancer Sister 62  . Diabetes Mother   . Skin cancer Father   . Breast cancer Paternal Aunt   . Stomach cancer  Paternal Aunt   . Stroke Mother   . Dementia Mother     Allergies  Allergen Reactions  . Penicillins     Current Outpatient Prescriptions on File Prior to Visit  Medication Sig Dispense Refill  . clonazePAM (KLONOPIN) 0.5 MG tablet Take 1.5 tablets (0.75 mg total) by mouth at bedtime as needed.  45 tablet  2  . cloNIDine (CATAPRES) 0.1 MG tablet 1/2 tablet twice daily  30 tablet  5  . dicyclomine (BENTYL) 10 MG capsule Take 1 capsule (10 mg total) by mouth 3 (three) times daily.  90 capsule  3  . escitalopram (LEXAPRO) 10 MG tablet Take 1 tablet (10 mg total) by mouth daily.  30 tablet  6  . estradiol (VIVELLE-DOT) 0.0375 MG/24HR Place 1 patch onto the skin 2 (two) times a week.  8 patch  3  . hydrochlorothiazide (HYDRODIURIL) 25 MG tablet TAKE 1 TABLET BY MOUTH EVERY DAY  30 tablet  1  . ondansetron (ZOFRAN) 4 MG tablet Take 1 tablet (4 mg total) by mouth every 6 (six) hours.  20 tablet  1  . SYNTHROID 125 MCG tablet Take 1 tablet (125 mcg total) by mouth daily.  30 tablet  3  . DISCONTD: albuterol (PROAIR HFA) 108 (90  BASE) MCG/ACT inhaler Inhale 2 puffs into the lungs 2 (two) times daily.  1 Inhaler  3  . DISCONTD: pantoprazole (PROTONIX) 40 MG tablet Take 1 tablet (40 mg total) by mouth daily.  30 tablet  3  . gabapentin (NEURONTIN) 100 MG capsule Take 1 to 2 capsules in the evening as directed  60 capsule  2    BP 132/84  Pulse 57  Temp 98.5 F (36.9 C) (Oral)  Wt 114 lb (51.71 kg)       Objective:   Physical Exam  Constitutional: She is oriented to person, place, and time. She appears well-developed and well-nourished.  Cardiovascular: Normal rate, regular rhythm and normal heart sounds.   No murmur heard. Pulmonary/Chest: Effort normal. She has wheezes.  Neurological: She is alert and oriented to person, place, and time.  Skin: Skin is warm and dry.  Psychiatric: She has a normal mood and affect. Her behavior is normal.          Assessment & Plan:

## 2012-07-06 ENCOUNTER — Other Ambulatory Visit: Payer: Self-pay | Admitting: *Deleted

## 2012-07-06 MED ORDER — ONDANSETRON HCL 4 MG PO TABS: 4.0000 mg | ORAL_TABLET | Freq: Four times a day (QID) | ORAL | Status: DC

## 2012-07-28 ENCOUNTER — Telehealth: Payer: Self-pay | Admitting: Internal Medicine

## 2012-07-28 ENCOUNTER — Other Ambulatory Visit: Payer: Self-pay

## 2012-07-28 MED ORDER — OXYCODONE-ACETAMINOPHEN 10-325 MG PO TABS: 1.0000 | ORAL_TABLET | Freq: Two times a day (BID) | ORAL | Status: DC | PRN

## 2012-07-28 NOTE — Telephone Encounter (Signed)
[facility] pharmacy called and said that the pt came to the pharmacy with a script oxyCODONE-acetaminophen (PERCOCET) 10-325 MG per tablet. The script had a don't fill until date on it, but the date was marked out, with the initials R.Yoo DO by it. The script was given back to the pt. Pharmacist wanted to make Dr Artist Pais aware.

## 2012-08-01 NOTE — Telephone Encounter (Signed)
Call pharmacy.  This was approved by our office.  Thank you.

## 2012-08-02 NOTE — Telephone Encounter (Signed)
This was filled at [facility] and there was no issue

## 2012-08-16 ENCOUNTER — Encounter: Payer: Self-pay | Admitting: Cardiology

## 2012-08-16 ENCOUNTER — Ambulatory Visit (INDEPENDENT_AMBULATORY_CARE_PROVIDER_SITE_OTHER): Payer: 59 | Admitting: Internal Medicine

## 2012-08-16 ENCOUNTER — Encounter: Payer: Self-pay | Admitting: Internal Medicine

## 2012-08-16 VITALS — BP 154/78 | Temp 98.1°F | Wt 113.0 lb

## 2012-08-16 DIAGNOSIS — R1032 Left lower quadrant pain: Secondary | ICD-10-CM

## 2012-08-16 DIAGNOSIS — I739 Peripheral vascular disease, unspecified: Secondary | ICD-10-CM | POA: Insufficient documentation

## 2012-08-16 DIAGNOSIS — R0989 Other specified symptoms and signs involving the circulatory and respiratory systems: Secondary | ICD-10-CM

## 2012-08-16 DIAGNOSIS — R109 Unspecified abdominal pain: Secondary | ICD-10-CM

## 2012-08-16 LAB — BASIC METABOLIC PANEL
CO2: 31 mEq/L (ref 19–32)
Calcium: 8.6 mg/dL (ref 8.4–10.5)
Creatinine, Ser: 0.9 mg/dL (ref 0.4–1.2)
GFR: 69.67 mL/min (ref >60.00)
Sodium: 133 mEq/L — ABNORMAL LOW (ref 135–145)

## 2012-08-16 LAB — POCT URINALYSIS DIPSTICK
Ketones, UA: NEGATIVE
Nitrite, UA: NEGATIVE
Protein, UA: NEGATIVE

## 2012-08-16 LAB — CBC WITH DIFFERENTIAL/PLATELET
Basophils Relative: 0.4 % (ref 0.0–3.0)
Eosinophils Relative: 1.2 % (ref 0.0–5.0)
Hemoglobin: 12.9 g/dL (ref 12.0–15.0)
Lymphocytes Relative: 19.6 % (ref 12.0–46.0)
MCHC: 32.7 g/dL (ref 30.0–36.0)
Monocytes Relative: 9.3 % (ref 3.0–12.0)
Neutro Abs: 7.7 10*3/uL (ref 1.4–7.7)
Neutrophils Relative %: 69.5 % (ref 43.0–77.0)
RBC: 4.26 Mil/uL (ref 3.87–5.11)
WBC: 11.1 10*3/uL — ABNORMAL HIGH (ref 4.5–10.5)

## 2012-08-16 MED ORDER — CLONAZEPAM 0.5 MG PO TABS: 0.7500 mg | ORAL_TABLET | Freq: Every evening | ORAL | Status: DC | PRN

## 2012-08-16 MED ORDER — CIPROFLOXACIN HCL 500 MG PO TABS: 500.0000 mg | ORAL_TABLET | Freq: Two times a day (BID) | ORAL | Status: DC

## 2012-08-16 MED ORDER — METRONIDAZOLE 500 MG PO TABS: 500.0000 mg | ORAL_TABLET | Freq: Three times a day (TID) | ORAL | Status: DC

## 2012-08-16 NOTE — Assessment & Plan Note (Signed)
Patient has diminished left radial and brachial pulse.   Her BP in left arm with manual cuff 90/60.  Arrange vascular evaluation.

## 2012-08-16 NOTE — Progress Notes (Signed)
Subjective:    Patient ID: Joann Ray, female    DOB: November 21, 1946, 66 y.o.   MRN: [id number]  HPI  66 year old white female complains of abdominal bloating that started on Sunday.  She them started to feel nauseated and experienced lower abdominal cramping on Monday.  Later on Monday, she developed diarrhea and abdominal discomfort.  Diarrhea seemed to relieve some of the abdominal pressure sensation.  She notes small amount of blood in her urine yesterday which has resolved.  She reports intermittent abdominal cramping. She took some Bentyl which seems to be helping.  She denies severe flank pain.  She does not have any history of kidney stones.     Review of Systems Negative for fever or chills,  No vomiting  Past Medical History  Diagnosis Date  . Anxiety   . Asthma   . Depression   . Hypertension   . Hypothyroidism   . COPD (chronic obstructive pulmonary disease)   . Fibromyalgia   . Hyperlipidemia   . Arrhythmia      AVNRT  . Anemia     History   Social History  . Marital Status: Married    Spouse Name: N/A    Number of Children: 3  . Years of Education: N/A   Occupational History  . DISABLED    Social History Main Topics  . Smoking status: Former Smoker    Types: Cigarettes    Quit date: 11/29/2009  . Smokeless tobacco: Never Used   Comment: September 2011  . Alcohol Use: No  . Drug Use: No  . Sexually Active: Not on file   Other Topics Concern  . Not on file   Social History Narrative  . No narrative on file    Past Surgical History  Procedure Date  . Appendectomy   . Abdominal hysterectomy   . Nasal sinus surgery 1993  . Cardiac electrophysiology mapping and ablation 08/28/2010    EPS/RFA of AVNRT  - Dr. Sharrell Ku    Family History  Problem Relation Age of Onset  . Colon cancer Sister 15  . Diabetes Mother   . Skin cancer Father   . Breast cancer Paternal Aunt   . Stomach cancer Paternal Aunt   . Stroke Mother   . Dementia  Mother     Allergies  Allergen Reactions  . Penicillins     Current Outpatient Prescriptions on File Prior to Visit  Medication Sig Dispense Refill  . albuterol (PROAIR HFA) 108 (90 BASE) MCG/ACT inhaler Inhale 2 puffs into the lungs 2 (two) times daily.  1 Inhaler  3  . cloNIDine (CATAPRES) 0.1 MG tablet 1/2 tablet twice daily  30 tablet  5  . dicyclomine (BENTYL) 10 MG capsule Take 1 capsule (10 mg total) by mouth 3 (three) times daily.  90 capsule  3  . escitalopram (LEXAPRO) 10 MG tablet Take 1 tablet (10 mg total) by mouth daily.  30 tablet  6  . estradiol (VIVELLE-DOT) 0.0375 MG/24HR Place 1 patch onto the skin 2 (two) times a week.  8 patch  3  . gabapentin (NEURONTIN) 100 MG capsule Take 1 to 2 capsules in the evening as directed  60 capsule  2  . hydrochlorothiazide (HYDRODIURIL) 25 MG tablet TAKE 1 TABLET BY MOUTH EVERY DAY  30 tablet  1  . morphine (MS CONTIN) 30 MG 12 hr tablet Take 1 tablet (30 mg total) by mouth 2 (two) times daily.  60 tablet  0  .  ondansetron (ZOFRAN) 4 MG tablet Take 1 tablet (4 mg total) by mouth every 6 (six) hours.  20 tablet  3  . oxyCODONE-acetaminophen (PERCOCET) 10-325 MG per tablet Take 1 tablet by mouth every 12 (twelve) hours as needed.  60 tablet  0  . pantoprazole (PROTONIX) 40 MG tablet Take 40 mg by mouth daily.      Marland Kitchen SYNTHROID 125 MCG tablet Take 1 tablet (125 mcg total) by mouth daily.  30 tablet  3  . DISCONTD: clonazePAM (KLONOPIN) 0.5 MG tablet Take 1.5 tablets (0.75 mg total) by mouth at bedtime as needed.  45 tablet  2    BP 154/78  Temp 98.1 F (36.7 C) (Oral)  Wt 113 lb (51.256 kg)       Objective:   Physical Exam  Constitutional: She is oriented to person, place, and time. She appears well-developed and well-nourished.  HENT:  Head: Normocephalic and atraumatic.  Neck: Neck supple.  Cardiovascular: Normal rate and regular rhythm.   Pulmonary/Chest: Effort normal and breath sounds normal.  Abdominal: Soft. Bowel sounds  are normal.       LLQ tenderness.  Minimal suprapubic tenderness.  Musculoskeletal: She exhibits no edema.  Lymphadenopathy:    She has no cervical adenopathy.  Neurological: She is alert and oriented to person, place, and time. No cranial nerve deficit.  Skin: Skin is warm and dry.  Psychiatric: She has a normal mood and affect. Her behavior is normal.  Diminished left radial pulse and left brachial pulse.     Assessment & Plan:

## 2012-08-16 NOTE — Assessment & Plan Note (Signed)
I suspect her symptoms secondary possible diverticulosis.  Empiric treatment with cipro and flagyl.  Check CBCD, BMET, and sed rate.  Patient advised to call office if symptoms persist or worsen.  Reassess in one week.  If worsening symptoms, obtain CT of Abd and Pelvis.

## 2012-08-16 NOTE — Telephone Encounter (Signed)
error 

## 2012-08-21 ENCOUNTER — Ambulatory Visit (INDEPENDENT_AMBULATORY_CARE_PROVIDER_SITE_OTHER): Payer: 59 | Admitting: Cardiovascular Disease

## 2012-08-21 ENCOUNTER — Encounter: Payer: Self-pay | Admitting: Cardiovascular Disease

## 2012-08-21 VITALS — BP 128/64 | HR 52 | Ht 62.5 in | Wt 119.4 lb

## 2012-08-21 DIAGNOSIS — R42 Dizziness and giddiness: Secondary | ICD-10-CM

## 2012-08-21 DIAGNOSIS — R0989 Other specified symptoms and signs involving the circulatory and respiratory systems: Secondary | ICD-10-CM

## 2012-08-21 NOTE — Patient Instructions (Addendum)
Non-Cardiac CT Angiography (CTA), is a special type of CT scan that uses a computer to produce multi-dimensional views of major blood vessels throughout the body. In CT angiography, a contrast material is injected through an IV to help visualize the blood vessels  Your physician recommends that you schedule a follow-up appointment in: 3-4 WEEKS with Dr Excell Seltzer

## 2012-08-21 NOTE — Progress Notes (Signed)
HPI:  66 year-old woman, long-term smoker, presents for evaluation of an abnormal pulse exam and discord and brachial blood pressures. The patient has noted that her left arm blood pressure has been less than the right for some time now. At the time of her recent office visit, her left arm pulses were markedly diminished and she was referred for vascular evaluation.  The patient has episodes of dizziness, but these are not typically related to exertion or use of the left arm. She has not had syncope or presyncope. She has some pain in the left upper arm with activity, but this has not been lifestyle limiting. She notes a discoloration in her nailbeds, not associated with cold exposure. Sometimes the skin changes extend up the fingers and this mainly affects the right hand. There is associated pain and numbness..   She denies chest pain or pressure. She denies dyspnea, orthopnea, PND, or leg swelling.  Outpatient Encounter Prescriptions as of 08/21/2012  Medication Sig Dispense Refill  . albuterol (PROAIR HFA) 108 (90 BASE) MCG/ACT inhaler Inhale 2 puffs into the lungs 2 (two) times daily.  1 Inhaler  3  . ciprofloxacin (CIPRO) 500 MG tablet Take 1 tablet (500 mg total) by mouth 2 (two) times daily.  20 tablet  0  . clonazePAM (KLONOPIN) 0.5 MG tablet Take 1.5 tablets (0.75 mg total) by mouth at bedtime as needed.  45 tablet  5  . cloNIDine (CATAPRES) 0.1 MG tablet 1/2 tablet twice daily  30 tablet  5  . dicyclomine (BENTYL) 10 MG capsule Take 1 capsule (10 mg total) by mouth 3 (three) times daily.  90 capsule  3  . escitalopram (LEXAPRO) 10 MG tablet Take 1 tablet (10 mg total) by mouth daily.  30 tablet  6  . estradiol (VIVELLE-DOT) 0.0375 MG/24HR Place 1 patch onto the skin 2 (two) times a week.  8 patch  3  . gabapentin (NEURONTIN) 100 MG capsule Take 1 to 2 capsules in the evening as directed  60 capsule  2  . hydrochlorothiazide (HYDRODIURIL) 25 MG tablet TAKE 1 TABLET BY MOUTH EVERY DAY  30  tablet  1  . metroNIDAZOLE (FLAGYL) 500 MG tablet Take 1 tablet (500 mg total) by mouth 3 (three) times daily.  30 tablet  0  . morphine (MS CONTIN) 30 MG 12 hr tablet Take 1 tablet (30 mg total) by mouth 2 (two) times daily.  60 tablet  0  . ondansetron (ZOFRAN) 4 MG tablet Take 1 tablet (4 mg total) by mouth every 6 (six) hours.  20 tablet  3  . oxyCODONE-acetaminophen (PERCOCET) 10-325 MG per tablet Take 1 tablet by mouth every 12 (twelve) hours as needed.  60 tablet  0  . pantoprazole (PROTONIX) 40 MG tablet Take 40 mg by mouth daily.      Marland Kitchen SYNTHROID 125 MCG tablet Take 1 tablet (125 mcg total) by mouth daily.  30 tablet  3    Penicillins  Past Medical History  Diagnosis Date  . Anxiety   . Asthma   . Depression   . Hypertension   . Hypothyroidism   . COPD (chronic obstructive pulmonary disease)   . Fibromyalgia   . Hyperlipidemia   . Arrhythmia      AVNRT  . Anemia     Past Surgical History  Procedure Date  . Appendectomy   . Abdominal hysterectomy   . Nasal sinus surgery 1993  . Cardiac electrophysiology mapping and ablation 08/28/2010  EPS/RFA of AVNRT  - Dr. Sharrell Ku    History   Social History  . Marital Status: Married    Spouse Name: N/A    Number of Children: 3  . Years of Education: N/A   Occupational History  . DISABLED    Social History Main Topics  . Smoking status: Former Smoker    Types: Cigarettes    Quit date: 11/29/2009  . Smokeless tobacco: Never Used   Comment: September 2011  . Alcohol Use: No  . Drug Use: No  . Sexually Active: Not on file   Other Topics Concern  . Not on file   Social History Narrative  . No narrative on file    Family History  Problem Relation Age of Onset  . Colon cancer Sister 23  . Diabetes Mother   . Skin cancer Father   . Breast cancer Paternal Aunt   . Stomach cancer Paternal Aunt   . Stroke Mother   . Dementia Mother     ROS: General: no fevers/chills/night sweats Eyes: no blurry vision,  diplopia, or amaurosis ENT: no sore throat or hearing loss Resp: no cough, wheezing, or hemoptysis CV: no edema or palpitations GI: no abdominal pain, nausea, vomiting, diarrhea, or constipation GU: no dysuria, frequency, or hematuria Skin: no rash Neuro: no headache, numbness, tingling, or weakness of extremities Musculoskeletal: no joint pain or swelling Heme: no bleeding, DVT, or easy bruising Endo: no polydipsia or polyuria  BP 128/64  Pulse 52  Ht 5' 2.5" (1.588 m)  Wt 54.159 kg (119 lb 6.4 oz)  BMI 21.49 kg/m2 Right arm blood pressure 101/60  PHYSICAL EXAM: Pt is alert and oriented, WD, WN, in no distress. HEENT: normal Neck: JVP normal. Carotid upstrokes normal with bilateral bruits left greater than right. No thyromegaly. Lungs: equal expansion, clear bilaterally CV: Apex is discrete and nondisplaced, RRR without murmur or gallop Abd: soft, NT, +BS, no bruit, no hepatosplenomegaly Back: no CVA tenderness Ext: no C/C/E        Femoral pulses 2+= without bruits        DP/PT pulses intact and =        Right brachial and radial pulses are 2+, left brachial and radial pulses are absent Skin: warm and dry without rash Neuro: CNII-XII intact             Strength intact = bilaterally  EKG:  Sinus bradycardia 52 beats per minute, otherwise within normal limits.  ASSESSMENT AND PLAN: 1. Abnormal pulse exam. The patient has absent left brachial and radial pulses. This is associated with discrepant right and left blood pressure readings (low blood pressure on the left). 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 The patient does not have classic left arm claudication, but she does have dizziness and could certainly have subclavian steal. I suspect his problem is long-standing and there is a good chance that the left subclavian artery is occluded with collateral supplying the left arm. I think the best way to evaluate this problem is with CT angiography. This will  also evaluate the carotid arteries and rule out significant carotid stenosis. Further plans and recommendations based on the results of her CT angiogram.  2. Left carotid bruit. As above, CT angiography will be performed. I discussed initiation of antiplatelet therapy with a low-dose aspirin, but the patient tells me that she is unable to take this.  For followup, I would like to see her back in 3-4 weeks after her  CT angiogram of the great vessels is performed.  [name] 08/22/2012 2:38 PM

## 2012-08-23 ENCOUNTER — Other Ambulatory Visit (HOSPITAL_COMMUNITY): Payer: 59

## 2012-08-24 ENCOUNTER — Ambulatory Visit (INDEPENDENT_AMBULATORY_CARE_PROVIDER_SITE_OTHER): Payer: 59 | Admitting: Internal Medicine

## 2012-08-24 ENCOUNTER — Encounter: Payer: Self-pay | Admitting: Internal Medicine

## 2012-08-24 VITALS — BP 122/74 | HR 56 | Temp 98.2°F | Wt 117.0 lb

## 2012-08-24 DIAGNOSIS — R0989 Other specified symptoms and signs involving the circulatory and respiratory systems: Secondary | ICD-10-CM

## 2012-08-24 DIAGNOSIS — IMO0001 Reserved for inherently not codable concepts without codable children: Secondary | ICD-10-CM

## 2012-08-24 MED ORDER — OXYCODONE-ACETAMINOPHEN 10-325 MG PO TABS: 1.0000 | ORAL_TABLET | Freq: Two times a day (BID) | ORAL | Status: DC | PRN

## 2012-08-24 MED ORDER — MORPHINE SULFATE ER 30 MG PO TBCR: 30.0000 mg | EXTENDED_RELEASE_TABLET | Freq: Two times a day (BID) | ORAL | Status: DC

## 2012-08-24 NOTE — Assessment & Plan Note (Signed)
Followed by Dr. Excell Seltzer. They're planning CT angio. 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

## 2012-08-24 NOTE — Progress Notes (Signed)
Subjective:    Patient ID: Joann Ray, female    DOB: [birth year], 66 y.o.   MRN: [id number]  HPI  66 year old white female with history of fibromyalgia, hypothyroidism and peripheral vascular disease for followup. She was seen by vascular specialist-Dr. Excell Seltzer. He is planning to perform a CT scan showed. She definitely has diminished blood pressure in her left arm. He suspects left subclavian stenosis.  She may have subclavian steal.  Fibromyalgia pain-unchanged.  She has not started gabapentin yet.   Review of Systems   Past Medical History  Diagnosis Date  . Anxiety   . Asthma   . Depression   . Hypertension   . Hypothyroidism   . COPD (chronic obstructive pulmonary disease)   . Fibromyalgia   . Hyperlipidemia   . Arrhythmia      AVNRT  . Anemia     History   Social History  . Marital Status: Married    Spouse Name: N/A    Number of Children: 3  . Years of Education: N/A   Occupational History  . DISABLED    Social History Main Topics  . Smoking status: Former Smoker    Types: Cigarettes    Quit date: 11/29/2009  . Smokeless tobacco: Never Used   Comment: September 2011  . Alcohol Use: No  . Drug Use: No  . Sexually Active: Not on file   Other Topics Concern  . Not on file   Social History Narrative  . No narrative on file    Past Surgical History  Procedure Date  . Appendectomy   . Abdominal hysterectomy   . Nasal sinus surgery 1993  . Cardiac electrophysiology mapping and ablation 08/28/2010    EPS/RFA of AVNRT  - Dr. Sharrell Ku    Family History  Problem Relation Age of Onset  . Colon cancer Sister 51  . Diabetes Mother   . Skin cancer Father   . Breast cancer Paternal Aunt   . Stomach cancer Paternal Aunt   . Stroke Mother   . Dementia Mother     Allergies  Allergen Reactions  . Penicillins     Current Outpatient Prescriptions on File Prior to Visit  Medication Sig Dispense Refill  . albuterol (PROAIR HFA) 108 (90  BASE) MCG/ACT inhaler Inhale 2 puffs into the lungs 2 (two) times daily.  1 Inhaler  3  . ciprofloxacin (CIPRO) 500 MG tablet Take 1 tablet (500 mg total) by mouth 2 (two) times daily.  20 tablet  0  . clonazePAM (KLONOPIN) 0.5 MG tablet Take 1.5 tablets (0.75 mg total) by mouth at bedtime as needed.  45 tablet  5  . cloNIDine (CATAPRES) 0.1 MG tablet 1/2 tablet twice daily  30 tablet  5  . dicyclomine (BENTYL) 10 MG capsule Take 1 capsule (10 mg total) by mouth 3 (three) times daily.  90 capsule  3  . gabapentin (NEURONTIN) 100 MG capsule Take 1 to 2 capsules in the evening as directed  60 capsule  2  . hydrochlorothiazide (HYDRODIURIL) 25 MG tablet TAKE 1 TABLET BY MOUTH EVERY DAY  30 tablet  1  . metroNIDAZOLE (FLAGYL) 500 MG tablet Take 1 tablet (500 mg total) by mouth 3 (three) times daily.  30 tablet  0  . ondansetron (ZOFRAN) 4 MG tablet Take 1 tablet (4 mg total) by mouth every 6 (six) hours.  20 tablet  3  . pantoprazole (PROTONIX) 40 MG tablet Take 40 mg by mouth daily.      Marland Kitchen  SYNTHROID 125 MCG tablet Take 1 tablet (125 mcg total) by mouth daily.  30 tablet  3    BP 122/74  Pulse 56  Temp 98.2 F (36.8 C) (Oral)  Wt 117 lb (53.071 kg)       Objective:   Physical Exam  Constitutional: She appears well-developed and well-nourished.  Cardiovascular: Normal rate, regular rhythm and normal heart sounds.        Diminished left radial pulse  Pulmonary/Chest: Effort normal and breath sounds normal. She has no wheezes.          Assessment & Plan:

## 2012-08-24 NOTE — Assessment & Plan Note (Signed)
Continue pain medication for now.  Patient urged to start gabapentin.

## 2012-08-25 MED ORDER — PANTOPRAZOLE SODIUM 40 MG PO TBEC: 40.0000 mg | DELAYED_RELEASE_TABLET | Freq: Every day | ORAL | Status: DC

## 2012-08-25 MED ORDER — CLONIDINE HCL 0.1 MG PO TABS: ORAL_TABLET | ORAL | Status: DC

## 2012-08-25 NOTE — Addendum Note (Signed)
Addended by: Alfred Levins D on: 08/25/2012 11:07 AM   Modules accepted: Orders

## 2012-08-28 ENCOUNTER — Telehealth: Payer: Self-pay | Admitting: *Deleted

## 2012-08-28 MED ORDER — LEVOTHYROXINE SODIUM 125 MCG PO TABS: 125.0000 ug | ORAL_TABLET | Freq: Every day | ORAL | Status: DC

## 2012-08-28 NOTE — Telephone Encounter (Signed)
Pt needs a refill on her Synthroid but she is wanting to go back on generic.  Is this ok?

## 2012-08-28 NOTE — Telephone Encounter (Signed)
Pt aware, rx sent in electronically 

## 2012-08-28 NOTE — Telephone Encounter (Signed)
Yes. Ok to change to levothyroxine.  Give 3 month supply x 1.  Pt should have repeat TSH within 6 weeks of starting generic.  244.9

## 2012-09-05 ENCOUNTER — Other Ambulatory Visit (HOSPITAL_COMMUNITY): Payer: 59

## 2012-09-05 ENCOUNTER — Ambulatory Visit (HOSPITAL_COMMUNITY)
Admission: RE | Admit: 2012-09-05 | Discharge: 2012-09-05 | Disposition: A | Discharge status: Home / Self Care | Payer: Medicare PPO | Source: Ambulatory Visit | Attending: Cardiovascular Disease | Admitting: Cardiovascular Disease

## 2012-09-05 ENCOUNTER — Encounter (HOSPITAL_COMMUNITY): Payer: Self-pay

## 2012-09-05 DIAGNOSIS — R42 Dizziness and giddiness: Secondary | ICD-10-CM

## 2012-09-05 DIAGNOSIS — R0989 Other specified symptoms and signs involving the circulatory and respiratory systems: Secondary | ICD-10-CM

## 2012-09-05 DIAGNOSIS — I742 Embolism and thrombosis of arteries of the upper extremities: Secondary | ICD-10-CM | POA: Insufficient documentation

## 2012-09-05 DIAGNOSIS — I6509 Occlusion and stenosis of unspecified vertebral artery: Secondary | ICD-10-CM | POA: Insufficient documentation

## 2012-09-05 DIAGNOSIS — R209 Unspecified disturbances of skin sensation: Secondary | ICD-10-CM | POA: Insufficient documentation

## 2012-09-05 MED ORDER — IOHEXOL 350 MG/ML SOLN
50.0000 mL | Freq: Once | INTRAVENOUS | Status: AC | PRN
  Administered 2012-09-05: 50 mL via INTRAVENOUS

## 2012-09-05 MED ORDER — IOHEXOL 350 MG/ML SOLN
75.0000 mL | Freq: Once | INTRAVENOUS | Status: AC | PRN
  Administered 2012-09-05: 75 mL via INTRAVENOUS

## 2012-09-06 NOTE — Progress Notes (Signed)
Pt was notified of results and has a f/u appt on 09/21/12

## 2012-09-09 ENCOUNTER — Other Ambulatory Visit: Payer: Self-pay | Admitting: Internal Medicine

## 2012-09-20 ENCOUNTER — Encounter: Payer: Self-pay | Admitting: *Deleted

## 2012-09-20 ENCOUNTER — Ambulatory Visit (INDEPENDENT_AMBULATORY_CARE_PROVIDER_SITE_OTHER): Payer: 59 | Admitting: Cardiovascular Disease

## 2012-09-20 ENCOUNTER — Encounter: Payer: Self-pay | Admitting: Cardiovascular Disease

## 2012-09-20 VITALS — BP 108/64 | HR 60 | Ht 62.5 in | Wt 117.0 lb

## 2012-09-20 DIAGNOSIS — I771 Stricture of artery: Secondary | ICD-10-CM

## 2012-09-20 MED ORDER — CLOPIDOGREL BISULFATE 75 MG PO TABS: 75.0000 mg | ORAL_TABLET | Freq: Every day | ORAL | Status: DC

## 2012-09-20 NOTE — Progress Notes (Signed)
HPI:  66 year-old woman presenting for followup evaluation. The patient was seen last month for left arm pain and absent pulses. 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 She presents today for followup discussion.  The patient notes progressive pain in her left arm. The pain is in the left shoulder radiating down to the forearm. Her pain is worse with movement of the arm and she describes it as an ache. She has some dizziness but has not had syncope. She denies chest pain or pressure. She has mild symptoms on the right side but the left side is much worse. She denies leg claudication symptoms, but does have chronic leg pain. She has fibromyalgia and has multiple areas of pain.  Outpatient Encounter Prescriptions as of 09/20/2012  Medication Sig Dispense Refill  . albuterol (PROAIR HFA) 108 (90 BASE) MCG/ACT inhaler Inhale 2 puffs into the lungs 2 (two) times daily.  1 Inhaler  3  . ciprofloxacin (CIPRO) 500 MG tablet Take 1 tablet (500 mg total) by mouth 2 (two) times daily.  20 tablet  0  . clonazePAM (KLONOPIN) 0.5 MG tablet Take 1.5 tablets (0.75 mg total) by mouth at bedtime as needed.  45 tablet  5  . cloNIDine (CATAPRES) 0.1 MG tablet 1/2 tablet twice daily  30 tablet  5  . dicyclomine (BENTYL) 10 MG capsule Take 1 capsule (10 mg total) by mouth 3 (three) times daily.  90 capsule  3  . gabapentin (NEURONTIN) 100 MG capsule Take 1 to 2 capsules in the evening as directed  60 capsule  2  . hydrochlorothiazide (HYDRODIURIL) 25 MG tablet TAKE 1 TABLET BY MOUTH EVERY DAY  30 tablet  1  . levothyroxine (SYNTHROID) 125 MCG tablet Take 1 tablet (125 mcg total) by mouth daily.  90 tablet  1  . metroNIDAZOLE (FLAGYL) 500 MG tablet Take 1 tablet (500 mg total) by mouth 3 (three) times daily.  30 tablet  0  . morphine (MS CONTIN) 30 MG 12 hr tablet Take 1 tablet (30 mg total) by mouth 2 (two) times  daily.  60 tablet  0  . ondansetron (ZOFRAN) 4 MG tablet Take 1 tablet (4 mg total) by mouth every 6 (six) hours.  20 tablet  3  . oxyCODONE-acetaminophen (PERCOCET) 10-325 MG per tablet Take 1 tablet by mouth every 12 (twelve) hours as needed.  60 tablet  0  . pantoprazole (PROTONIX) 40 MG tablet Take 1 tablet (40 mg total) by mouth daily.  30 tablet  5  . clopidogrel (PLAVIX) 75 MG tablet Take 1 tablet (75 mg total) by mouth daily.  30 tablet  12    Allergies  Allergen Reactions  . Penicillins     Past Medical History  Diagnosis Date  . Anxiety   . Asthma   . Depression   . Hypertension   . Hypothyroidism   . COPD (chronic obstructive pulmonary disease)   . Fibromyalgia   . Hyperlipidemia   . Arrhythmia      AVNRT  . Anemia     ROS: Negative except as per HPI  BP 108/64  Pulse 60  Ht 5' 2.5" (1.588 m)  Wt 53.071 kg (117 lb)  BMI 21.06 kg/m2  PHYSICAL EXAM: Pt is alert and oriented, NAD HEENT: normal Neck: JVP - normal, carotids 2+= without bruits Lungs: CTA bilaterally CV: RRR with left chest bruit Abd: soft, NT, Positive  BS, no hepatomegaly Ext: no C/C/E, left arm pulses nonpalpable Skin: warm/dry no rash  CTA: IMPRESSION:  1. Significant occlusive disease of the proximal left subclavian  artery proximal to the vertebral artery origin and beyond the  origin of the vessel. Segmental lesion demonstrates critical  stenosis and near subtotal occlusion and consists primarily of  noncalcified plaque.  2. No other significant occlusive disease is identified in the  left upper extremity. The left radial artery shows high origin and  is very small in caliber with dominant inflow into the hand  consisting of a widely patent ulnar artery.  3. Incidental evidence on the study of calcified coronary artery  plaque and left external iliac artery disease.  ASSESSMENT AND PLAN: Severe left subclavian artery stenosis. I have personally reviewed the CT angiogram images.  Considering the patient's progressive left arm pain that I suspect is related to vascular insufficiency, I think it is appropriate to proceed with angiography and consideration of PTA and stenting of the left subclavian artery. I discussed the risks, alternatives, and potential benefit to angiography and stenting. The risks include vascular injury, bleeding, stroke, myocardial infarction, and death. She understands the risks are very low. The patient and her family are agreeable to proceed. She will be started on Plavix prior to the scheduled angiogram.  [name] 09/20/2012 6:38 PM

## 2012-09-20 NOTE — Patient Instructions (Addendum)
Your physician recommends that you schedule a follow-up appointment in: 6 WEEKS WITH DR Excell Seltzer  Your physician has requested that you have a peripheral vascular angiogram. This exam is performed at the hospital. During this exam IV contrast is used to look at arterial blood flow. Please review the information sheet given for details.

## 2012-09-21 ENCOUNTER — Ambulatory Visit: Payer: 59 | Admitting: Cardiovascular Disease

## 2012-09-29 HISTORY — PX: CORONARY STENT PLACEMENT: SHX1402

## 2012-10-02 ENCOUNTER — Other Ambulatory Visit: Payer: Self-pay | Admitting: *Deleted

## 2012-10-02 MED ORDER — ONDANSETRON HCL 4 MG PO TABS: 4.0000 mg | ORAL_TABLET | Freq: Four times a day (QID) | ORAL | Status: DC

## 2012-10-04 ENCOUNTER — Other Ambulatory Visit: Payer: Self-pay | Admitting: Cardiovascular Disease

## 2012-10-04 DIAGNOSIS — I70219 Atherosclerosis of native arteries of extremities with intermittent claudication, unspecified extremity: Secondary | ICD-10-CM

## 2012-10-11 ENCOUNTER — Ambulatory Visit (INDEPENDENT_AMBULATORY_CARE_PROVIDER_SITE_OTHER): Payer: 59 | Admitting: Internal Medicine

## 2012-10-11 ENCOUNTER — Other Ambulatory Visit: Payer: 59

## 2012-10-11 ENCOUNTER — Encounter: Payer: Self-pay | Admitting: Internal Medicine

## 2012-10-11 VITALS — BP 170/84 | Temp 98.4°F | Wt 118.0 lb

## 2012-10-11 DIAGNOSIS — I10 Essential (primary) hypertension: Secondary | ICD-10-CM

## 2012-10-11 DIAGNOSIS — F341 Dysthymic disorder: Secondary | ICD-10-CM

## 2012-10-11 DIAGNOSIS — E039 Hypothyroidism, unspecified: Secondary | ICD-10-CM

## 2012-10-11 MED ORDER — LOSARTAN POTASSIUM 25 MG PO TABS: 25.0000 mg | ORAL_TABLET | Freq: Every day | ORAL | Status: DC

## 2012-10-11 MED ORDER — CLONAZEPAM 1 MG PO TABS: 1.0000 mg | ORAL_TABLET | Freq: Every evening | ORAL | Status: DC | PRN

## 2012-10-11 MED ORDER — ALPRAZOLAM 0.25 MG PO TABS: 0.2500 mg | ORAL_TABLET | Freq: Two times a day (BID) | ORAL | Status: DC | PRN

## 2012-10-11 NOTE — Progress Notes (Signed)
Subjective:    Patient ID: Joann Ray, female    DOB: May 20, 1946, 66 y.o.   MRN: [id number]  HPI  66 year old white female with history of chronic fibromyalgia, anxiety /depression, hypertension and newly diagnosed significant left subclavian stenosis for followup. She was seen by Dr. Excell Seltzer and CT angiogram showed severe stenosis of left subclavian artery.  Possible stenting scheduled for 11/22.  Patient feeling apprehensive about procedure.  She reports feeling "jittery" inside.  Her blood pressures been higher than normal. She's been compliant with clonidine. She is also taking hydrochlorothiazide.  Review of Systems She has intermittent left arm pain.  She feels likes her left collar bone area is "swollen"  Past Medical History  Diagnosis Date  . Anxiety   . Asthma   . Depression   . Hypertension   . Hypothyroidism   . COPD (chronic obstructive pulmonary disease)   . Fibromyalgia   . Hyperlipidemia   . Arrhythmia      AVNRT  . Anemia     History   Social History  . Marital Status: Married    Spouse Name: N/A    Number of Children: 3  . Years of Education: N/A   Occupational History  . DISABLED    Social History Main Topics  . Smoking status: Former Smoker    Types: Cigarettes    Quit date: 11/29/2009  . Smokeless tobacco: Never Used     Comment: September 2011, again Oct 2013  . Alcohol Use: No  . Drug Use: No  . Sexually Active: Not on file   Other Topics Concern  . Not on file   Social History Narrative  . No narrative on file    Past Surgical History  Procedure Date  . Appendectomy   . Abdominal hysterectomy   . Nasal sinus surgery 1993  . Cardiac electrophysiology mapping and ablation 08/28/2010    EPS/RFA of AVNRT  - Dr. Sharrell Ku    Family History  Problem Relation Age of Onset  . Colon cancer Sister 69  . Diabetes Mother   . Skin cancer Father   . Breast cancer Paternal Aunt   . Stomach cancer Paternal Aunt   . Stroke  Mother   . Dementia Mother     Allergies  Allergen Reactions  . Penicillins Anaphylaxis and Hives    Current Outpatient Prescriptions on File Prior to Visit  Medication Sig Dispense Refill  . albuterol (PROAIR HFA) 108 (90 BASE) MCG/ACT inhaler Inhale 2 puffs into the lungs 2 (two) times daily.  1 Inhaler  3  . albuterol (PROVENTIL HFA;VENTOLIN HFA) 108 (90 BASE) MCG/ACT inhaler Inhale 2 puffs into the lungs 2 (two) times daily as needed. For shortness of breath      . cloNIDine (CATAPRES) 0.1 MG tablet Take 0.05 mg by mouth 2 (two) times daily.      . clopidogrel (PLAVIX) 75 MG tablet Take 1 tablet (75 mg total) by mouth daily.  30 tablet  12  . dicyclomine (BENTYL) 10 MG capsule Take 10 mg by mouth 3 (three) times daily as needed. For stomach cramps      . gabapentin (NEURONTIN) 100 MG capsule Take 100 mg by mouth every evening.      . hydrochlorothiazide (HYDRODIURIL) 25 MG tablet Take 25 mg by mouth daily.      Marland Kitchen levothyroxine (SYNTHROID) 125 MCG tablet Take 1 tablet (125 mcg total) by mouth daily.  90 tablet  1  . morphine (MS CONTIN) 30 MG  12 hr tablet Take 1 tablet (30 mg total) by mouth 2 (two) times daily.  60 tablet  0  . ondansetron (ZOFRAN) 4 MG tablet Take 4 mg by mouth every 6 (six) hours as needed. For nausea/vomiting      . oxyCODONE-acetaminophen (PERCOCET) 10-325 MG per tablet Take 1 tablet by mouth every 12 (twelve) hours as needed. For pain      . pantoprazole (PROTONIX) 40 MG tablet Take 1 tablet (40 mg total) by mouth daily.  30 tablet  5  . losartan (COZAAR) 25 MG tablet Take 1 tablet (25 mg total) by mouth daily.  30 tablet  2  . [DISCONTINUED] hydrochlorothiazide (HYDRODIURIL) 25 MG tablet Take 25 mg by mouth daily as needed. For excess fluid        BP 170/84  Temp 98.4 F (36.9 C) (Oral)  Wt 118 lb (53.524 kg)       Objective:   Physical Exam  Constitutional: She is oriented to person, place, and time. She appears well-developed and well-nourished.    Cardiovascular: Normal rate, regular rhythm and normal heart sounds.   No murmur heard. Pulmonary/Chest: Effort normal and breath sounds normal. She has no wheezes.  Musculoskeletal: She exhibits no edema.  Neurological: She is alert and oriented to person, place, and time.  Psychiatric:       Anxious, tearful          Assessment & Plan:

## 2012-10-11 NOTE — Patient Instructions (Addendum)
Keep your next follow up appointment Please complete the following lab tests within 2 weeks: BMET - 401.9 TSH, Free T4 - 244.9

## 2012-10-11 NOTE — Assessment & Plan Note (Signed)
BP elevation likely aggravated by anxiety.  Add low dose losartan.  BP: 170/84 mmHg

## 2012-10-11 NOTE — Assessment & Plan Note (Signed)
Patient anxious about upcoming procedure for stenting of left subclavian artery. She's not sleeping well. Increase clonazepam to 1 mg at bedtime. Use alprazolam 0.25 mg twice a day as needed.

## 2012-10-11 NOTE — Assessment & Plan Note (Signed)
Monitor TFTs

## 2012-10-12 ENCOUNTER — Other Ambulatory Visit (INDEPENDENT_AMBULATORY_CARE_PROVIDER_SITE_OTHER): Payer: 59

## 2012-10-12 DIAGNOSIS — I771 Stricture of artery: Secondary | ICD-10-CM

## 2012-10-12 LAB — CBC WITH DIFFERENTIAL/PLATELET
Basophils Absolute: 0 10*3/uL (ref 0.0–0.1)
Basophils Relative: 0.6 % (ref 0.0–3.0)
Eosinophils Absolute: 0.1 10*3/uL (ref 0.0–0.7)
Lymphocytes Relative: 23.1 % (ref 12.0–46.0)
MCHC: 33.2 g/dL (ref 30.0–36.0)
MCV: 92.7 fl (ref 78.0–100.0)
Monocytes Absolute: 0.7 10*3/uL (ref 0.1–1.0)
Neutro Abs: 5.4 10*3/uL (ref 1.4–7.7)
Neutrophils Relative %: 66.6 % (ref 43.0–77.0)
RBC: 4.05 Mil/uL (ref 3.87–5.11)
RDW: 14.8 % — ABNORMAL HIGH (ref 11.5–14.6)

## 2012-10-12 LAB — BASIC METABOLIC PANEL
CO2: 29 mEq/L (ref 19–32)
Calcium: 8.9 mg/dL (ref 8.4–10.5)
Chloride: 92 mEq/L — ABNORMAL LOW (ref 96–112)
Creatinine, Ser: 0.8 mg/dL (ref 0.4–1.2)
Glucose, Bld: 93 mg/dL (ref 70–99)

## 2012-10-18 ENCOUNTER — Ambulatory Visit (HOSPITAL_COMMUNITY)
Admission: RE | Admit: 2012-10-18 | Discharge: 2012-10-18 | Disposition: A | Discharge status: Home / Self Care | Payer: Medicare PPO | Source: Ambulatory Visit | Attending: Cardiovascular Disease | Admitting: Cardiovascular Disease

## 2012-10-18 ENCOUNTER — Encounter (HOSPITAL_COMMUNITY)
Admission: RE | Disposition: A | Discharge status: Home / Self Care | Payer: Self-pay | Source: Ambulatory Visit | Attending: Cardiovascular Disease

## 2012-10-18 DIAGNOSIS — I70219 Atherosclerosis of native arteries of extremities with intermittent claudication, unspecified extremity: Secondary | ICD-10-CM

## 2012-10-18 DIAGNOSIS — I708 Atherosclerosis of other arteries: Secondary | ICD-10-CM

## 2012-10-18 DIAGNOSIS — I771 Stricture of artery: Secondary | ICD-10-CM | POA: Insufficient documentation

## 2012-10-18 DIAGNOSIS — J4489 Other specified chronic obstructive pulmonary disease: Secondary | ICD-10-CM | POA: Insufficient documentation

## 2012-10-18 DIAGNOSIS — I10 Essential (primary) hypertension: Secondary | ICD-10-CM | POA: Insufficient documentation

## 2012-10-18 DIAGNOSIS — I739 Peripheral vascular disease, unspecified: Secondary | ICD-10-CM | POA: Insufficient documentation

## 2012-10-18 DIAGNOSIS — J449 Chronic obstructive pulmonary disease, unspecified: Secondary | ICD-10-CM | POA: Insufficient documentation

## 2012-10-18 HISTORY — PX: UNILATERAL UPPER EXTREMEITY ANGIOGRAM: SHX5517

## 2012-10-18 HISTORY — PX: PERCUTANEOUS STENT INTERVENTION: SHX5500

## 2012-10-18 LAB — POCT ACTIVATED CLOTTING TIME: Activated Clotting Time: 219 seconds

## 2012-10-18 SURGERY — UNILATERAL UPPER EXTREMEITY ANGIOGRAM
Anesthesia: LOCAL | Laterality: Left

## 2012-10-18 MED ORDER — ACETAMINOPHEN 325 MG PO TABS: 650.0000 mg | ORAL_TABLET | ORAL | Status: DC | PRN

## 2012-10-18 MED ORDER — SODIUM CHLORIDE 0.9 % IJ SOLN: 3.0000 mL | INTRAMUSCULAR | Status: DC | PRN

## 2012-10-18 MED ORDER — MIDAZOLAM HCL 2 MG/2ML IJ SOLN
INTRAMUSCULAR | Status: AC
  Filled 2012-10-18: qty 2

## 2012-10-18 MED ORDER — SODIUM CHLORIDE 0.9 % IJ SOLN: 3.0000 mL | Freq: Two times a day (BID) | INTRAMUSCULAR | Status: DC

## 2012-10-18 MED ORDER — ONDANSETRON HCL 4 MG/2ML IJ SOLN: 4.0000 mg | Freq: Four times a day (QID) | INTRAMUSCULAR | Status: DC | PRN

## 2012-10-18 MED ORDER — FENTANYL CITRATE 0.05 MG/ML IJ SOLN
INTRAMUSCULAR | Status: AC
  Filled 2012-10-18: qty 2

## 2012-10-18 MED ORDER — CLOPIDOGREL BISULFATE 75 MG PO TABS
75.0000 mg | ORAL_TABLET | ORAL | Status: AC
  Administered 2012-10-18: 75 mg via ORAL
  Filled 2012-10-18: qty 1

## 2012-10-18 MED ORDER — ASPIRIN 81 MG PO CHEW
324.0000 mg | CHEWABLE_TABLET | ORAL | Status: AC
  Administered 2012-10-18: 324 mg via ORAL
  Filled 2012-10-18: qty 4

## 2012-10-18 MED ORDER — DIAZEPAM 5 MG PO TABS
5.0000 mg | ORAL_TABLET | ORAL | Status: AC
  Administered 2012-10-18: 5 mg via ORAL
  Filled 2012-10-18: qty 1

## 2012-10-18 MED ORDER — SODIUM CHLORIDE 0.9 % IV SOLN: INTRAVENOUS | Status: DC

## 2012-10-18 MED ORDER — HEPARIN (PORCINE) IN NACL 2-0.9 UNIT/ML-% IJ SOLN
INTRAMUSCULAR | Status: AC
  Filled 2012-10-18: qty 1000

## 2012-10-18 MED ORDER — SODIUM CHLORIDE 0.9 % IV SOLN: 250.0000 mL | INTRAVENOUS | Status: DC | PRN

## 2012-10-18 MED ORDER — OXYCODONE-ACETAMINOPHEN 5-325 MG PO TABS: 1.0000 | ORAL_TABLET | ORAL | Status: DC | PRN

## 2012-10-18 MED ORDER — HEPARIN SODIUM (PORCINE) 1000 UNIT/ML IJ SOLN
INTRAMUSCULAR | Status: AC
  Filled 2012-10-18: qty 1

## 2012-10-18 MED ORDER — LIDOCAINE HCL (PF) 1 % IJ SOLN
INTRAMUSCULAR | Status: AC
  Filled 2012-10-18: qty 30

## 2012-10-18 MED ORDER — SODIUM CHLORIDE 0.9 % IV SOLN: 1.0000 mL/kg/h | INTRAVENOUS | Status: DC

## 2012-10-18 NOTE — CV Procedure (Signed)
   Cardiac Catheterization Procedure Note  Name: Joann Ray MRN: [id number] DOB: 23-Jul-1946  Procedure: left subclavian angiography, left subclavian PTA and stent, PerClose RFA  Indication: Left arm claudication, severe subclavian stenosis  Procedural details: The right groin was prepped, draped, and anesthetized with 1% lidocaine. Using modified Seldinger technique, a 5 French sheath was introduced into the right femoral artery. A ber-2 catheter was used to engage the left subclavian artery. This confirmed severe 95% stenosis of the proximal subclavian artery before the origin of the left vertebral. The left vertebral exhibited to and fro flow. This subclavian beyond the vertebral was free of significant disease.  At that point, IV heparin was administered. A 6 Fr Shuttle sheath was advanced into the descending aorta and the left subclavian was re-engaged. Once a therapeutic ACT was achieved, a glidewire was advanced across the lesion. A 4 mm balloon was advanced and the wire was changed to a Wholey wire. The lesion was then predilated successfully. The lesion was then stented with a 7x18 Cordis balloon expandable stent deployed at 8 ATM. Angiography confirmed an excellent result with 0% residual stenosis. The patient tolerated the procedure well. The femoral arteriotomy was closed with a PerClose device. The patient was transferred to the recovery area in stable condition.  Procedural Findings:  Final Conclusions:  Severe left subclavian stenosis treated successfully with PTA and stenting.  Recommendations: Continue ASA 81 mg long-term. Plavix x minimum of 30 days.  [name] 10/18/2012, 10:27 AM

## 2012-10-18 NOTE — Interval H&P Note (Signed)
History and Physical Interval Note:  10/18/2012 8:52 AM  Joann Ray  has presented today for surgery, with the diagnosis of PVD  The various methods of treatment have been discussed with the patient and family. After consideration of risks, benefits and other options for treatment, the patient has consented to  Procedure(s) (LRB) with comments: UNILATERAL UPPER EXTREMEITY ANGIOGRAM (Bilateral) as a surgical intervention .  The patient's history has been reviewed, patient examined, no change in status, stable for surgery.  I have reviewed the patient's chart and labs.  Questions were answered to the patient's satisfaction.     [name]

## 2012-10-18 NOTE — Progress Notes (Signed)
Discharged home; right groin stable without bleed/hematoma; small bruising noted; denies pain or discomfort; follow up 11/03/2012

## 2012-10-18 NOTE — H&P (View-Only) (Signed)
 HPI:  66 year-old woman presenting for followup evaluation. The patient was seen last month for left arm pain and absent pulses. 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 She presents today for followup discussion.  The patient notes progressive pain in her left arm. The pain is in the left shoulder radiating down to the forearm. Her pain is worse with movement of the arm and she describes it as an ache. She has some dizziness but has not had syncope. She denies chest pain or pressure. She has mild symptoms on the right side but the left side is much worse. She denies leg claudication symptoms, but does have chronic leg pain. She has fibromyalgia and has multiple areas of pain.  Outpatient Encounter Prescriptions as of 09/20/2012  Medication Sig Dispense Refill  . albuterol (PROAIR HFA) 108 (90 BASE) MCG/ACT inhaler Inhale 2 puffs into the lungs 2 (two) times daily.  1 Inhaler  3  . ciprofloxacin (CIPRO) 500 MG tablet Take 1 tablet (500 mg total) by mouth 2 (two) times daily.  20 tablet  0  . clonazePAM (KLONOPIN) 0.5 MG tablet Take 1.5 tablets (0.75 mg total) by mouth at bedtime as needed.  45 tablet  5  . cloNIDine (CATAPRES) 0.1 MG tablet 1/2 tablet twice daily  30 tablet  5  . dicyclomine (BENTYL) 10 MG capsule Take 1 capsule (10 mg total) by mouth 3 (three) times daily.  90 capsule  3  . gabapentin (NEURONTIN) 100 MG capsule Take 1 to 2 capsules in the evening as directed  60 capsule  2  . hydrochlorothiazide (HYDRODIURIL) 25 MG tablet TAKE 1 TABLET BY MOUTH EVERY DAY  30 tablet  1  . levothyroxine (SYNTHROID) 125 MCG tablet Take 1 tablet (125 mcg total) by mouth daily.  90 tablet  1  . metroNIDAZOLE (FLAGYL) 500 MG tablet Take 1 tablet (500 mg total) by mouth 3 (three) times daily.  30 tablet  0  . morphine (MS CONTIN) 30 MG 12 hr tablet Take 1 tablet (30 mg total) by mouth 2 (two) times  daily.  60 tablet  0  . ondansetron (ZOFRAN) 4 MG tablet Take 1 tablet (4 mg total) by mouth every 6 (six) hours.  20 tablet  3  . oxyCODONE-acetaminophen (PERCOCET) 10-325 MG per tablet Take 1 tablet by mouth every 12 (twelve) hours as needed.  60 tablet  0  . pantoprazole (PROTONIX) 40 MG tablet Take 1 tablet (40 mg total) by mouth daily.  30 tablet  5  . clopidogrel (PLAVIX) 75 MG tablet Take 1 tablet (75 mg total) by mouth daily.  30 tablet  12    Allergies  Allergen Reactions  . Penicillins     Past Medical History  Diagnosis Date  . Anxiety   . Asthma   . Depression   . Hypertension   . Hypothyroidism   . COPD (chronic obstructive pulmonary disease)   . Fibromyalgia   . Hyperlipidemia   . Arrhythmia      AVNRT  . Anemia     ROS: Negative except as per HPI  BP 108/64  Pulse 60  Ht 5' 2.5" (1.588 m)  Wt 53.071 kg (117 lb)  BMI 21.06 kg/m2  PHYSICAL EXAM: Pt is alert and oriented, NAD HEENT: normal Neck: JVP - normal, carotids 2+= without bruits Lungs: CTA bilaterally CV: RRR with left chest bruit Abd: soft, NT, Positive   BS, no hepatomegaly Ext: no C/C/E, left arm pulses nonpalpable Skin: warm/dry no rash  CTA: IMPRESSION:  1. Significant occlusive disease of the proximal left subclavian  artery proximal to the vertebral artery origin and beyond the  origin of the vessel. Segmental lesion demonstrates critical  stenosis and near subtotal occlusion and consists primarily of  noncalcified plaque.  2. No other significant occlusive disease is identified in the  left upper extremity. The left radial artery shows high origin and  is very small in caliber with dominant inflow into the hand  consisting of a widely patent ulnar artery.  3. Incidental evidence on the study of calcified coronary artery  plaque and left external iliac artery disease.  ASSESSMENT AND PLAN: Severe left subclavian artery stenosis. I have personally reviewed the CT angiogram images.  Considering the patient's progressive left arm pain that I suspect is related to vascular insufficiency, I think it is appropriate to proceed with angiography and consideration of PTA and stenting of the left subclavian artery. I discussed the risks, alternatives, and potential benefit to angiography and stenting. The risks include vascular injury, bleeding, stroke, myocardial infarction, and death. She understands the risks are very low. The patient and her family are agreeable to proceed. She will be started on Plavix prior to the scheduled angiogram.  Anderson Coppock 09/20/2012 6:38 PM      

## 2012-10-24 ENCOUNTER — Ambulatory Visit (INDEPENDENT_AMBULATORY_CARE_PROVIDER_SITE_OTHER): Payer: 59 | Admitting: Internal Medicine

## 2012-10-24 ENCOUNTER — Encounter: Payer: Self-pay | Admitting: Internal Medicine

## 2012-10-24 VITALS — BP 128/74 | HR 68 | Temp 98.0°F | Wt 116.0 lb

## 2012-10-24 DIAGNOSIS — I739 Peripheral vascular disease, unspecified: Secondary | ICD-10-CM

## 2012-10-24 DIAGNOSIS — IMO0001 Reserved for inherently not codable concepts without codable children: Secondary | ICD-10-CM

## 2012-10-24 DIAGNOSIS — I10 Essential (primary) hypertension: Secondary | ICD-10-CM

## 2012-10-24 LAB — BASIC METABOLIC PANEL
CO2: 26 mEq/L (ref 19–32)
Calcium: 9 mg/dL (ref 8.4–10.5)
Creatinine, Ser: 0.8 mg/dL (ref 0.4–1.2)
GFR: 80.16 mL/min (ref >60.00)
Sodium: 137 mEq/L (ref 135–145)

## 2012-10-24 MED ORDER — GABAPENTIN 100 MG PO CAPS: 200.0000 mg | ORAL_CAPSULE | Freq: Every evening | ORAL | Status: DC

## 2012-10-24 MED ORDER — CLONIDINE HCL 0.1 MG PO TABS: 0.0500 mg | ORAL_TABLET | Freq: Two times a day (BID) | ORAL | Status: DC

## 2012-10-24 MED ORDER — OXYCODONE-ACETAMINOPHEN 10-325 MG PO TABS: 1.0000 | ORAL_TABLET | Freq: Two times a day (BID) | ORAL | Status: DC | PRN

## 2012-10-24 MED ORDER — MORPHINE SULFATE ER 30 MG PO TBCR: 30.0000 mg | EXTENDED_RELEASE_TABLET | Freq: Every day | ORAL | Status: DC

## 2012-10-24 MED ORDER — LOSARTAN POTASSIUM 25 MG PO TABS: 25.0000 mg | ORAL_TABLET | Freq: Every day | ORAL | Status: DC

## 2012-10-24 NOTE — Assessment & Plan Note (Signed)
Patient abruptly stopped morphine sulfate on her own. She was concerned about stigma of taking chronic narcotics. I stressed importance of slowly tapering indications over several months. Decrease morphine sulfate SR 30 mg once daily. Continue to use Percocet for breakthrough pain for now. Increase gabapentin to 200 mg at bedtime. We will further decrease her current dose next office visit.

## 2012-10-24 NOTE — Progress Notes (Signed)
Subjective:    Patient ID: Joann Ray, female    DOB: 07/29/1946, 66 y.o.   MRN: [id number]  HPI  66 year old white female with peripheral vascular disease, chronic fibromyalgia, and anxiety / depression for follow up. Since previous visit she was seen by vascular specialist-Dr. Excell Seltzer. She successfully underwent PCI of left subclavian artery. She did not have any complications from her procedure. Patient reports left-sided fullness sensation has improved.  After hospital discharge patient felt uncomfortable with taking morphine due to nursing comments. She abruptly  discontinued morphine on her own. Patient has been taking Percocet 10/325 mg every 5-6 hours.  Htn - significantly improved.  She is tolerating losartan. Review of Systems Negative for diarrhea, no diaporesis  Past Medical History  Diagnosis Date  . Anxiety   . Asthma   . Depression   . Hypertension   . Hypothyroidism   . COPD (chronic obstructive pulmonary disease)   . Fibromyalgia   . Hyperlipidemia   . Arrhythmia      AVNRT  . Anemia     History   Social History  . Marital Status: Married    Spouse Name: N/A    Number of Children: 3  . Years of Education: N/A   Occupational History  . DISABLED    Social History Main Topics  . Smoking status: Former Smoker    Types: Cigarettes    Quit date: 11/29/2009  . Smokeless tobacco: Never Used     Comment: September 2011, again Oct 2013  . Alcohol Use: No  . Drug Use: No  . Sexually Active: Not on file   Other Topics Concern  . Not on file   Social History Narrative  . No narrative on file    Past Surgical History  Procedure Date  . Appendectomy   . Abdominal hysterectomy   . Nasal sinus surgery 1993  . Cardiac electrophysiology mapping and ablation 08/28/2010    EPS/RFA of AVNRT  - Dr. Sharrell Ku    Family History  Problem Relation Age of Onset  . Colon cancer Sister 7  . Diabetes Mother   . Skin cancer Father   . Breast cancer  Paternal Aunt   . Stomach cancer Paternal Aunt   . Stroke Mother   . Dementia Mother     Allergies  Allergen Reactions  . Penicillins Anaphylaxis and Hives    Current Outpatient Prescriptions on File Prior to Visit  Medication Sig Dispense Refill  . albuterol (PROAIR HFA) 108 (90 BASE) MCG/ACT inhaler Inhale 2 puffs into the lungs 2 (two) times daily.  1 Inhaler  3  . ALPRAZolam (XANAX) 0.25 MG tablet Take 1 tablet (0.25 mg total) by mouth 2 (two) times daily as needed for sleep or anxiety.  30 tablet  2  . clonazePAM (KLONOPIN) 1 MG tablet Take 1 tablet (1 mg total) by mouth at bedtime as needed. To help rest  30 tablet  3  . clopidogrel (PLAVIX) 75 MG tablet Take 1 tablet (75 mg total) by mouth daily.  30 tablet  12  . dicyclomine (BENTYL) 10 MG capsule Take 10 mg by mouth 3 (three) times daily as needed. For stomach cramps      . hydrochlorothiazide (HYDRODIURIL) 25 MG tablet Take 25 mg by mouth daily.      Marland Kitchen levothyroxine (SYNTHROID) 125 MCG tablet Take 1 tablet (125 mcg total) by mouth daily.  90 tablet  1  . ondansetron (ZOFRAN) 4 MG tablet Take 4 mg by mouth  every 6 (six) hours as needed. For nausea/vomiting      . pantoprazole (PROTONIX) 40 MG tablet Take 1 tablet (40 mg total) by mouth daily.  30 tablet  5  . [DISCONTINUED] gabapentin (NEURONTIN) 100 MG capsule Take 100 mg by mouth every evening.      . [DISCONTINUED] losartan (COZAAR) 25 MG tablet Take 1 tablet (25 mg total) by mouth daily.  30 tablet  2  . [DISCONTINUED] albuterol (PROVENTIL HFA;VENTOLIN HFA) 108 (90 BASE) MCG/ACT inhaler Inhale 2 puffs into the lungs 2 (two) times daily as needed. For shortness of breath        BP 128/74  Pulse 68  Temp 98 F (36.7 C) (Oral)  Wt 116 lb (52.617 kg)       Objective:   Physical Exam  Constitutional: She appears well-developed and well-nourished. No distress.  Cardiovascular: Normal rate, regular rhythm, normal heart sounds and intact distal pulses.   No murmur  heard. Pulmonary/Chest: Effort normal and breath sounds normal.  Skin: Skin is warm and dry.  Psychiatric: She has a normal mood and affect. Her behavior is normal.          Assessment & Plan:

## 2012-10-24 NOTE — Assessment & Plan Note (Signed)
Patient underwent successful PCI of left subclavian artery stenosis. Continue respiratory management.

## 2012-10-24 NOTE — Assessment & Plan Note (Signed)
Blood pressure significantly improved. I suspect anxiety about her recent procedure played a major role. Continue lolsartan and clonidine.  Monitor BMET. BP: 128/74 mmHg

## 2012-11-03 ENCOUNTER — Ambulatory Visit: Payer: 59 | Admitting: Cardiovascular Disease

## 2012-11-09 ENCOUNTER — Other Ambulatory Visit: Payer: Self-pay | Admitting: *Deleted

## 2012-11-09 MED ORDER — DICYCLOMINE HCL 10 MG PO CAPS: 10.0000 mg | ORAL_CAPSULE | Freq: Three times a day (TID) | ORAL | Status: DC | PRN

## 2012-11-14 ENCOUNTER — Ambulatory Visit (INDEPENDENT_AMBULATORY_CARE_PROVIDER_SITE_OTHER): Payer: 59 | Admitting: Cardiovascular Disease

## 2012-11-14 ENCOUNTER — Encounter: Payer: Self-pay | Admitting: Cardiovascular Disease

## 2012-11-14 VITALS — BP 128/70 | HR 51 | Ht 62.0 in | Wt 117.0 lb

## 2012-11-14 DIAGNOSIS — I771 Stricture of artery: Secondary | ICD-10-CM

## 2012-11-14 MED ORDER — CLOPIDOGREL BISULFATE 75 MG PO TABS: 75.0000 mg | ORAL_TABLET | Freq: Every day | ORAL | Status: DC

## 2012-11-14 NOTE — Progress Notes (Signed)
HPI:  66 year-old woman presenting for followup evaluation. She has peripheral arterial disease with history of symptomatic left subclavian stenosis. She underwent left subclavian stenting on November 20 with good success. She presents today for followup evaluation. She still has some mild discomfort in the left shoulder and back with use of left arm, and no further symptoms of pain into the left arm. She denies lightheadedness, dizziness, or syncope. She's had no chest pain. She does complain of generalized fatigue.  Outpatient Encounter Prescriptions as of 11/14/2012  Medication Sig Dispense Refill  . albuterol (PROAIR HFA) 108 (90 BASE) MCG/ACT inhaler Inhale 2 puffs into the lungs 2 (two) times daily.  1 Inhaler  3  . ALPRAZolam (XANAX) 0.25 MG tablet Take 1 tablet (0.25 mg total) by mouth 2 (two) times daily as needed for sleep or anxiety.  30 tablet  2  . clonazePAM (KLONOPIN) 1 MG tablet Take 1 tablet (1 mg total) by mouth at bedtime as needed. To help rest  30 tablet  3  . cloNIDine (CATAPRES) 0.1 MG tablet Take 0.5 tablets (0.05 mg total) by mouth 2 (two) times daily.  90 tablet  1  . clopidogrel (PLAVIX) 75 MG tablet Take 1 tablet (75 mg total) by mouth daily.  30 tablet  12  . dicyclomine (BENTYL) 10 MG capsule Take 1 capsule (10 mg total) by mouth 3 (three) times daily as needed. For stomach cramps  90 capsule  3  . gabapentin (NEURONTIN) 100 MG capsule Take 2 capsules (200 mg total) by mouth every evening.  60 capsule  3  . hydrochlorothiazide (HYDRODIURIL) 25 MG tablet Take 25 mg by mouth daily.      Marland Kitchen levothyroxine (SYNTHROID) 125 MCG tablet Take 1 tablet (125 mcg total) by mouth daily.  90 tablet  1  . losartan (COZAAR) 25 MG tablet Take 1 tablet (25 mg total) by mouth daily.  90 tablet  1  . morphine (MS CONTIN) 30 MG 12 hr tablet Take 1 tablet (30 mg total) by mouth daily.  30 tablet  0  . ondansetron (ZOFRAN) 4 MG tablet Take 4 mg by mouth every 6 (six) hours as needed. For  nausea/vomiting      . oxyCODONE-acetaminophen (PERCOCET) 10-325 MG per tablet Take 1 tablet by mouth every 12 (twelve) hours as needed. For pain  60 tablet  0  . pantoprazole (PROTONIX) 40 MG tablet Take 1 tablet (40 mg total) by mouth daily.  30 tablet  5    Allergies  Allergen Reactions  . Penicillins Anaphylaxis and Hives    Past Medical History  Diagnosis Date  . Anxiety   . Asthma   . Depression   . Hypertension   . Hypothyroidism   . COPD (chronic obstructive pulmonary disease)   . Fibromyalgia   . Hyperlipidemia   . Arrhythmia      AVNRT  . Anemia    BP 128/70  Pulse 51  Ht 5\' 2"  (1.575 m)  Wt 53.071 kg (117 lb)  BMI 21.40 kg/m2  SpO2 99%  PHYSICAL EXAM: Pt is alert and oriented, NAD HEENT: normal Neck: JVP - normal, carotids 2+= without bruits Lungs: CTA bilaterally CV: RRR without murmur or gallop Abd: soft, NT, Positive BS, no hepatomegaly Ext: no C/C/E, distal pulses intact and equal. Radial pulses are 2+ bilaterally Skin: warm/dry no rash  ASSESSMENT AND PLAN: Left subclavian stenosis status post PTA and stenting. The patient is doing well. Her blood pressures are equal in  both arms and her pulses are equal as well. I would like her to continue Plavix since she's been intolerant to ASA even at a dose of 81 mg. I would like to see her back in 6 months with surveillance duplex scanning prior to her office visit.  [name] 11/14/2012 10:35 AM

## 2012-11-14 NOTE — Patient Instructions (Addendum)
Your physician wants you to follow-up in: 6 months with Dr Excell Seltzer. You will receive a reminder letter in the mail two months in advance. If you don't receive a letter, please call our office to schedule the follow-up appointment.  Your physician has requested that you have a carotid duplex. This test is an ultrasound of the carotid arteries in your neck. It looks at blood flow through these arteries that supply the brain with blood. Allow one hour for this exam. There are no restrictions or special instructions. (IN 6 MONTHS PRIOR TO OFFICE VISIT)

## 2012-11-15 ENCOUNTER — Other Ambulatory Visit: Payer: Self-pay | Admitting: Cardiology

## 2012-11-15 DIAGNOSIS — G458 Other transient cerebral ischemic attacks and related syndromes: Secondary | ICD-10-CM

## 2012-11-16 ENCOUNTER — Other Ambulatory Visit: Payer: Self-pay | Admitting: *Deleted

## 2012-11-16 MED ORDER — ALBUTEROL SULFATE HFA 108 (90 BASE) MCG/ACT IN AERS: 2.0000 | INHALATION_SPRAY | Freq: Two times a day (BID) | RESPIRATORY_TRACT | Status: DC

## 2012-11-17 ENCOUNTER — Ambulatory Visit (INDEPENDENT_AMBULATORY_CARE_PROVIDER_SITE_OTHER): Payer: 59 | Admitting: Internal Medicine

## 2012-11-17 VITALS — BP 152/74 | HR 76 | Temp 98.1°F | Wt 120.0 lb

## 2012-11-17 DIAGNOSIS — F341 Dysthymic disorder: Secondary | ICD-10-CM

## 2012-11-17 DIAGNOSIS — IMO0001 Reserved for inherently not codable concepts without codable children: Secondary | ICD-10-CM

## 2012-11-17 MED ORDER — OXYCODONE-ACETAMINOPHEN 10-325 MG PO TABS: 1.0000 | ORAL_TABLET | Freq: Two times a day (BID) | ORAL | Status: DC | PRN

## 2012-11-17 MED ORDER — MORPHINE SULFATE ER 30 MG PO TBCR: 30.0000 mg | EXTENDED_RELEASE_TABLET | Freq: Every day | ORAL | Status: DC

## 2012-11-17 MED ORDER — ESCITALOPRAM OXALATE 10 MG PO TABS: ORAL_TABLET | ORAL | Status: DC

## 2012-11-17 MED ORDER — PREGABALIN 25 MG PO CAPS: 25.0000 mg | ORAL_CAPSULE | Freq: Two times a day (BID) | ORAL | Status: DC

## 2012-11-17 NOTE — Progress Notes (Signed)
Subjective:    Patient ID: Joann Ray, female    DOB: Sep 28, 1946, 66 y.o.   MRN: [id number]  HPI  66 year old white female with history of fibromyalgia, hypertension and depression/anxiety for followup.  Patient reports increase in life stressors recently. Her son who lives in [place] he is having relationship problems. She has been much more stressed out than usual. She has history of taking Lexapro for years but discontinued this summer when nurse practitioner tried to transition patient to Cymbalta.  She is tearful.  Fibromyalgia-patient and her husband also feel gabapentin is making her feel groggy.  Review of Systems No chang in weight  Past Medical History  Diagnosis Date  . Anxiety   . Asthma   . Depression   . Hypertension   . Hypothyroidism   . COPD (chronic obstructive pulmonary disease)   . Fibromyalgia   . Hyperlipidemia   . Arrhythmia      AVNRT  . Anemia     History   Social History  . Marital Status: Married    Spouse Name: N/A    Number of Children: 3  . Years of Education: N/A   Occupational History  . DISABLED    Social History Main Topics  . Smoking status: Former Smoker    Types: Cigarettes    Quit date: 11/29/2009  . Smokeless tobacco: Never Used     Comment: September 2011, again Oct 2013  . Alcohol Use: No  . Drug Use: No  . Sexually Active: Not on file   Other Topics Concern  . Not on file   Social History Narrative  . No narrative on file    Past Surgical History  Procedure Date  . Appendectomy   . Abdominal hysterectomy   . Nasal sinus surgery 1993  . Cardiac electrophysiology mapping and ablation 08/28/2010    EPS/RFA of AVNRT  - Dr. Sharrell Ku    Family History  Problem Relation Age of Onset  . Colon cancer Sister 59  . Diabetes Mother   . Skin cancer Father   . Breast cancer Paternal Aunt   . Stomach cancer Paternal Aunt   . Stroke Mother   . Dementia Mother     Allergies  Allergen Reactions   . Penicillins Anaphylaxis and Hives    Current Outpatient Prescriptions on File Prior to Visit  Medication Sig Dispense Refill  . albuterol (PROAIR HFA) 108 (90 BASE) MCG/ACT inhaler Inhale 2 puffs into the lungs 2 (two) times daily.  1 Inhaler  3  . ALPRAZolam (XANAX) 0.25 MG tablet Take 1 tablet (0.25 mg total) by mouth 2 (two) times daily as needed for sleep or anxiety.  30 tablet  2  . clonazePAM (KLONOPIN) 1 MG tablet Take 1 tablet (1 mg total) by mouth at bedtime as needed. To help rest  30 tablet  3  . cloNIDine (CATAPRES) 0.1 MG tablet Take 0.5 tablets (0.05 mg total) by mouth 2 (two) times daily.  90 tablet  1  . clopidogrel (PLAVIX) 75 MG tablet Take 1 tablet (75 mg total) by mouth daily.  30 tablet  2  . dicyclomine (BENTYL) 10 MG capsule Take 1 capsule (10 mg total) by mouth 3 (three) times daily as needed. For stomach cramps  90 capsule  3  . hydrochlorothiazide (HYDRODIURIL) 25 MG tablet Take 25 mg by mouth daily.      Marland Kitchen levothyroxine (SYNTHROID) 125 MCG tablet Take 1 tablet (125 mcg total) by mouth daily.  90  tablet  1  . losartan (COZAAR) 25 MG tablet Take 1 tablet (25 mg total) by mouth daily.  90 tablet  1  . ondansetron (ZOFRAN) 4 MG tablet Take 4 mg by mouth every 6 (six) hours as needed. For nausea/vomiting      . pantoprazole (PROTONIX) 40 MG tablet Take 1 tablet (40 mg total) by mouth daily.  30 tablet  5  . pregabalin (LYRICA) 25 MG capsule Take 1 capsule (25 mg total) by mouth 2 (two) times daily.  60 capsule  1    BP 152/74  Pulse 76  Temp 98.1 F (36.7 C) (Oral)  Wt 120 lb (54.432 kg)       Objective:   Physical Exam  Constitutional: She is oriented to person, place, and time. She appears well-developed and well-nourished.  Cardiovascular: Normal rate, regular rhythm and normal heart sounds.   Pulmonary/Chest: Effort normal and breath sounds normal. She has no wheezes.  Neurological: She is alert and oriented to person, place, and time.  Skin: Skin is  warm and dry.  Psychiatric:       tearful          Assessment & Plan:

## 2012-11-17 NOTE — Assessment & Plan Note (Signed)
Restart SSRI-Lexapro. Start at 5 mg for one week then titrate up to 10 mg. She did not tolerate Cymbalta in the past.

## 2012-11-17 NOTE — Assessment & Plan Note (Signed)
Patient complains higher dose of gabapentin making her feel groggy. Trial of Lyrica 25 mg 1-2 tabs at bedtime.

## 2012-11-23 ENCOUNTER — Ambulatory Visit: Payer: 59 | Admitting: Internal Medicine

## 2012-12-04 ENCOUNTER — Other Ambulatory Visit: Payer: Self-pay | Admitting: *Deleted

## 2012-12-04 NOTE — Telephone Encounter (Signed)
Refill alprazolam 0.25 mg 1 tab bid prn for sleep or anxiety

## 2012-12-04 NOTE — Telephone Encounter (Signed)
Ok to refill x 2  

## 2012-12-05 MED ORDER — CLONAZEPAM 1 MG PO TABS: 1.0000 mg | ORAL_TABLET | Freq: Every evening | ORAL | Status: DC | PRN

## 2012-12-05 NOTE — Telephone Encounter (Signed)
rx called in

## 2012-12-15 ENCOUNTER — Ambulatory Visit (INDEPENDENT_AMBULATORY_CARE_PROVIDER_SITE_OTHER): Payer: Medicare PPO | Admitting: Internal Medicine

## 2012-12-15 ENCOUNTER — Encounter: Payer: Self-pay | Admitting: Internal Medicine

## 2012-12-15 VITALS — BP 114/82 | Temp 98.1°F | Wt 119.0 lb

## 2012-12-15 DIAGNOSIS — IMO0001 Reserved for inherently not codable concepts without codable children: Secondary | ICD-10-CM

## 2012-12-15 DIAGNOSIS — F341 Dysthymic disorder: Secondary | ICD-10-CM

## 2012-12-15 DIAGNOSIS — E039 Hypothyroidism, unspecified: Secondary | ICD-10-CM

## 2012-12-15 MED ORDER — OXYCODONE-ACETAMINOPHEN 10-325 MG PO TABS: 1.0000 | ORAL_TABLET | Freq: Two times a day (BID) | ORAL | Status: DC | PRN

## 2012-12-15 MED ORDER — ALPRAZOLAM 0.25 MG PO TABS: 0.2500 mg | ORAL_TABLET | Freq: Two times a day (BID) | ORAL | Status: DC | PRN

## 2012-12-15 MED ORDER — MORPHINE SULFATE ER 15 MG PO TBCR: 30.0000 mg | EXTENDED_RELEASE_TABLET | Freq: Every day | ORAL | Status: DC

## 2012-12-15 MED ORDER — PREGABALIN 50 MG PO CAPS: 50.0000 mg | ORAL_CAPSULE | Freq: Two times a day (BID) | ORAL | Status: DC

## 2012-12-15 NOTE — Assessment & Plan Note (Signed)
Monitor TSH 

## 2012-12-15 NOTE — Progress Notes (Signed)
Subjective:    Patient ID: Joann Ray, female    DOB: 1945-12-26, 67 y.o.   MRN: [id number]  HPI  67 year old white female with fibromyalgia and depression for followup. Since restarting Lexapro patient is feeling much better. Patient also having good response to Lyrica. She is taking 50 mg at bedtime. It has helped her sleep quality.  Patient reports waking up in the morning feeling stiff and achy. She is currently taking morphine ER 30 mg at bedtime and Percocet every 6-8 hours during the day as needed.   Review of Systems Multiple life stressors,  Her sister was admitted for alcohol rehabilitation  Past Medical History  Diagnosis Date  . Anxiety   . Asthma   . Depression   . Hypertension   . Hypothyroidism   . COPD (chronic obstructive pulmonary disease)   . Fibromyalgia   . Hyperlipidemia   . Arrhythmia      AVNRT  . Anemia     History   Social History  . Marital Status: Married    Spouse Name: N/A    Number of Children: 3  . Years of Education: N/A   Occupational History  . DISABLED    Social History Main Topics  . Smoking status: Former Smoker    Types: Cigarettes    Quit date: 11/29/2009  . Smokeless tobacco: Never Used     Comment: September 2011, again Oct 2013  . Alcohol Use: No  . Drug Use: No  . Sexually Active: Not on file   Other Topics Concern  . Not on file   Social History Narrative  . No narrative on file    Past Surgical History  Procedure Date  . Appendectomy   . Abdominal hysterectomy   . Nasal sinus surgery 1993  . Cardiac electrophysiology mapping and ablation 08/28/2010    EPS/RFA of AVNRT  - Dr. Sharrell Ku    Family History  Problem Relation Age of Onset  . Colon cancer Sister 52  . Diabetes Mother   . Skin cancer Father   . Breast cancer Paternal Aunt   . Stomach cancer Paternal Aunt   . Stroke Mother   . Dementia Mother     Allergies  Allergen Reactions  . Penicillins Anaphylaxis and Hives     Current Outpatient Prescriptions on File Prior to Visit  Medication Sig Dispense Refill  . albuterol (PROAIR HFA) 108 (90 BASE) MCG/ACT inhaler Inhale 2 puffs into the lungs 2 (two) times daily.  1 Inhaler  3  . clonazePAM (KLONOPIN) 1 MG tablet Take 1 tablet (1 mg total) by mouth at bedtime as needed. To help rest  30 tablet  2  . cloNIDine (CATAPRES) 0.1 MG tablet Take 0.5 tablets (0.05 mg total) by mouth 2 (two) times daily.  90 tablet  1  . clopidogrel (PLAVIX) 75 MG tablet Take 1 tablet (75 mg total) by mouth daily.  30 tablet  2  . dicyclomine (BENTYL) 10 MG capsule Take 1 capsule (10 mg total) by mouth 3 (three) times daily as needed. For stomach cramps  90 capsule  3  . escitalopram (LEXAPRO) 10 MG tablet Take 1/2 tablet in the morning for 1 week, then 1 tablet in the AM qd  90 tablet  1  . hydrochlorothiazide (HYDRODIURIL) 25 MG tablet Take 25 mg by mouth daily.      Marland Kitchen levothyroxine (SYNTHROID) 125 MCG tablet Take 1 tablet (125 mcg total) by mouth daily.  90 tablet  1  .  losartan (COZAAR) 25 MG tablet Take 1 tablet (25 mg total) by mouth daily.  90 tablet  1  . ondansetron (ZOFRAN) 4 MG tablet Take 4 mg by mouth every 6 (six) hours as needed. For nausea/vomiting      . pantoprazole (PROTONIX) 40 MG tablet Take 1 tablet (40 mg total) by mouth daily.  30 tablet  5  . pregabalin (LYRICA) 50 MG capsule Take 1 capsule (50 mg total) by mouth 2 (two) times daily.  60 capsule  1    BP 114/82  Temp 98.1 F (36.7 C) (Oral)  Wt 119 lb (53.978 kg)       Objective:   Physical Exam  Constitutional: She appears well-developed and well-nourished.  Cardiovascular: Normal rate, regular rhythm and normal heart sounds.   Pulmonary/Chest: Effort normal and breath sounds normal. She has no wheezes.  Skin: Skin is warm and dry.  Psychiatric: She has a normal mood and affect. Her behavior is normal.          Assessment & Plan:

## 2012-12-15 NOTE — Assessment & Plan Note (Signed)
Good response to Lyrica. Increase dose to 50 mg twice daily. Decrease morphine ER 15 mg at bedtime. Patient also encouraged to take half Percocet during the day as needed.

## 2012-12-15 NOTE — Assessment & Plan Note (Signed)
Significantly improved since restarting Lexapro 10 mg once daily.

## 2013-01-12 ENCOUNTER — Ambulatory Visit: Payer: Medicare PPO | Admitting: Internal Medicine

## 2013-01-15 ENCOUNTER — Encounter: Payer: Self-pay | Admitting: Internal Medicine

## 2013-01-15 ENCOUNTER — Ambulatory Visit (INDEPENDENT_AMBULATORY_CARE_PROVIDER_SITE_OTHER): Payer: Medicare PPO | Admitting: Internal Medicine

## 2013-01-15 VITALS — BP 122/74 | HR 60 | Temp 98.1°F | Wt 119.0 lb

## 2013-01-15 DIAGNOSIS — IMO0001 Reserved for inherently not codable concepts without codable children: Secondary | ICD-10-CM

## 2013-01-15 DIAGNOSIS — E039 Hypothyroidism, unspecified: Secondary | ICD-10-CM

## 2013-01-15 MED ORDER — MORPHINE SULFATE ER 15 MG PO TBCR: 15.0000 mg | EXTENDED_RELEASE_TABLET | Freq: Every day | ORAL | Status: DC

## 2013-01-15 MED ORDER — CLONAZEPAM 1 MG PO TABS: 1.0000 mg | ORAL_TABLET | Freq: Every evening | ORAL | Status: DC | PRN

## 2013-01-15 MED ORDER — HYDROCHLOROTHIAZIDE 25 MG PO TABS: 25.0000 mg | ORAL_TABLET | Freq: Every day | ORAL | Status: DC

## 2013-01-15 MED ORDER — PREGABALIN 50 MG PO CAPS: 50.0000 mg | ORAL_CAPSULE | Freq: Every day | ORAL | Status: DC

## 2013-01-15 MED ORDER — ONDANSETRON HCL 4 MG PO TABS: 4.0000 mg | ORAL_TABLET | Freq: Four times a day (QID) | ORAL | Status: DC | PRN

## 2013-01-15 MED ORDER — LOSARTAN POTASSIUM 25 MG PO TABS: 25.0000 mg | ORAL_TABLET | Freq: Every day | ORAL | Status: DC

## 2013-01-15 MED ORDER — OXYCODONE-ACETAMINOPHEN 10-325 MG PO TABS: 1.0000 | ORAL_TABLET | Freq: Two times a day (BID) | ORAL | Status: DC | PRN

## 2013-01-15 NOTE — Progress Notes (Signed)
Subjective:    Patient ID: Joann Ray, female    DOB: [birth year], 67 y.o.   MRN: [id number]  HPI  67 year old white female with chronic fibromyalgia and depression for followup. At previous visit her Lyrica dose was increased to 50 mg twice daily. She was tolerating 50 mg at bedtime. When she takes Lyrica during the day it causes somnolence and weakness.  She feels "swimmy headed".    She also reports wheezing more than usual.   Review of Systems She denies shortness of breath or lower extremity swelling  Past Medical History  Diagnosis Date  . Anxiety   . Asthma   . Depression   . Hypertension   . Hypothyroidism   . COPD (chronic obstructive pulmonary disease)   . Fibromyalgia   . Hyperlipidemia   . Arrhythmia      AVNRT  . Anemia     History   Social History  . Marital Status: Married    Spouse Name: N/A    Number of Children: 3  . Years of Education: N/A   Occupational History  . DISABLED    Social History Main Topics  . Smoking status: Former Smoker    Types: Cigarettes    Quit date: 11/29/2009  . Smokeless tobacco: Never Used     Comment: September 2011, again Oct 2013  . Alcohol Use: No  . Drug Use: No  . Sexually Active: Not on file   Other Topics Concern  . Not on file   Social History Narrative   She has 68 year old granddaughter-Maggie   Diagnosed with juvenile arthritis    Past Surgical History  Procedure Laterality Date  . Appendectomy    . Abdominal hysterectomy    . Nasal sinus surgery  1993  . Cardiac electrophysiology mapping and ablation  08/28/2010    EPS/RFA of AVNRT  - Dr. Sharrell Ku    Family History  Problem Relation Age of Onset  . Colon cancer Sister 35  . Diabetes Mother   . Skin cancer Father   . Breast cancer Paternal Aunt   . Stomach cancer Paternal Aunt   . Stroke Mother   . Dementia Mother     Allergies  Allergen Reactions  . Penicillins Anaphylaxis and Hives    Current Outpatient Prescriptions  on File Prior to Visit  Medication Sig Dispense Refill  . albuterol (PROAIR HFA) 108 (90 BASE) MCG/ACT inhaler Inhale 2 puffs into the lungs 2 (two) times daily.  1 Inhaler  3  . ALPRAZolam (XANAX) 0.25 MG tablet Take 1 tablet (0.25 mg total) by mouth 2 (two) times daily as needed for sleep or anxiety.  60 tablet  2  . cloNIDine (CATAPRES) 0.1 MG tablet Take 0.5 tablets (0.05 mg total) by mouth 2 (two) times daily.  90 tablet  1  . clopidogrel (PLAVIX) 75 MG tablet Take 1 tablet (75 mg total) by mouth daily.  30 tablet  2  . dicyclomine (BENTYL) 10 MG capsule Take 1 capsule (10 mg total) by mouth 3 (three) times daily as needed. For stomach cramps  90 capsule  3  . escitalopram (LEXAPRO) 10 MG tablet Take 1/2 tablet in the morning for 1 week, then 1 tablet in the AM qd  90 tablet  1  . levothyroxine (SYNTHROID) 125 MCG tablet Take 1 tablet (125 mcg total) by mouth daily.  90 tablet  1  . pantoprazole (PROTONIX) 40 MG tablet Take 1 tablet (40 mg total) by mouth daily.  30  tablet  5   No current facility-administered medications on file prior to visit.    BP 122/74  Pulse 60  Temp(Src) 98.1 F (36.7 C) (Oral)  Wt 119 lb (53.978 kg)  BMI 21.76 kg/m2       Objective:   Physical Exam  Constitutional: She is oriented to person, place, and time. She appears well-developed and well-nourished.  Cardiovascular: Normal rate, regular rhythm and normal heart sounds.   No murmur heard. Pulmonary/Chest: Effort normal and breath sounds normal. She has no wheezes.  Neurological: She is alert and oriented to person, place, and time. No cranial nerve deficit.  Psychiatric: She has a normal mood and affect. Her behavior is normal.          Assessment & Plan:

## 2013-01-15 NOTE — Assessment & Plan Note (Addendum)
Patient unable to tolerate Lyrica  50 mg twice daily due to somnolence. Resume 50 mg at bedtime. Continue morphine ER 15 mg once daily and Percocet as needed. Refer to pain management for further recommendations re: decreasing narcotic medication use.  She could not tolerate cymbalta in the past.  We can not use Sivella due to drug interaction with Lyrica.

## 2013-01-15 NOTE — Assessment & Plan Note (Signed)
Patient is euthyroid. Continue same dose of levothyroxine. Lab Results  Component Value Date   TSH 1.76 12/15/2012

## 2013-01-18 ENCOUNTER — Encounter: Payer: Self-pay | Admitting: Physical Medicine and Rehabilitation

## 2013-02-14 ENCOUNTER — Encounter: Payer: Medicare PPO | Attending: Physical Medicine and Rehabilitation | Admitting: Physical Medicine and Rehabilitation

## 2013-02-14 ENCOUNTER — Encounter: Payer: Self-pay | Admitting: Physical Medicine and Rehabilitation

## 2013-02-14 VITALS — BP 148/78 | HR 63 | Resp 14 | Ht 62.0 in | Wt 121.0 lb

## 2013-02-14 DIAGNOSIS — R279 Unspecified lack of coordination: Secondary | ICD-10-CM | POA: Insufficient documentation

## 2013-02-14 DIAGNOSIS — G4700 Insomnia, unspecified: Secondary | ICD-10-CM | POA: Insufficient documentation

## 2013-02-14 DIAGNOSIS — M542 Cervicalgia: Secondary | ICD-10-CM | POA: Insufficient documentation

## 2013-02-14 DIAGNOSIS — F411 Generalized anxiety disorder: Secondary | ICD-10-CM | POA: Insufficient documentation

## 2013-02-14 DIAGNOSIS — R259 Unspecified abnormal involuntary movements: Secondary | ICD-10-CM

## 2013-02-14 DIAGNOSIS — G8929 Other chronic pain: Secondary | ICD-10-CM | POA: Insufficient documentation

## 2013-02-14 DIAGNOSIS — IMO0001 Reserved for inherently not codable concepts without codable children: Secondary | ICD-10-CM

## 2013-02-14 DIAGNOSIS — M545 Low back pain, unspecified: Secondary | ICD-10-CM | POA: Insufficient documentation

## 2013-02-14 DIAGNOSIS — Z79899 Other long term (current) drug therapy: Secondary | ICD-10-CM

## 2013-02-14 DIAGNOSIS — M79609 Pain in unspecified limb: Secondary | ICD-10-CM | POA: Insufficient documentation

## 2013-02-14 DIAGNOSIS — M79604 Pain in right leg: Secondary | ICD-10-CM

## 2013-02-14 DIAGNOSIS — R258 Other abnormal involuntary movements: Secondary | ICD-10-CM

## 2013-02-14 DIAGNOSIS — Z5181 Encounter for therapeutic drug level monitoring: Secondary | ICD-10-CM
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
## 2013-02-14 NOTE — Patient Instructions (Signed)
I understand that you have multiple pain issues.   We focused mainly today on your leg and back pain, which is worse when you are up walking.      I have ordered x rays for your neck and low back. I have ordered physical therapy to help you learn to pace yourself, work on posture, body mechanics and pain management techniques. They will also start some core strengthening and help you get some endurance back into your lower extremities.  I would like to see you in an aquatic pool program eventually.  I would like you to ask Dr. Artist Pais about counseling to help you manage your anxiety.  F/u in 3-4 weeks.

## 2013-02-19 ENCOUNTER — Telehealth: Payer: Self-pay | Admitting: Internal Medicine

## 2013-02-19 NOTE — Telephone Encounter (Signed)
Pain clinic would like you to recommend a counselor for pt for anxiety, stress.

## 2013-02-19 NOTE — Telephone Encounter (Signed)
Refer to PA of Dr. Dub Mikes

## 2013-02-19 NOTE — Telephone Encounter (Signed)
Do they want [place] to refer or do they have someone in mind

## 2013-02-19 NOTE — Telephone Encounter (Signed)
Would like you to refer.

## 2013-02-20 ENCOUNTER — Telehealth: Payer: Self-pay | Admitting: Internal Medicine

## 2013-02-20 NOTE — Telephone Encounter (Signed)
Dr Dub Mikes is no longer taking new patients.  He is now working at the hospital.  Please advise.

## 2013-02-20 NOTE — Telephone Encounter (Signed)
Spoke with patient.

## 2013-02-20 NOTE — Telephone Encounter (Signed)
See if PA from Dr. Loralie Champagne office can see her.

## 2013-02-20 NOTE — Telephone Encounter (Signed)
Patient is aware and will make an appointment

## 2013-02-20 NOTE — Telephone Encounter (Addendum)
Pt said the office number of Dr Dub Mikes states he is no longer there. He is at [facility] full time working with drug abuse pts. Pt will await your call. Pt states she has stress and anxiety.

## 2013-02-28 ENCOUNTER — Ambulatory Visit: Payer: Medicare PPO | Attending: Physical Medicine and Rehabilitation | Admitting: Physical Therapy

## 2013-02-28 DIAGNOSIS — R5381 Other malaise: Secondary | ICD-10-CM | POA: Insufficient documentation

## 2013-02-28 DIAGNOSIS — IMO0001 Reserved for inherently not codable concepts without codable children: Secondary | ICD-10-CM | POA: Insufficient documentation

## 2013-02-28 DIAGNOSIS — M2550 Pain in unspecified joint: Secondary | ICD-10-CM | POA: Insufficient documentation

## 2013-03-07 ENCOUNTER — Ambulatory Visit (HOSPITAL_COMMUNITY)
Admission: RE | Admit: 2013-03-07 | Discharge: 2013-03-07 | Disposition: A | Discharge status: Home / Self Care | Payer: Medicare PPO | Source: Ambulatory Visit | Attending: Physical Medicine and Rehabilitation | Admitting: Physical Medicine and Rehabilitation

## 2013-03-07 DIAGNOSIS — M542 Cervicalgia: Secondary | ICD-10-CM | POA: Insufficient documentation

## 2013-03-07 DIAGNOSIS — I6529 Occlusion and stenosis of unspecified carotid artery: Secondary | ICD-10-CM | POA: Insufficient documentation

## 2013-03-07 DIAGNOSIS — I700 Atherosclerosis of aorta: Secondary | ICD-10-CM | POA: Insufficient documentation

## 2013-03-07 DIAGNOSIS — M899 Disorder of bone, unspecified: Secondary | ICD-10-CM | POA: Insufficient documentation

## 2013-03-07 DIAGNOSIS — M545 Low back pain: Secondary | ICD-10-CM

## 2013-03-07 DIAGNOSIS — R258 Other abnormal involuntary movements: Secondary | ICD-10-CM

## 2013-03-07 DIAGNOSIS — M4120 Other idiopathic scoliosis, site unspecified: Secondary | ICD-10-CM | POA: Insufficient documentation

## 2013-03-07 DIAGNOSIS — G8929 Other chronic pain: Secondary | ICD-10-CM | POA: Insufficient documentation

## 2013-03-07 DIAGNOSIS — IMO0001 Reserved for inherently not codable concepts without codable children: Secondary | ICD-10-CM

## 2013-03-07 DIAGNOSIS — M47817 Spondylosis without myelopathy or radiculopathy, lumbosacral region: Secondary | ICD-10-CM | POA: Insufficient documentation

## 2013-03-07 DIAGNOSIS — M79604 Pain in right leg: Secondary | ICD-10-CM

## 2013-03-12 ENCOUNTER — Ambulatory Visit: Payer: Medicare PPO | Admitting: Internal Medicine

## 2013-03-13 ENCOUNTER — Ambulatory Visit: Payer: Medicare PPO | Admitting: Rehabilitation

## 2013-03-14 ENCOUNTER — Encounter: Payer: Self-pay | Admitting: Physical Medicine and Rehabilitation

## 2013-03-14 ENCOUNTER — Encounter: Payer: Medicare PPO | Attending: Physical Medicine and Rehabilitation | Admitting: Physical Medicine and Rehabilitation

## 2013-03-14 VITALS — BP 140/90 | HR 62 | Resp 14 | Ht 62.0 in | Wt 121.8 lb

## 2013-03-14 DIAGNOSIS — M545 Low back pain, unspecified: Secondary | ICD-10-CM | POA: Insufficient documentation

## 2013-03-14 DIAGNOSIS — R279 Unspecified lack of coordination: Secondary | ICD-10-CM | POA: Insufficient documentation

## 2013-03-14 DIAGNOSIS — F411 Generalized anxiety disorder: Secondary | ICD-10-CM | POA: Insufficient documentation

## 2013-03-14 DIAGNOSIS — G8929 Other chronic pain: Secondary | ICD-10-CM | POA: Insufficient documentation

## 2013-03-14 DIAGNOSIS — Z5181 Encounter for therapeutic drug level monitoring: Secondary | ICD-10-CM

## 2013-03-14 DIAGNOSIS — M542 Cervicalgia: Secondary | ICD-10-CM | POA: Insufficient documentation

## 2013-03-14 DIAGNOSIS — M79609 Pain in unspecified limb: Secondary | ICD-10-CM

## 2013-03-14 DIAGNOSIS — M79604 Pain in right leg: Secondary | ICD-10-CM

## 2013-03-14 DIAGNOSIS — G4700 Insomnia, unspecified: Secondary | ICD-10-CM | POA: Insufficient documentation

## 2013-03-14 MED ORDER — MORPHINE SULFATE ER 15 MG PO TBCR: 15.0000 mg | EXTENDED_RELEASE_TABLET | Freq: Every day | ORAL | Status: DC

## 2013-03-14 MED ORDER — OXYCODONE-ACETAMINOPHEN 10-325 MG PO TABS: 1.0000 | ORAL_TABLET | Freq: Two times a day (BID) | ORAL | Status: DC | PRN
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
## 2013-03-14 NOTE — Patient Instructions (Addendum)
I have refilled your pain medications.   Please keep them locked up and in a secure location.  I will call Dr. Artist Pais to discuss your case further.  I understand you will be seeing Dr. Excell Seltzer in June.    I have given you some information on [facility] [employer] for low back pain.  You will follow up with Dr. Wynn Banker in one month.

## 2013-03-15 ENCOUNTER — Ambulatory Visit: Payer: Medicare PPO | Admitting: Internal Medicine

## 2013-03-15 ENCOUNTER — Ambulatory Visit: Payer: Medicare PPO | Admitting: Physical Therapy

## 2013-03-20 ENCOUNTER — Ambulatory Visit: Payer: Medicare PPO | Admitting: Internal Medicine

## 2013-04-10 ENCOUNTER — Ambulatory Visit: Payer: Medicare PPO | Admitting: Physical Medicine & Rehabilitation

## 2013-04-10 ENCOUNTER — Encounter: Payer: Medicare PPO | Attending: Physical Medicine and Rehabilitation | Admitting: Physical Medicine and Rehabilitation

## 2013-04-10 ENCOUNTER — Encounter: Payer: Self-pay | Admitting: Physical Medicine and Rehabilitation

## 2013-04-10 ENCOUNTER — Ambulatory Visit: Payer: Medicare PPO | Admitting: Internal Medicine

## 2013-04-10 VITALS — BP 129/72 | HR 74 | Resp 16 | Ht 62.0 in | Wt 117.0 lb

## 2013-04-10 DIAGNOSIS — E039 Hypothyroidism, unspecified: Secondary | ICD-10-CM | POA: Insufficient documentation

## 2013-04-10 DIAGNOSIS — G8929 Other chronic pain: Secondary | ICD-10-CM | POA: Insufficient documentation

## 2013-04-10 DIAGNOSIS — G4700 Insomnia, unspecified: Secondary | ICD-10-CM | POA: Insufficient documentation

## 2013-04-10 DIAGNOSIS — M25519 Pain in unspecified shoulder: Secondary | ICD-10-CM | POA: Insufficient documentation

## 2013-04-10 DIAGNOSIS — I708 Atherosclerosis of other arteries: Secondary | ICD-10-CM | POA: Insufficient documentation

## 2013-04-10 DIAGNOSIS — M545 Low back pain, unspecified: Secondary | ICD-10-CM | POA: Insufficient documentation

## 2013-04-10 DIAGNOSIS — R252 Cramp and spasm: Secondary | ICD-10-CM | POA: Insufficient documentation

## 2013-04-10 DIAGNOSIS — R6889 Other general symptoms and signs: Secondary | ICD-10-CM | POA: Insufficient documentation

## 2013-04-10 DIAGNOSIS — I700 Atherosclerosis of aorta: Secondary | ICD-10-CM | POA: Insufficient documentation

## 2013-04-10 DIAGNOSIS — E785 Hyperlipidemia, unspecified: Secondary | ICD-10-CM | POA: Insufficient documentation

## 2013-04-10 DIAGNOSIS — F411 Generalized anxiety disorder: Secondary | ICD-10-CM | POA: Insufficient documentation

## 2013-04-10 DIAGNOSIS — J449 Chronic obstructive pulmonary disease, unspecified: Secondary | ICD-10-CM | POA: Insufficient documentation

## 2013-04-10 DIAGNOSIS — M79609 Pain in unspecified limb: Secondary | ICD-10-CM | POA: Insufficient documentation

## 2013-04-10 DIAGNOSIS — I10 Essential (primary) hypertension: Secondary | ICD-10-CM | POA: Insufficient documentation

## 2013-04-10 DIAGNOSIS — J4489 Other specified chronic obstructive pulmonary disease: Secondary | ICD-10-CM | POA: Insufficient documentation

## 2013-04-10 DIAGNOSIS — M542 Cervicalgia: Secondary | ICD-10-CM | POA: Insufficient documentation

## 2013-04-10 DIAGNOSIS — M47817 Spondylosis without myelopathy or radiculopathy, lumbosacral region: Secondary | ICD-10-CM

## 2013-04-10 MED ORDER — MORPHINE SULFATE ER 15 MG PO TBCR: 15.0000 mg | EXTENDED_RELEASE_TABLET | Freq: Every day | ORAL | Status: DC

## 2013-04-10 MED ORDER — OXYCODONE-ACETAMINOPHEN 10-325 MG PO TABS: 1.0000 | ORAL_TABLET | Freq: Two times a day (BID) | ORAL | Status: DC | PRN

## 2013-04-10 NOTE — Patient Instructions (Signed)
Stay as active as pain allows
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
## 2013-04-17 ENCOUNTER — Ambulatory Visit (INDEPENDENT_AMBULATORY_CARE_PROVIDER_SITE_OTHER): Payer: Medicare PPO | Admitting: Internal Medicine

## 2013-04-17 ENCOUNTER — Encounter: Payer: Self-pay | Admitting: Internal Medicine

## 2013-04-17 VITALS — BP 146/82 | HR 60 | Temp 98.0°F | Wt 121.0 lb

## 2013-04-17 DIAGNOSIS — M79609 Pain in unspecified limb: Secondary | ICD-10-CM

## 2013-04-17 DIAGNOSIS — H02402 Unspecified ptosis of left eyelid: Secondary | ICD-10-CM

## 2013-04-17 DIAGNOSIS — R51 Headache: Secondary | ICD-10-CM

## 2013-04-17 DIAGNOSIS — F341 Dysthymic disorder: Secondary | ICD-10-CM

## 2013-04-17 DIAGNOSIS — I10 Essential (primary) hypertension: Secondary | ICD-10-CM

## 2013-04-17 DIAGNOSIS — H02409 Unspecified ptosis of unspecified eyelid: Secondary | ICD-10-CM

## 2013-04-17 DIAGNOSIS — M79604 Pain in right leg: Secondary | ICD-10-CM

## 2013-04-17 DIAGNOSIS — IMO0001 Reserved for inherently not codable concepts without codable children: Secondary | ICD-10-CM

## 2013-04-17 DIAGNOSIS — E039 Hypothyroidism, unspecified: Secondary | ICD-10-CM

## 2013-04-17 MED ORDER — ALBUTEROL SULFATE HFA 108 (90 BASE) MCG/ACT IN AERS: 2.0000 | INHALATION_SPRAY | Freq: Two times a day (BID) | RESPIRATORY_TRACT | Status: DC

## 2013-04-17 NOTE — Assessment & Plan Note (Signed)
Patient establishment pain management. We are working towards decreasing narcotic medication use. 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

## 2013-04-17 NOTE — Assessment & Plan Note (Signed)
Monitor thyroid function studies

## 2013-04-17 NOTE — Assessment & Plan Note (Signed)
Patient referred to Dr. Emerson Monte but she does not accept her insurance. Refer to Dr. Runell Gess physician assistant for counseling.

## 2013-04-17 NOTE — Progress Notes (Signed)
Subjective:    Patient ID: Joann Ray, female    DOB: [birth year], 67 y.o.   MRN: [id number]  HPI  67 year old white female with chronic fibromyalgia, anxiety/depression and hypothyroidism for followup. Since previous visit patient was seen by pain management specialist. Unfortunately Dr. Junie Spencer is leaving her practice. Currently she feels comfortable seeing her nurse practitioner. Patient complains of lower leg pains. Her symptoms worse with activity. She has history of peripheral vascular disease.  Patient also complains of buzzing sensation in her head. She also has intermittent left eyelid droop. She has chronic fatigue but notes her strength is worse with activity.  Review of Systems Intermittent headaches,  Tenderness of left temple area    Past Medical History  Diagnosis Date  . Anxiety   . Asthma   . Depression   . Hypertension   . Hypothyroidism   . COPD (chronic obstructive pulmonary disease)   . Fibromyalgia   . Hyperlipidemia   . Arrhythmia      AVNRT  . Anemia     History   Social History  . Marital Status: Married    Spouse Name: N/A    Number of Children: 3  . Years of Education: N/A   Occupational History  . DISABLED    Social History Main Topics  . Smoking status: Former Smoker    Types: Cigarettes    Quit date: 11/29/2009  . Smokeless tobacco: Never Used     Comment: September 2011, again Oct 2013  . Alcohol Use: No  . Drug Use: No  . Sexually Active: Not on file   Other Topics Concern  . Not on file   Social History Narrative   She has 36 year old granddaughter-Maggie   Diagnosed with juvenile arthritis    Past Surgical History  Procedure Laterality Date  . Appendectomy    . Abdominal hysterectomy    . Nasal sinus surgery  1993  . Cardiac electrophysiology mapping and ablation  08/28/2010    EPS/RFA of AVNRT  - Dr. Sharrell Ku    Family History  Problem Relation Age of Onset  . Colon cancer Sister 40  . Diabetes  Mother   . Skin cancer Father   . Breast cancer Paternal Aunt   . Stomach cancer Paternal Aunt   . Stroke Mother   . Dementia Mother     Allergies  Allergen Reactions  . Penicillins Anaphylaxis and Hives    Current Outpatient Prescriptions on File Prior to Visit  Medication Sig Dispense Refill  . ALPRAZolam (XANAX) 0.25 MG tablet Take 1 tablet (0.25 mg total) by mouth 2 (two) times daily as needed for sleep or anxiety.  60 tablet  2  . clonazePAM (KLONOPIN) 1 MG tablet Take 1 tablet (1 mg total) by mouth at bedtime as needed. To help rest  30 tablet  5  . cloNIDine (CATAPRES) 0.1 MG tablet Take 0.5 tablets (0.05 mg total) by mouth 2 (two) times daily.  90 tablet  1  . clopidogrel (PLAVIX) 75 MG tablet Take 1 tablet (75 mg total) by mouth daily.  30 tablet  2  . dicyclomine (BENTYL) 10 MG capsule Take 1 capsule (10 mg total) by mouth 3 (three) times daily as needed. For stomach cramps  90 capsule  3  . escitalopram (LEXAPRO) 10 MG tablet Take 1/2 tablet in the morning for 1 week, then 1 tablet in the AM qd  90 tablet  1  . hydrochlorothiazide (HYDRODIURIL) 25 MG tablet Take 1 tablet (25  mg total) by mouth daily.  90 tablet  1  . levothyroxine (SYNTHROID) 125 MCG tablet Take 1 tablet (125 mcg total) by mouth daily.  90 tablet  1  . losartan (COZAAR) 25 MG tablet Take 1 tablet (25 mg total) by mouth daily.  90 tablet  1  . morphine (MS CONTIN) 15 MG 12 hr tablet Take 1 tablet (15 mg total) by mouth daily.  30 tablet  0  . ondansetron (ZOFRAN) 4 MG tablet Take 1 tablet (4 mg total) by mouth every 6 (six) hours as needed. For nausea/vomiting  30 tablet  2  . oxyCODONE-acetaminophen (PERCOCET) 10-325 MG per tablet Take 1 tablet by mouth every 12 (twelve) hours as needed. For pain  60 tablet  0  . pantoprazole (PROTONIX) 40 MG tablet Take 1 tablet (40 mg total) by mouth daily.  30 tablet  5  . pregabalin (LYRICA) 50 MG capsule Take 1 capsule (50 mg total) by mouth at bedtime.  90 capsule  1    No current facility-administered medications on file prior to visit.    BP 146/82  Pulse 60  Temp(Src) 98 F (36.7 C) (Oral)  Wt 121 lb (54.885 kg)  BMI 22.13 kg/m2    Objective:   Physical Exam  Constitutional: She is oriented to person, place, and time. She appears well-developed and well-nourished.  HENT:  Head: Normocephalic and atraumatic.  Right Ear: External ear normal.  Left Ear: External ear normal.  Eyes: EOM are normal. Pupils are equal, round, and reactive to light.  Cardiovascular: Normal rate, regular rhythm and normal heart sounds.   No murmur heard. Diminished pulses in her lower extremities  Pulmonary/Chest: Effort normal and breath sounds normal. She has no wheezes.  Musculoskeletal: She exhibits no edema.  Neurological: She is alert and oriented to person, place, and time. No cranial nerve deficit.  Skin: Skin is warm and dry.  Psychiatric: She has a normal mood and affect. Her behavior is normal.          Assessment & Plan:

## 2013-04-17 NOTE — Assessment & Plan Note (Signed)
She has faint pulses in her lower extremities. Obtain ABI.

## 2013-04-17 NOTE — Assessment & Plan Note (Signed)
Patient has unexplained left eyelid droop. Unclear whether her fatigue symptoms are from her chronic fatigue syndrome versus possible myasthenia gravis. Refer to neurologist for further evaluation. Considering left temporal headache, check sedimentation rate. Also check acetylcholine receptor antibodies.  Defer MRI of brain to neurologist.

## 2013-04-18 LAB — BASIC METABOLIC PANEL
BUN: 10 mg/dL (ref 6–23)
CO2: 28 mEq/L (ref 19–32)
Chloride: 99 mEq/L (ref 96–112)
GFR: 87.89 mL/min (ref >60.00)
Glucose, Bld: 78 mg/dL (ref 70–99)
Potassium: 4.4 mEq/L (ref 3.5–5.1)
Sodium: 137 mEq/L (ref 135–145)

## 2013-04-20 ENCOUNTER — Ambulatory Visit (INDEPENDENT_AMBULATORY_CARE_PROVIDER_SITE_OTHER): Payer: Medicare PPO | Admitting: Cardiology

## 2013-04-20 DIAGNOSIS — M79609 Pain in unspecified limb: Secondary | ICD-10-CM

## 2013-04-20 DIAGNOSIS — I739 Peripheral vascular disease, unspecified: Secondary | ICD-10-CM

## 2013-04-20 DIAGNOSIS — M79605 Pain in left leg: Secondary | ICD-10-CM

## 2013-04-20 DIAGNOSIS — I70219 Atherosclerosis of native arteries of extremities with intermittent claudication, unspecified extremity: Secondary | ICD-10-CM

## 2013-04-25 ENCOUNTER — Ambulatory Visit (INDEPENDENT_AMBULATORY_CARE_PROVIDER_SITE_OTHER): Payer: Medicare PPO | Admitting: Licensed Clinical Social Worker

## 2013-04-25 DIAGNOSIS — F411 Generalized anxiety disorder: Secondary | ICD-10-CM

## 2013-04-30 ENCOUNTER — Encounter: Payer: Self-pay | Admitting: Internal Medicine

## 2013-05-02 ENCOUNTER — Encounter: Payer: Self-pay | Admitting: Diagnostic Neuroimaging

## 2013-05-02 ENCOUNTER — Ambulatory Visit (INDEPENDENT_AMBULATORY_CARE_PROVIDER_SITE_OTHER): Payer: Medicare PPO | Admitting: Diagnostic Neuroimaging

## 2013-05-02 VITALS — BP 128/66 | HR 58 | Temp 97.6°F | Ht 62.0 in | Wt 118.0 lb

## 2013-05-02 DIAGNOSIS — H532 Diplopia: Secondary | ICD-10-CM

## 2013-05-02 DIAGNOSIS — R531 Weakness: Secondary | ICD-10-CM

## 2013-05-02 DIAGNOSIS — R5381 Other malaise: Secondary | ICD-10-CM

## 2013-05-02 DIAGNOSIS — H02409 Unspecified ptosis of unspecified eyelid: Secondary | ICD-10-CM

## 2013-05-02 DIAGNOSIS — H02402 Unspecified ptosis of left eyelid: Secondary | ICD-10-CM

## 2013-05-02 DIAGNOSIS — R1319 Other dysphagia: Secondary | ICD-10-CM

## 2013-05-02 MED ORDER — PYRIDOSTIGMINE BROMIDE 60 MG PO TABS: 30.0000 mg | ORAL_TABLET | Freq: Three times a day (TID) | ORAL | Status: DC

## 2013-05-02 NOTE — Patient Instructions (Addendum)
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  Please start pyridostimgine 30mg  3 times per day.  Suanne Marker, MD 05/02/2013, 9:52 AM Certified in Neurology, Neurophysiology and Neuroimaging  [facility] [address], [address] [place], [place] [address] [phone]    Myasthenia Gravis Myasthenia gravis is a disease that causes muscle weakness throughout the body. The muscles affected are the ones we can control (voluntary muscles). An example of a voluntary muscle is your hand muscles. You can control the muscles to make the hand pick something up. An example of an involuntary muscle is the heart. The heart beats without any direction from you.  Myasthenia Gravis is thought to be an autoimmune disease. That means that normal defenses of the body begin to attack the body. In this case, the immune system begins to attack cells located at the junctions of the muscles and the nerves. Women are affected more often. Women are affected at a younger age than men. Babies born to affected women frequently develop symptoms at an early age. SYMPTOMS Initially in the disease, the facial muscles are affected first. After this, a person may develop droopy eyelids. They may have difficulty controlling facial muscles. They may have problems chewing. Swallowing and speaking may become impaired. The weakness gradually spreads to the arms and legs. It begins to affect breathing. Sometimes, the symptoms lessen or go away without any apparent cause. DIAGNOSIS  Diagnosis can be made with blood tests. Tests such as electromyography may be done to examine the electrical activity in the muscle. An improvement in symptoms after having an anti-cholinesterase drug helps confirm the diagnosis.  TREATMENT  Medicines are usually prescribed as the first treatment. These medicines help, but they do not cure the disease. A plasma cleansing procedure  (plasmapheresis) can be used to treat a crisis. It can also be used to prepare a person for surgery. This procedure produces short-term improvement. Some cases are helped by removing the thymus gland. Steroids are used for short-term benefits. Document Released: 02/21/2001 Document Revised: 02/07/2012 Document Reviewed: 11/15/2005 [facility] Patient Information 2014 [place], [place].

## 2013-05-02 NOTE — Progress Notes (Signed)
[facility]  PATIENT: Joann Ray DOB: October 22, 1946  REFERRING CLINICIAN: Artist Pais HISTORY FROM: patient and husband REASON FOR VISIT: new consult   HISTORICAL  CHIEF COMPLAINT:  Chief Complaint  Patient presents with  . Eye Problem    pressure behind L eye  . Neurologic Problem    constant buzzing in head    HISTORY OF PRESENT ILLNESS:  67 year old Caucasian female who comes in for consult of left drooping eyelid.  She has a PMH of fibromyalgia, chronic fatigue syndrome, anxiety, depression, COPD, hyperlipidemia, and hypothyroidism.  She is married, accompanied by her spouse today.  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  Drooping left eyelid that is intermittent over the last 5 years.  During these times, her speech sounds different, the volume is low, but it is not slurred speech.  Pt states that it feels like swallowing is more difficult.  She feels more generalized fatigue when her eye is drooping.  When she has one of these episodes, it may not go away until the next day. Sometimes late in the evening she experiences double vision, in an up-and -down pattern.  For the past 15 years patient hears buzzing sound in head like bumble bees which is intermittent and very annoying.  She states she has intermittent numbness in her Left arms and legs and muscle cramps in both of her legs.  She feels pressure behind her left eye at times.  Overall, she feels very fatigued every day, some days are better than others, but she does not have the energy to do the things she wants to do.  REVIEW OF SYSTEMS: Full 14 system review of systems performed and notable only for fatigue swelling in legs blurred vision eye pain and wheezing diarrhea feeling of feeling cold flushing joint pain joint swelling cramps aching muscles allergies insensitivity confusion headache numbness weakness  dizziness tremor or sleepiness depression anxiety not asleep decreased energy disinterest activities racing thoughts.  ALLERGIES: Allergies  Allergen Reactions  . Penicillins Anaphylaxis and Hives    HOME MEDICATIONS: Outpatient Prescriptions Prior to Visit  Medication Sig Dispense Refill  . albuterol (PROAIR HFA) 108 (90 BASE) MCG/ACT inhaler Inhale 2 puffs into the lungs 2 (two) times daily.  1 Inhaler  5  . ALPRAZolam (XANAX) 0.25 MG tablet Take 1 tablet (0.25 mg total) by mouth 2 (two) times daily as needed for sleep or anxiety.  60 tablet  2  . clonazePAM (KLONOPIN) 1 MG tablet Take 1 tablet (1 mg total) by mouth at bedtime as needed. To help rest  30 tablet  5  . cloNIDine (CATAPRES) 0.1 MG tablet Take 0.5 tablets (0.05 mg total) by mouth 2 (two) times daily.  90 tablet  1  . clopidogrel (PLAVIX) 75 MG tablet Take 1 tablet (75 mg total) by mouth daily.  30 tablet  2  . dicyclomine (BENTYL) 10 MG capsule Take 1 capsule (10 mg total) by mouth 3 (three) times daily as needed. For stomach cramps  90 capsule  3  . escitalopram (LEXAPRO) 10 MG tablet Take 1/2 tablet in the morning for 1 week, then 1 tablet in the AM qd  90 tablet  1  . hydrochlorothiazide (HYDRODIURIL) 25 MG tablet Take 1 tablet (25 mg total) by mouth daily.  90 tablet  1  . levothyroxine (SYNTHROID) 125 MCG tablet Take 1 tablet (125 mcg total) by  mouth daily.  90 tablet  1  . losartan (COZAAR) 25 MG tablet Take 1 tablet (25 mg total) by mouth daily.  90 tablet  1  . morphine (MS CONTIN) 15 MG 12 hr tablet Take 1 tablet (15 mg total) by mouth daily.  30 tablet  0  . ondansetron (ZOFRAN) 4 MG tablet Take 1 tablet (4 mg total) by mouth every 6 (six) hours as needed. For nausea/vomiting  30 tablet  2  . oxyCODONE-acetaminophen (PERCOCET) 10-325 MG per tablet Take 1 tablet by mouth every 12 (twelve) hours as needed. For pain  60 tablet  0  . pantoprazole (PROTONIX) 40 MG tablet Take 1 tablet (40 mg total) by mouth daily.  30  tablet  5  . pregabalin (LYRICA) 50 MG capsule Take 1 capsule (50 mg total) by mouth at bedtime.  90 capsule  1   No facility-administered medications prior to visit.    PAST MEDICAL HISTORY: Past Medical History  Diagnosis Date  . Anxiety   . Asthma   . Depression   . Hypertension   . Hypothyroidism   . COPD (chronic obstructive pulmonary disease)   . Fibromyalgia   . Hyperlipidemia   . Arrhythmia      AVNRT  . Anemia   . Migraine     PAST SURGICAL HISTORY: Past Surgical History  Procedure Laterality Date  . Appendectomy    . Abdominal hysterectomy    . Nasal sinus surgery  1993  . Cardiac electrophysiology mapping and ablation  08/28/2010    EPS/RFA of AVNRT  - Dr. Sharrell Ku  . Coronary stent placement  09/2012    FAMILY HISTORY: Family History  Problem Relation Age of Onset  . Colon cancer Sister 75  . Diabetes Mother   . Stroke Mother   . Dementia Mother   . Skin cancer Father   . Breast cancer Paternal Aunt   . Stomach cancer Paternal Aunt   . Stroke Sister     SOCIAL HISTORY: History   Social History  . Marital Status: Married    Spouse Name: Sammy    Number of Children: 3  . Years of Education: 12th/[employer]    Occupational History  . DISABLED    Social History Main Topics  . Smoking status: Former Smoker -- 0.50 packs/day for 40 years    Types: Cigarettes    Quit date: 11/29/2009  . Smokeless tobacco: Never Used     Comment: September 2011, again Oct 2013  . Alcohol Use: No  . Drug Use: No  . Sexually Active: Not on file   Other Topics Concern  . Not on file   Social History Narrative   She has 52 year old granddaughter-Maggie   Diagnosed with juvenile arthritis   Pt lives at home with spouse.    Caffeine Use:2-3 cups daily.     PHYSICAL EXAM  Filed Vitals:   05/02/13 0851  BP: 128/66  Pulse: 58  Temp: 97.6 F (36.4 C)  TempSrc: Oral  Height: 5\' 2"  (1.575 m)  Weight: 118 lb (53.524 kg)    Not recorded    Body mass  index is 21.58 kg/(m^2).  GENERAL EXAM: Patient is in no distress  CARDIOVASCULAR: Regular rate and rhythm, no murmurs, no carotid bruits  NEUROLOGIC: MENTAL STATUS: awake, alert, language fluent, comprehension intact, naming intact CRANIAL NERVE: no papilledema on fundoscopic exam, pupils equal and reactive to light, visual fields full to confrontation, extraocular muscles intact, no nystagmus, facial sensation and strength  symmetric, uvula midline, shoulder shrug symmetric, tongue midline. LET PTOSIS, FLUCTUATES. AFTER 1 MINUTE UPGAZE, PATIENT DEVELOPS SUBJECTIVE, BINOCULAR DOUBLE VISION ON LEFT GAZE. MOTOR: normal bulk and tone, full strength in the BUE, BLE before exercise; AFTER SIT/STAND X 10, WHICH SHE BARELY ABLE TO DO, THEN PATIENT HAS SIGNIFICANT WEAKNESS IN BLE (3/5).  SENSORY: normal and symmetric to light touch, pinprick, temperature; DECR VIB AT TOES (<5 SEC) COORDINATION: finger-nose-finger, fine finger movements normal REFLEXES: deep tendon reflexes present and symmetric GAIT/STATION: narrow based gait; SLOW, CAUTIOUS. able to walk on toes, heels and tandem; romberg is negative   DIAGNOSTIC DATA (LABS, IMAGING, TESTING) - I reviewed patient records, labs, notes, testing and imaging myself where available.  Lab Results  Component Value Date   WBC 8.1 10/12/2012   HGB 12.5 10/12/2012   HCT 37.6 10/12/2012   MCV 92.7 10/12/2012   PLT 294.0 10/12/2012      Component Value Date/Time   NA 137 04/17/2013 1644   K 4.4 04/17/2013 1644   CL 99 04/17/2013 1644   CO2 28 04/17/2013 1644   GLUCOSE 78 04/17/2013 1644   BUN 10 04/17/2013 1644   CREATININE 0.7 04/17/2013 1644   CREATININE 0.83 04/13/2011 1142   CALCIUM 9.0 04/17/2013 1644   PROT 8.0 05/06/2011 1139   ALBUMIN 4.6 05/06/2011 1139   AST 32 05/06/2011 1139   ALT 33 05/06/2011 1139   ALKPHOS 97 05/06/2011 1139   BILITOT 0.4 05/06/2011 1139   GFRNONAA >60 05/06/2011 1139   GFRAA  Value: >60        The eGFR has been calculated using  the MDRD equation. This calculation has not been validated in all clinical situations. eGFR's persistently <60 mL/min signify possible Chronic Kidney Disease. 05/06/2011 1139   No results found for this basename: CHOL, HDL, LDLCALC, LDLDIRECT, TRIG, CHOLHDL   No results found for this basename: HGBA1C   Lab Results  Component Value Date   VITAMINB12 398 06/07/2011   Lab Results  Component Value Date   TSH 1.88 04/17/2013   SED RATE 18   04/17/13  ACHR binding 0.03  CERVICAL SPINE COMPLETE WITH FLEXION AND EXTENSION VIEWS, 02/14/13: Negative cervical spine. No evidence of dynamic instability.  LUMBAR SPINE - 2-3 VIEW 02/14/13: No acute osseous abnormality in the lumbar spine. Osteopenia.  Moderate L4-L5 and L5-S1 facet degeneration  CT ANGIOGRAPHY OF THE LEFT UPPER EXTREMITY 08/21/12: 1. 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 Segmental lesion demonstrates critical stenosis and near subtotal occlusion and consists primarily of  noncalcified plaque.  2. No other significant occlusive disease is identified in the left upper extremity. The left radial artery shows high origin and  is very small in caliber with dominant inflow into the hand consisting of a widely patent ulnar artery.  3. Incidental evidence on the study of calcified coronary artery plaque and left external iliac artery disease.  CT ANGIOGRAPHY NECK 08/21/12: 1. 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  2. Mild central wall thickening at the origin of the proximal left common carotid artery without significant stenosis.  3. High-grade stenosis of the proximal left vertebral artery.  4. 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  5. Atherosclerotic changes of the proximal internal carotid arteries bilaterally  without significant stenoses.  ASSESSMENT AND PLAN  67 y.o.  year old female  has a past medical history of Anxiety; Asthma; Depression; Hypertension; Hypothyroidism; COPD (chronic obstructive pulmonary disease); Fibromyalgia; Hyperlipidemia; Arrhythmia; Anemia; and Migraine. here with fatigable weakness, intermittent ptosis, trouble swallowing. History and exam findings are highly suspicious for neuromuscular junction disorder such as generalized myasthenia gravis. Acetylcholine receptor antibody testing is negative (which can be negative in 10-20% of patients with MG). Of these patients, about 40-50% have anti-MUSK antibodies. Other considerations would include lambert-eaton syndrome, myopathy, or motor neuropathy.  The patient has other constellation of symptoms including diffuse pain, depression, anxiety, which would not be related to myasthenia gravis. This may fit better with fibromyalgia and other mood disturbances.  PLAN: 1. EMG/nerve conduction with repetitive stimulation 2. Anti-MUSK antibodies 3. Trial of pyridostigmine for myasthenia gravis    Suanne Marker, MD 05/02/2013, 9:16 AM Certified in Neurology, Neurophysiology and Neuroimaging  [facility] [address], [address] [place], [place] [address] [phone]

## 2013-05-03 DIAGNOSIS — H02409 Unspecified ptosis of unspecified eyelid: Secondary | ICD-10-CM | POA: Insufficient documentation

## 2013-05-03 DIAGNOSIS — R1319 Other dysphagia: Secondary | ICD-10-CM | POA: Insufficient documentation

## 2013-05-03 DIAGNOSIS — H532 Diplopia: Secondary | ICD-10-CM | POA: Insufficient documentation

## 2013-05-08 ENCOUNTER — Encounter: Payer: Self-pay | Admitting: Internal Medicine

## 2013-05-10 ENCOUNTER — Encounter: Payer: Self-pay | Admitting: Physical Medicine and Rehabilitation

## 2013-05-10 ENCOUNTER — Encounter: Payer: Medicare PPO | Attending: Physical Medicine and Rehabilitation | Admitting: Physical Medicine and Rehabilitation

## 2013-05-10 VITALS — BP 135/71 | HR 64 | Resp 14 | Ht 62.0 in | Wt 118.0 lb

## 2013-05-10 DIAGNOSIS — I10 Essential (primary) hypertension: Secondary | ICD-10-CM | POA: Insufficient documentation

## 2013-05-10 DIAGNOSIS — Z5181 Encounter for therapeutic drug level monitoring: Secondary | ICD-10-CM

## 2013-05-10 DIAGNOSIS — J449 Chronic obstructive pulmonary disease, unspecified: Secondary | ICD-10-CM | POA: Insufficient documentation

## 2013-05-10 DIAGNOSIS — M545 Low back pain, unspecified: Secondary | ICD-10-CM | POA: Insufficient documentation

## 2013-05-10 DIAGNOSIS — E7529 Other sphingolipidosis: Secondary | ICD-10-CM | POA: Insufficient documentation

## 2013-05-10 DIAGNOSIS — F411 Generalized anxiety disorder: Secondary | ICD-10-CM | POA: Insufficient documentation

## 2013-05-10 DIAGNOSIS — M79609 Pain in unspecified limb: Secondary | ICD-10-CM | POA: Insufficient documentation

## 2013-05-10 DIAGNOSIS — E785 Hyperlipidemia, unspecified: Secondary | ICD-10-CM | POA: Insufficient documentation

## 2013-05-10 DIAGNOSIS — Z79899 Other long term (current) drug therapy: Secondary | ICD-10-CM

## 2013-05-10 DIAGNOSIS — F17200 Nicotine dependence, unspecified, uncomplicated: Secondary | ICD-10-CM | POA: Insufficient documentation

## 2013-05-10 DIAGNOSIS — J4489 Other specified chronic obstructive pulmonary disease: Secondary | ICD-10-CM | POA: Insufficient documentation

## 2013-05-10 DIAGNOSIS — R252 Cramp and spasm: Secondary | ICD-10-CM | POA: Insufficient documentation

## 2013-05-10 DIAGNOSIS — M47817 Spondylosis without myelopathy or radiculopathy, lumbosacral region: Secondary | ICD-10-CM

## 2013-05-10 DIAGNOSIS — E039 Hypothyroidism, unspecified: Secondary | ICD-10-CM | POA: Insufficient documentation

## 2013-05-10 DIAGNOSIS — M25519 Pain in unspecified shoulder: Secondary | ICD-10-CM | POA: Insufficient documentation

## 2013-05-10 DIAGNOSIS — G8929 Other chronic pain: Secondary | ICD-10-CM | POA: Insufficient documentation

## 2013-05-10 DIAGNOSIS — G3180 Leukodystrophy, unspecified: Secondary | ICD-10-CM | POA: Insufficient documentation

## 2013-05-10 DIAGNOSIS — M542 Cervicalgia: Secondary | ICD-10-CM | POA: Insufficient documentation

## 2013-05-10 DIAGNOSIS — G4700 Insomnia, unspecified: Secondary | ICD-10-CM | POA: Insufficient documentation

## 2013-05-10 DIAGNOSIS — I70219 Atherosclerosis of native arteries of extremities with intermittent claudication, unspecified extremity: Secondary | ICD-10-CM | POA: Insufficient documentation

## 2013-05-10 DIAGNOSIS — R5381 Other malaise: Secondary | ICD-10-CM | POA: Insufficient documentation

## 2013-05-10 MED ORDER — MORPHINE SULFATE ER 15 MG PO TBCR: 15.0000 mg | EXTENDED_RELEASE_TABLET | Freq: Every day | ORAL | Status: DC

## 2013-05-10 MED ORDER — OXYCODONE-ACETAMINOPHEN 10-325 MG PO TABS: 1.0000 | ORAL_TABLET | Freq: Two times a day (BID) | ORAL | Status: DC | PRN
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
## 2013-05-10 NOTE — Patient Instructions (Signed)
Try to stay as active as tolerated 

## 2013-05-14 ENCOUNTER — Encounter (INDEPENDENT_AMBULATORY_CARE_PROVIDER_SITE_OTHER): Payer: Medicare PPO

## 2013-05-14 ENCOUNTER — Ambulatory Visit (INDEPENDENT_AMBULATORY_CARE_PROVIDER_SITE_OTHER): Payer: Medicare PPO | Admitting: Diagnostic Neuroimaging

## 2013-05-14 DIAGNOSIS — R5381 Other malaise: Secondary | ICD-10-CM

## 2013-05-14 DIAGNOSIS — H02409 Unspecified ptosis of unspecified eyelid: Secondary | ICD-10-CM

## 2013-05-14 DIAGNOSIS — R5383 Other fatigue: Secondary | ICD-10-CM

## 2013-05-14 DIAGNOSIS — R1319 Other dysphagia: Secondary | ICD-10-CM

## 2013-05-14 DIAGNOSIS — Z0289 Encounter for other administrative examinations: Secondary | ICD-10-CM

## 2013-05-14 NOTE — Progress Notes (Signed)
Orders only encounter

## 2013-05-16 ENCOUNTER — Ambulatory Visit: Payer: Medicare PPO | Admitting: Licensed Clinical Social Worker

## 2013-05-17 NOTE — Procedures (Signed)
   [facility]  NCS (NERVE CONDUCTION STUDY) WITH EMG (ELECTROMYOGRAPHY) REPORT   STUDY DATE: 05/14/13 PATIENT NAME: Joann Ray DOB: [birth year] MRN: [id number]  ORDERING CLINICIAN: Joycelyn Schmid, MD   TECHNOLOGIST: Gearldine Shown  ELECTROMYOGRAPHER: Glenford Bayley. Freddie Dymek, MD  CLINICAL INFORMATION: 67 year old female with intermittent ptosis and weakness.  FINDINGS: NERVE CONDUCTION STUDY: Left median, left ulnar, bilateral peroneal and bilateral tibial motor responses have normal distal latencies, amplitudes, conduction velocities and F-wave latencies. Right sided H reflex responses decreased of the tibial nerves and recording of the psoas muscles normal. Left median, left ulnar, bilateral superficial peroneal sensory responses are normal.  Low-frequency repetitive stimulation (3 Hz) at baseline, 30 seconds, 1 minute, 2 minutes, 3 minutes and 4 minutes post exertion show no decremental or incremental response. High-frequency repetitive stimulation at 40 Hz demonstrates up to 5% incremental response. Repetitive situation studies were performed on the left median nerve.  NEEDLE ELECTROMYOGRAPHY: Needle examination of selected muscles of left upper extremity (deltoid, biceps, triceps, flexor carpi radialis, first dorsal interosseous) shows no abnormal spontaneous activity at rest and normal motor unit recruitment on exertion.  IMPRESSION:  Mildly abnormal study demonstrating: 1. High-frequency repetitive nursing lesion demonstrates up to 5% incremental response of the left median nerve. No incremental or decremental motor response noted on low-frequency stimulation.  2. No evidence of underlying neuropathy or myopathy at this time.   INTERPRETING PHYSICIAN:  Suanne Marker, MD Certified in Neurology, Neurophysiology and Neuroimaging  [facility] [address], [address] [place], [place] [address] [phone]

## 2013-05-18 ENCOUNTER — Telehealth: Payer: Self-pay

## 2013-05-18 ENCOUNTER — Encounter (INDEPENDENT_AMBULATORY_CARE_PROVIDER_SITE_OTHER): Payer: Medicare PPO

## 2013-05-18 DIAGNOSIS — G458 Other transient cerebral ischemic attacks and related syndromes: Secondary | ICD-10-CM

## 2013-05-18 DIAGNOSIS — M79609 Pain in unspecified limb: Secondary | ICD-10-CM

## 2013-05-18 DIAGNOSIS — I771 Stricture of artery: Secondary | ICD-10-CM

## 2013-05-18 DIAGNOSIS — I739 Peripheral vascular disease, unspecified: Secondary | ICD-10-CM

## 2013-05-18 NOTE — Telephone Encounter (Signed)
Spoke with patient she did admit to taking her husbands hydrocodone.  She did not have a reason why percocet and morphine were negative in urine.

## 2013-05-18 NOTE — Telephone Encounter (Signed)
Message copied by Judd Gaudier on Fri May 18, 2013  8:07 AM ------      Message from: Su Monks      Created: Wed May 16, 2013  3:37 PM       Did she sign a contract then we will d/c her, ask her whether she wants to go to a different clinic otherwise we will taper her down. Please let me know what the patient tells you about this inconsistent UDS ------

## 2013-05-18 NOTE — Telephone Encounter (Signed)
If she signed the contract we will d/c her, please ask whether she will go to a different pain clinic, if not we have to taper her down, will give instructions then

## 2013-05-22 ENCOUNTER — Other Ambulatory Visit: Payer: Self-pay | Admitting: *Deleted

## 2013-05-22 LAB — CK: Total CK: 118 U/L (ref 24–173)

## 2013-05-22 LAB — MUSK ANTIBODY TEST

## 2013-05-22 MED ORDER — ALBUTEROL SULFATE HFA 108 (90 BASE) MCG/ACT IN AERS: 2.0000 | INHALATION_SPRAY | Freq: Two times a day (BID) | RESPIRATORY_TRACT | Status: DC

## 2013-05-23 NOTE — Telephone Encounter (Signed)
Note put in appointment to UDS next visit.

## 2013-05-23 NOTE — Telephone Encounter (Signed)
Ok  Unfortunately she signed the contract on the same day, we will have to do another UDS at her next visit, if she has another inconsistent one we will d/c her

## 2013-05-23 NOTE — Telephone Encounter (Signed)
Controlled substance agreement signed 04/2013

## 2013-05-29 ENCOUNTER — Encounter: Payer: Self-pay | Admitting: *Deleted

## 2013-05-30 ENCOUNTER — Encounter: Payer: Self-pay | Admitting: Cardiovascular Disease

## 2013-05-30 ENCOUNTER — Ambulatory Visit (INDEPENDENT_AMBULATORY_CARE_PROVIDER_SITE_OTHER): Payer: Medicare PPO | Admitting: Cardiovascular Disease

## 2013-05-30 VITALS — BP 131/68 | HR 68 | Ht 62.0 in | Wt 120.0 lb

## 2013-05-30 DIAGNOSIS — I739 Peripheral vascular disease, unspecified: Secondary | ICD-10-CM

## 2013-05-30 DIAGNOSIS — I10 Essential (primary) hypertension: Secondary | ICD-10-CM

## 2013-05-30 NOTE — Progress Notes (Signed)
HPI:  67 year-old woman presenting for followup evaluation. The patient has peripheral arterial disease. She has symptomatic left subclavian stenosis and underwent left subclavian stenting in 2013. She recently had a upper extremity arterial duplex evaluation demonstrating normal bilateral waveforms. She also had ABIs performed which were mildly decreased on the left and 0.81. A duplex ultrasound demonstrated greater than 50% stenosis in the left common femoral artery by velocity criteria but visually this appeared less severe. There were no other significant stenoses identified. She presents today for followup evaluation.  The patient has multiple symptoms of arm and leg weakness and discomfort. She's had some issues with eyelid drooping and headaches as well. She's been evaluated by neurology and there has been a question of myasthenia. She was started on. Her stating that it did not have much clinical response if she has discontinued this. She continues to undergo testing.  She complains of discomfort in her left thigh and calf with standing or activity. Occasionally she has similar symptoms on the right. She denies chest pain or pressure. She's compliant with medications. She continues to smoke on an irregular basis.  Outpatient Encounter Prescriptions as of 05/30/2013  Medication Sig Dispense Refill  . albuterol (PROAIR HFA) 108 (90 BASE) MCG/ACT inhaler Inhale 2 puffs into the lungs 2 (two) times daily.  1 Inhaler  5  . ALPRAZolam (XANAX) 0.25 MG tablet Take 1 tablet (0.25 mg total) by mouth 2 (two) times daily as needed for sleep or anxiety.  60 tablet  2  . clonazePAM (KLONOPIN) 1 MG tablet Take 1 tablet (1 mg total) by mouth at bedtime as needed. To help rest  30 tablet  5  . cloNIDine (CATAPRES) 0.1 MG tablet Take 0.5 tablets (0.05 mg total) by mouth 2 (two) times daily.  90 tablet  1  . clopidogrel (PLAVIX) 75 MG tablet Take 1 tablet (75 mg total) by mouth daily.  30 tablet  2  .  dicyclomine (BENTYL) 10 MG capsule Take 1 capsule (10 mg total) by mouth 3 (three) times daily as needed. For stomach cramps  90 capsule  3  . escitalopram (LEXAPRO) 10 MG tablet Take 1/2 tablet in the morning for 1 week, then 1 tablet in the AM qd  90 tablet  1  . hydrochlorothiazide (HYDRODIURIL) 25 MG tablet Take 1 tablet (25 mg total) by mouth daily.  90 tablet  1  . levothyroxine (SYNTHROID) 125 MCG tablet Take 1 tablet (125 mcg total) by mouth daily.  90 tablet  1  . losartan (COZAAR) 25 MG tablet Take 1 tablet (25 mg total) by mouth daily.  90 tablet  1  . morphine (MS CONTIN) 15 MG 12 hr tablet Take 1 tablet (15 mg total) by mouth daily.  30 tablet  0  . ondansetron (ZOFRAN) 4 MG tablet Take 1 tablet (4 mg total) by mouth every 6 (six) hours as needed. For nausea/vomiting  30 tablet  2  . oxyCODONE-acetaminophen (PERCOCET) 10-325 MG per tablet Take 1 tablet by mouth every 12 (twelve) hours as needed. For pain  60 tablet  0  . pantoprazole (PROTONIX) 40 MG tablet Take 1 tablet (40 mg total) by mouth daily.  30 tablet  5  . pregabalin (LYRICA) 50 MG capsule Take 1 capsule (50 mg total) by mouth at bedtime.  90 capsule  1  . pyridostigmine (MESTINON) 60 MG tablet Take 0.5 tablets (30 mg total) by mouth 3 (three) times daily.  90 tablet  12  No facility-administered encounter medications on file as of 05/30/2013.    Allergies  Allergen Reactions  . Penicillins Anaphylaxis and Hives    Past Medical History  Diagnosis Date  . Anxiety   . Asthma   . Depression   . Hypertension   . Hypothyroidism   . COPD (chronic obstructive pulmonary disease)   . Fibromyalgia   . Hyperlipidemia   . Arrhythmia      AVNRT  . Anemia   . Migraine     ROS: Negative except as per HPI  BP 131/68  Pulse 68  Ht 5\' 2"  (1.575 m)  Wt 54.432 kg (120 lb)  BMI 21.94 kg/m2  PHYSICAL EXAM: Pt is alert and oriented, NAD HEENT: normal Neck: JVP - normal, carotids 2+= with a left carotid bruit Lungs: CTA  bilaterally CV: RRR with a soft systolic ejection murmur at the right upper sternal border Abd: soft, NT, Positive BS, no hepatomegaly Ext: no C/C/E, distal pulses intact and equal Skin: warm/dry no rash  EKG:  Normal sinus rhythm with nonspecific ST and T wave changes  ASSESSMENT AND PLAN: 1. Left subclavian stenosis status post stenting. Her noninvasive study was reviewed and her exam suggests patency of her stent site. Tobacco cessation was reviewed. She will try nicotine patch. She'll followup in one year. I think she should remain on Plavix in the setting of her aspirin intolerance.  2. Lower extremity peripheral arterial disease. I reviewed her noninvasive studies. I do not think her leg symptoms are related to peripheral arterial disease. Continue risk reduction measures. She's not a good candidate for a statin drug with her fairly severe musculoskeletal problems with complaints of weakness. I will see her back in one year.  [name] 05/30/2013 11:16 AM   2.

## 2013-05-30 NOTE — Patient Instructions (Addendum)
Your physician wants you to follow-up in: 1 YEAR with Dr Cooper.  You will receive a reminder letter in the mail two months in advance. If you don't receive a letter, please call our office to schedule the follow-up appointment.  Your physician has requested that you have an ankle brachial index (ABI) in 1 YEAR. During this test an ultrasound and blood pressure cuff are used to evaluate the arteries that supply the arms and legs with blood. Allow thirty minutes for this exam. There are no restrictions or special instructions.  Your physician recommends that you continue on your current medications as directed. Please refer to the Current Medication list given to you today.  

## 2013-06-08 ENCOUNTER — Encounter: Payer: Medicare PPO | Attending: Physical Medicine and Rehabilitation | Admitting: Physical Medicine and Rehabilitation

## 2013-06-08 ENCOUNTER — Encounter: Payer: Self-pay | Admitting: Physical Medicine and Rehabilitation

## 2013-06-08 VITALS — BP 134/64 | HR 65 | Resp 14 | Ht 62.0 in | Wt 121.0 lb

## 2013-06-08 DIAGNOSIS — E7529 Other sphingolipidosis: Secondary | ICD-10-CM | POA: Insufficient documentation

## 2013-06-08 DIAGNOSIS — M545 Low back pain, unspecified: Secondary | ICD-10-CM | POA: Insufficient documentation

## 2013-06-08 DIAGNOSIS — I708 Atherosclerosis of other arteries: Secondary | ICD-10-CM | POA: Insufficient documentation

## 2013-06-08 DIAGNOSIS — R252 Cramp and spasm: Secondary | ICD-10-CM | POA: Insufficient documentation

## 2013-06-08 DIAGNOSIS — Z5181 Encounter for therapeutic drug level monitoring: Secondary | ICD-10-CM

## 2013-06-08 DIAGNOSIS — G3180 Leukodystrophy, unspecified: Secondary | ICD-10-CM | POA: Insufficient documentation

## 2013-06-08 DIAGNOSIS — J449 Chronic obstructive pulmonary disease, unspecified: Secondary | ICD-10-CM | POA: Insufficient documentation

## 2013-06-08 DIAGNOSIS — I739 Peripheral vascular disease, unspecified: Secondary | ICD-10-CM | POA: Insufficient documentation

## 2013-06-08 DIAGNOSIS — Z79899 Other long term (current) drug therapy: Secondary | ICD-10-CM

## 2013-06-08 DIAGNOSIS — R5381 Other malaise: Secondary | ICD-10-CM | POA: Insufficient documentation

## 2013-06-08 DIAGNOSIS — M542 Cervicalgia: Secondary | ICD-10-CM | POA: Insufficient documentation

## 2013-06-08 DIAGNOSIS — G8929 Other chronic pain: Secondary | ICD-10-CM | POA: Insufficient documentation

## 2013-06-08 DIAGNOSIS — J4489 Other specified chronic obstructive pulmonary disease: Secondary | ICD-10-CM | POA: Insufficient documentation

## 2013-06-08 DIAGNOSIS — G4700 Insomnia, unspecified: Secondary | ICD-10-CM | POA: Insufficient documentation

## 2013-06-08 DIAGNOSIS — I10 Essential (primary) hypertension: Secondary | ICD-10-CM | POA: Insufficient documentation

## 2013-06-08 DIAGNOSIS — M47817 Spondylosis without myelopathy or radiculopathy, lumbosacral region: Secondary | ICD-10-CM

## 2013-06-08 DIAGNOSIS — IMO0001 Reserved for inherently not codable concepts without codable children: Secondary | ICD-10-CM

## 2013-06-08 DIAGNOSIS — F17200 Nicotine dependence, unspecified, uncomplicated: Secondary | ICD-10-CM | POA: Insufficient documentation

## 2013-06-08 DIAGNOSIS — F411 Generalized anxiety disorder: Secondary | ICD-10-CM | POA: Insufficient documentation

## 2013-06-08 DIAGNOSIS — E039 Hypothyroidism, unspecified: Secondary | ICD-10-CM | POA: Insufficient documentation

## 2013-06-08 DIAGNOSIS — E785 Hyperlipidemia, unspecified: Secondary | ICD-10-CM | POA: Insufficient documentation

## 2013-06-08 MED ORDER — MORPHINE SULFATE ER 15 MG PO TBCR: 15.0000 mg | EXTENDED_RELEASE_TABLET | Freq: Every day | ORAL | Status: DC

## 2013-06-08 MED ORDER — OXYCODONE-ACETAMINOPHEN 10-325 MG PO TABS: 1.0000 | ORAL_TABLET | Freq: Two times a day (BID) | ORAL | Status: DC | PRN

## 2013-06-08 NOTE — Patient Instructions (Signed)
Try to stay as active as tolerated 
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
## 2013-06-13 ENCOUNTER — Telehealth: Payer: Self-pay | Admitting: Diagnostic Neuroimaging

## 2013-06-14 NOTE — Telephone Encounter (Signed)
Called patient to relay normal lab results, no answer left voice message asking her to call me back. Once she gets this message

## 2013-06-18 ENCOUNTER — Ambulatory Visit: Payer: Medicare PPO | Admitting: Internal Medicine

## 2013-06-19 ENCOUNTER — Telehealth: Payer: Self-pay | Admitting: Diagnostic Neuroimaging

## 2013-06-20 ENCOUNTER — Telehealth: Payer: Self-pay | Admitting: Diagnostic Neuroimaging

## 2013-06-20 ENCOUNTER — Telehealth: Payer: Self-pay

## 2013-06-20 NOTE — Telephone Encounter (Signed)
Patient requested lab results mailed to her. Mailed. Still having muscle weakness. Would like to f/u in office.

## 2013-06-20 NOTE — Telephone Encounter (Signed)
Message copied by Judd Gaudier on Wed Jun 20, 2013 10:02 AM ------      Message from: Su Monks      Created: Tue Jun 19, 2013  2:39 PM       Please inform patient that we will not prescribe narcotics to her anymore, this is the second , or 3rd UDS which did not show her narcotics, and /or was inconsistent otherwise.      Please ask the patient whether she is taking her medications now, then we have to taper her down :            Morphine 15mg  , 1 tablet a day, d/c, Oxycodone 10mg , bid for one week, then decrease to 1 tablet a day for one week, then dc      If she wants to go somewhere else, please give her info ------

## 2013-06-20 NOTE — Telephone Encounter (Signed)
Spoke with patient.  Informed patient that we will not prescribe narcotics to her anymore, this is the second , or 3rd UDS which did not show her narcotics, and /or was inconsistent otherwise. Directions given to her to taper medication Morphine 15mg  , 1 tablet a day, d/c, Oxycodone 10mg , bid for one week, then decrease to 1 tablet a day for one week, then dc She will keep appointment.

## 2013-07-06 NOTE — Telephone Encounter (Signed)
Follow up in next 2-4 weeks with me. -VRP

## 2013-07-09 ENCOUNTER — Encounter: Payer: Medicare PPO | Admitting: Physical Medicine and Rehabilitation

## 2013-07-09 NOTE — Telephone Encounter (Signed)
Called patient to schedule f/u. No answer. Will try later

## 2013-07-09 NOTE — Telephone Encounter (Signed)
I called pt and made appt for her 08-02-13 at 1300 with Dr. Marjory Lies.

## 2013-07-10 ENCOUNTER — Encounter: Payer: Self-pay | Admitting: Internal Medicine

## 2013-07-10 ENCOUNTER — Ambulatory Visit (INDEPENDENT_AMBULATORY_CARE_PROVIDER_SITE_OTHER): Payer: Medicare PPO | Admitting: Internal Medicine

## 2013-07-10 VITALS — BP 144/80 | HR 72 | Temp 98.2°F | Wt 120.0 lb

## 2013-07-10 DIAGNOSIS — IMO0001 Reserved for inherently not codable concepts without codable children: Secondary | ICD-10-CM

## 2013-07-10 DIAGNOSIS — H02409 Unspecified ptosis of unspecified eyelid: Secondary | ICD-10-CM

## 2013-07-10 DIAGNOSIS — J449 Chronic obstructive pulmonary disease, unspecified: Secondary | ICD-10-CM

## 2013-07-10 DIAGNOSIS — F341 Dysthymic disorder: Secondary | ICD-10-CM

## 2013-07-10 DIAGNOSIS — J4489 Other specified chronic obstructive pulmonary disease: Secondary | ICD-10-CM

## 2013-07-10 MED ORDER — CLONAZEPAM 1 MG PO TABS: 1.0000 mg | ORAL_TABLET | Freq: Every evening | ORAL | Status: DC | PRN

## 2013-07-10 MED ORDER — DOXYCYCLINE HYCLATE 100 MG PO TABS: 100.0000 mg | ORAL_TABLET | Freq: Two times a day (BID) | ORAL | Status: DC

## 2013-07-10 MED ORDER — ESCITALOPRAM OXALATE 10 MG PO TABS: 10.0000 mg | ORAL_TABLET | Freq: Every day | ORAL | Status: DC

## 2013-07-10 MED ORDER — OXYCODONE-ACETAMINOPHEN 10-325 MG PO TABS: 1.0000 | ORAL_TABLET | Freq: Two times a day (BID) | ORAL | Status: DC | PRN

## 2013-07-10 MED ORDER — DESVENLAFAXINE SUCCINATE ER 50 MG PO TB24: 50.0000 mg | ORAL_TABLET | Freq: Every day | ORAL | Status: DC

## 2013-07-10 MED ORDER — MORPHINE SULFATE ER 15 MG PO TBCR: 15.0000 mg | EXTENDED_RELEASE_TABLET | Freq: Every day | ORAL | Status: DC

## 2013-07-10 MED ORDER — BUDESONIDE-FORMOTEROL FUMARATE 160-4.5 MCG/ACT IN AERO: 2.0000 | INHALATION_SPRAY | Freq: Two times a day (BID) | RESPIRATORY_TRACT | Status: DC

## 2013-07-10 NOTE — Progress Notes (Signed)
Subjective:    Patient ID: Joann Ray, female    DOB: August 18, 1946, 67 y.o.   MRN: [id number]  HPI  67 year old white female with chronic fibromyalgia, anxiety/depression and hypothyroidism for followup. Patient referred to neurologist for intermittent left eyelid droop. Workup for myasthenia gravis or other myopathy negative.  EMG completed  Unfortunately patient has had worsening symptoms of fibromyalgia. She is unhappy with nurse practitioner for pain management.  Patient also experiencing interpersonal relationship issues with her daughter. Patient reports worsening depressive/anxiety symptoms.  COPD-patient has been using rescue inhaler more often. She reports wheezing more than usual  Review of Systems Negative for chest pain. Home BP readings higher than usual  Past Medical History  Diagnosis Date  . Anxiety   . Asthma   . Depression   . Hypertension   . Hypothyroidism   . COPD (chronic obstructive pulmonary disease)   . Fibromyalgia   . Hyperlipidemia   . Arrhythmia      AVNRT  . Anemia   . Migraine     History   Social History  . Marital Status: Married    Spouse Name: Sammy    Number of Children: 3  . Years of Education: 12th/[employer]    Occupational History  . DISABLED    Social History Main Topics  . Smoking status: Current Some Day Smoker -- 0.50 packs/day for 40 years    Types: Cigarettes    Last Attempt to Quit: 11/29/2009  . Smokeless tobacco: Never Used     Comment: September 2011, again Oct 2013  . Alcohol Use: No  . Drug Use: No  . Sexually Active: Not on file   Other Topics Concern  . Not on file   Social History Narrative   She has 36 year old granddaughter-Maggie   Diagnosed with juvenile arthritis   Pt lives at home with spouse.    Caffeine Use:2-3 cups daily.    Past Surgical History  Procedure Laterality Date  . Appendectomy    . Abdominal hysterectomy    . Nasal sinus surgery  1993  . Cardiac electrophysiology mapping  and ablation  08/28/2010    EPS/RFA of AVNRT  - Dr. Sharrell Ku  . Coronary stent placement  09/2012    Family History  Problem Relation Age of Onset  . Colon cancer Sister 15  . Diabetes Mother   . Stroke Mother   . Dementia Mother   . Skin cancer Father   . Breast cancer Paternal Aunt   . Stomach cancer Paternal Aunt   . Stroke Sister     Allergies  Allergen Reactions  . Penicillins Anaphylaxis and Hives    Current Outpatient Prescriptions on File Prior to Visit  Medication Sig Dispense Refill  . albuterol (PROAIR HFA) 108 (90 BASE) MCG/ACT inhaler Inhale 2 puffs into the lungs 2 (two) times daily.  1 Inhaler  5  . ALPRAZolam (XANAX) 0.25 MG tablet Take 1 tablet (0.25 mg total) by mouth 2 (two) times daily as needed for sleep or anxiety.  60 tablet  2  . cloNIDine (CATAPRES) 0.1 MG tablet Take 0.5 tablets (0.05 mg total) by mouth 2 (two) times daily.  90 tablet  1  . clopidogrel (PLAVIX) 75 MG tablet Take 1 tablet (75 mg total) by mouth daily.  30 tablet  2  . dicyclomine (BENTYL) 10 MG capsule Take 1 capsule (10 mg total) by mouth 3 (three) times daily as needed. For stomach cramps  90 capsule  3  . hydrochlorothiazide (  HYDRODIURIL) 25 MG tablet Take 1 tablet (25 mg total) by mouth daily.  90 tablet  1  . levothyroxine (SYNTHROID) 125 MCG tablet Take 1 tablet (125 mcg total) by mouth daily.  90 tablet  1  . losartan (COZAAR) 25 MG tablet Take 1 tablet (25 mg total) by mouth daily.  90 tablet  1  . ondansetron (ZOFRAN) 4 MG tablet Take 1 tablet (4 mg total) by mouth every 6 (six) hours as needed. For nausea/vomiting  30 tablet  2  . pantoprazole (PROTONIX) 40 MG tablet Take 1 tablet (40 mg total) by mouth daily.  30 tablet  5  . pregabalin (LYRICA) 50 MG capsule Take 1 capsule (50 mg total) by mouth at bedtime.  90 capsule  1   No current facility-administered medications on file prior to visit.    BP 144/80  Pulse 72  Temp(Src) 98.2 F (36.8 C)  Wt 120 lb (54.432 kg)   BMI 21.94 kg/m2         Objective:   Physical Exam  Constitutional: She is oriented to person, place, and time. She appears well-developed and well-nourished.  HENT:  Head: Normocephalic and atraumatic.  Cardiovascular: Normal rate, regular rhythm and normal heart sounds.   No murmur heard. Pulmonary/Chest: Effort normal and breath sounds normal. She has no wheezes.  Abdominal: Bowel sounds are normal. There is no tenderness.  Neurological: She is alert and oriented to person, place, and time. No cranial nerve deficit.  Skin: Skin is warm and dry.  Psychiatric:  Tearful, anxious          Assessment & Plan:

## 2013-07-10 NOTE — Assessment & Plan Note (Addendum)
Exacerbated by interpersonal relationships with family members.  Continue Lexapro.   Add Pristiq.

## 2013-07-10 NOTE — Assessment & Plan Note (Signed)
Patient experiencing productive cough and wheezing for last 4 weeks. Treat with doxycycline. Samples of Symbicort provided.  Reassess in 3 weeks.  Patient advised to call office if symptoms persist or worsen.

## 2013-07-10 NOTE — Assessment & Plan Note (Signed)
Patient experiencing exacerbation of fibromyalgia pain. Unfortunately, she did not have success with referral to pain management specialist. Resume management of pain medications. Add Pristiq.  She did not tolerate Cymbalta in the past. Continue same dose of Lyrica.

## 2013-07-10 NOTE — Assessment & Plan Note (Signed)
Patient evaluated by neurology. Workup for myasthenia gravis negative. EMG negative for myopathy.

## 2013-07-26 ENCOUNTER — Ambulatory Visit: Payer: Medicare PPO | Admitting: Internal Medicine

## 2013-07-31 ENCOUNTER — Ambulatory Visit: Payer: Medicare PPO | Admitting: Internal Medicine

## 2013-08-02 ENCOUNTER — Ambulatory Visit: Payer: Self-pay | Admitting: Diagnostic Neuroimaging

## 2013-08-07 ENCOUNTER — Ambulatory Visit (INDEPENDENT_AMBULATORY_CARE_PROVIDER_SITE_OTHER): Payer: Medicare PPO | Admitting: Internal Medicine

## 2013-08-07 ENCOUNTER — Encounter: Payer: Self-pay | Admitting: Internal Medicine

## 2013-08-07 VITALS — BP 152/92 | HR 76 | Temp 98.2°F | Wt 117.0 lb

## 2013-08-07 DIAGNOSIS — Z0000 Encounter for general adult medical examination without abnormal findings: Secondary | ICD-10-CM

## 2013-08-07 DIAGNOSIS — I771 Stricture of artery: Secondary | ICD-10-CM

## 2013-08-07 DIAGNOSIS — I10 Essential (primary) hypertension: Secondary | ICD-10-CM

## 2013-08-07 DIAGNOSIS — I739 Peripheral vascular disease, unspecified: Secondary | ICD-10-CM

## 2013-08-07 DIAGNOSIS — IMO0001 Reserved for inherently not codable concepts without codable children: Secondary | ICD-10-CM

## 2013-08-07 DIAGNOSIS — F341 Dysthymic disorder: Secondary | ICD-10-CM

## 2013-08-07 DIAGNOSIS — N644 Mastodynia: Secondary | ICD-10-CM

## 2013-08-07 MED ORDER — MORPHINE SULFATE ER 15 MG PO TBCR: 15.0000 mg | EXTENDED_RELEASE_TABLET | Freq: Every day | ORAL | Status: DC

## 2013-08-07 MED ORDER — CLOPIDOGREL BISULFATE 75 MG PO TABS: 75.0000 mg | ORAL_TABLET | Freq: Every day | ORAL | Status: DC

## 2013-08-07 MED ORDER — OXYCODONE-ACETAMINOPHEN 10-325 MG PO TABS: 1.0000 | ORAL_TABLET | Freq: Two times a day (BID) | ORAL | Status: DC | PRN

## 2013-08-07 MED ORDER — DESVENLAFAXINE SUCCINATE ER 50 MG PO TB24: 50.0000 mg | ORAL_TABLET | Freq: Every day | ORAL | Status: DC

## 2013-08-07 MED ORDER — LOSARTAN POTASSIUM 50 MG PO TABS: 50.0000 mg | ORAL_TABLET | Freq: Every day | ORAL | Status: DC

## 2013-08-07 MED ORDER — LEVOTHYROXINE SODIUM 125 MCG PO TABS: 125.0000 ug | ORAL_TABLET | Freq: Every day | ORAL | Status: DC

## 2013-08-07 MED ORDER — PANTOPRAZOLE SODIUM 40 MG PO TBEC: 40.0000 mg | DELAYED_RELEASE_TABLET | Freq: Every day | ORAL | Status: DC

## 2013-08-07 MED ORDER — HYDROCHLOROTHIAZIDE 25 MG PO TABS: 25.0000 mg | ORAL_TABLET | Freq: Every day | ORAL | Status: DC

## 2013-08-07 NOTE — Progress Notes (Signed)
Subjective:    Patient ID: Joann Ray, female    DOB: 05-Feb-1946, 67 y.o.   MRN: [id number]  HPI  67 year old white female with history of chronic fibromyalgia, anxiety/depression and hypothyroidism for followup. Patient was started on Pristiq at last office visit. She reports her depressive symptoms have significantly improved. Patient also has been able to discontinue smoking. Her last cigarette 4 weeks ago.   Fibromyalgia pain has improved.  overall it is now rated as 6/10.  Patient complains of intermittent pain in her left lower lip. It has been on and off for at least one to 2 years. She now also has pain "behind" her left breast. It has been 2 or 3 years since her last mammogram.  Discomfort described as sharp.  It is worse with raising her arms above her shoulder and also with taking a deep breath.   Review of Systems Negative for dimpling of skin of left breast, negative for nipple discharge  Past Medical History  Diagnosis Date  . Anxiety   . Asthma   . Depression   . Hypertension   . Hypothyroidism   . COPD (chronic obstructive pulmonary disease)   . Fibromyalgia   . Hyperlipidemia   . Arrhythmia      AVNRT  . Anemia   . Migraine     History   Social History  . Marital Status: Married    Spouse Name: Sammy    Number of Children: 3  . Years of Education: 12th/[employer]    Occupational History  . DISABLED    Social History Main Topics  . Smoking status: Current Some Day Smoker -- 0.50 packs/day for 40 years    Types: Cigarettes    Last Attempt to Quit: 11/29/2009  . Smokeless tobacco: Never Used     Comment: September 2011, again Oct 2013  . Alcohol Use: No  . Drug Use: No  . Sexual Activity: Not on file   Other Topics Concern  . Not on file   Social History Narrative   She has 6 year old granddaughter-Maggie   Diagnosed with juvenile arthritis   Pt lives at home with spouse.    Caffeine Use:2-3 cups daily.    Past Surgical History   Procedure Laterality Date  . Appendectomy    . Abdominal hysterectomy    . Nasal sinus surgery  1993  . Cardiac electrophysiology mapping and ablation  08/28/2010    EPS/RFA of AVNRT  - Dr. Sharrell Ku  . Coronary stent placement  09/2012    Family History  Problem Relation Age of Onset  . Colon cancer Sister 39  . Diabetes Mother   . Stroke Mother   . Dementia Mother   . Skin cancer Father   . Breast cancer Paternal Aunt   . Stomach cancer Paternal Aunt   . Stroke Sister     Allergies  Allergen Reactions  . Penicillins Anaphylaxis and Hives    Current Outpatient Prescriptions on File Prior to Visit  Medication Sig Dispense Refill  . albuterol (PROAIR HFA) 108 (90 BASE) MCG/ACT inhaler Inhale 2 puffs into the lungs 2 (two) times daily.  1 Inhaler  5  . clonazePAM (KLONOPIN) 1 MG tablet Take 1 tablet (1 mg total) by mouth at bedtime as needed. To help rest  30 tablet  5  . cloNIDine (CATAPRES) 0.1 MG tablet Take 0.5 tablets (0.05 mg total) by mouth 2 (two) times daily.  90 tablet  1  . dicyclomine (BENTYL) 10 MG capsule  Take 1 capsule (10 mg total) by mouth 3 (three) times daily as needed. For stomach cramps  90 capsule  3  . escitalopram (LEXAPRO) 10 MG tablet Take 1 tablet (10 mg total) by mouth daily.  90 tablet  1  . ondansetron (ZOFRAN) 4 MG tablet Take 1 tablet (4 mg total) by mouth every 6 (six) hours as needed. For nausea/vomiting  30 tablet  2  . pregabalin (LYRICA) 50 MG capsule Take 1 capsule (50 mg total) by mouth at bedtime.  90 capsule  1   No current facility-administered medications on file prior to visit.    BP 152/92  Pulse 76  Temp(Src) 98.2 F (36.8 C) (Oral)  Wt 117 lb (53.071 kg)  BMI 21.39 kg/m2        Objective:   Physical Exam  Constitutional: She is oriented to person, place, and time. She appears well-developed and well-nourished.  HENT:  Head: Normocephalic and atraumatic.  Cardiovascular: Normal rate, regular rhythm and normal heart  sounds.   Pulmonary/Chest: Effort normal and breath sounds normal. She has no wheezes.  No dimpling of breast tissue, no left axillary adenopathy, tenderness of left breast 2 o'clock position, dense breast tissue  Abdominal: Soft. Bowel sounds are normal.  Musculoskeletal: She exhibits no edema.  Neurological: She is alert and oriented to person, place, and time. No cranial nerve deficit.  Skin: Skin is warm and dry.  Psychiatric: She has a normal mood and affect. Her behavior is normal.          Assessment & Plan:

## 2013-08-07 NOTE — Assessment & Plan Note (Signed)
Overall pain is improved. Continue same dose of pain medications for now. As we are able to further titrate Pristiq, work towards decreasing narcotic medication dose.

## 2013-08-07 NOTE — Assessment & Plan Note (Signed)
Patient experiencing pain in left axilla and left breast of unclear etiology. Obtain diagnostic mammogram. If unremarkable, we discussed obtaining CT of chest.

## 2013-08-07 NOTE — Assessment & Plan Note (Signed)
Depressive symptoms improved since adding Pristiq.  Continue same dose for now. It may further improve her fibromyalgia pain. Consider titrating to 100 mg. Her blood pressure is slightly higher compared to previous visit. This may be related to use of Pristiq.

## 2013-08-07 NOTE — Assessment & Plan Note (Signed)
Blood pressure higher since starting new antidepressant. Increase losartan to 50 mg. Patient advised to take hydrochlorothiazide 25 mg one half tablet daily. Obtain electrolytes and kidney function before next office visit. BP: 152/92 mmHg

## 2013-08-14 ENCOUNTER — Ambulatory Visit
Admission: RE | Admit: 2013-08-14 | Discharge: 2013-08-14 | Disposition: A | Discharge status: Home / Self Care | Payer: Medicare PPO | Source: Ambulatory Visit | Attending: Internal Medicine | Admitting: Internal Medicine

## 2013-08-14 DIAGNOSIS — N644 Mastodynia: Secondary | ICD-10-CM

## 2013-08-28 ENCOUNTER — Other Ambulatory Visit (INDEPENDENT_AMBULATORY_CARE_PROVIDER_SITE_OTHER): Payer: Medicare PPO

## 2013-08-28 DIAGNOSIS — E039 Hypothyroidism, unspecified: Secondary | ICD-10-CM

## 2013-08-28 DIAGNOSIS — I739 Peripheral vascular disease, unspecified: Secondary | ICD-10-CM

## 2013-08-28 LAB — BASIC METABOLIC PANEL
BUN: 12 mg/dL (ref 6–23)
GFR: 70.37 mL/min (ref >60.00)
Glucose, Bld: 88 mg/dL (ref 70–99)
Potassium: 4.7 mEq/L (ref 3.5–5.1)

## 2013-08-28 LAB — HEPATIC FUNCTION PANEL
ALT: 18 U/L (ref 0–35)
Bilirubin, Direct: 0 mg/dL (ref 0.0–0.3)
Total Bilirubin: 0.5 mg/dL (ref 0.3–1.2)

## 2013-08-28 LAB — LIPID PANEL
HDL: 65.8 mg/dL (ref >39.00)
Triglycerides: 108 mg/dL (ref 0.0–149.0)

## 2013-08-28 LAB — LDL CHOLESTEROL, DIRECT: Direct LDL: 210.3 mg/dL

## 2013-09-04 ENCOUNTER — Ambulatory Visit (INDEPENDENT_AMBULATORY_CARE_PROVIDER_SITE_OTHER): Payer: Medicare PPO | Admitting: Internal Medicine

## 2013-09-04 ENCOUNTER — Encounter: Payer: Self-pay | Admitting: Internal Medicine

## 2013-09-04 VITALS — BP 124/76 | HR 64 | Temp 98.1°F | Wt 124.0 lb

## 2013-09-04 DIAGNOSIS — N644 Mastodynia: Secondary | ICD-10-CM

## 2013-09-04 DIAGNOSIS — IMO0001 Reserved for inherently not codable concepts without codable children: Secondary | ICD-10-CM

## 2013-09-04 DIAGNOSIS — I10 Essential (primary) hypertension: Secondary | ICD-10-CM

## 2013-09-04 DIAGNOSIS — F341 Dysthymic disorder: Secondary | ICD-10-CM

## 2013-09-04 MED ORDER — OXYCODONE-ACETAMINOPHEN 5-325 MG PO TABS: 1.0000 | ORAL_TABLET | Freq: Two times a day (BID) | ORAL | Status: DC | PRN

## 2013-09-04 MED ORDER — PRAVASTATIN SODIUM 20 MG PO TABS: 20.0000 mg | ORAL_TABLET | Freq: Every day | ORAL | Status: DC

## 2013-09-04 MED ORDER — MORPHINE SULFATE ER 15 MG PO TBCR: 15.0000 mg | EXTENDED_RELEASE_TABLET | Freq: Every day | ORAL | Status: DC

## 2013-09-04 MED ORDER — TRAZODONE HCL 50 MG PO TABS: 50.0000 mg | ORAL_TABLET | Freq: Every evening | ORAL | Status: DC | PRN

## 2013-09-04 NOTE — Assessment & Plan Note (Signed)
BP control improved.  Cholesterol levels significantly elevated.  Start pravastatin 20 mg.  Hopefully statin medication will not exacerbate her fibromyalgia symptoms. BP: 124/76 mmHg  Lab Results  Component Value Date   NA 136 08/28/2013   K 4.7 08/28/2013   CL 99 08/28/2013   CO2 25 08/28/2013   Lab Results  Component Value Date   CREATININE 0.9 08/28/2013   Lab Results  Component Value Date   CHOL 300* 08/28/2013   HDL 65.80 08/28/2013   LDLDIRECT 210.3 08/28/2013   TRIG 108.0 08/28/2013   CHOLHDL 5 08/28/2013

## 2013-09-04 NOTE — Assessment & Plan Note (Signed)
Diagnostic mammogram negative.  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

## 2013-09-04 NOTE — Patient Instructions (Addendum)
Please complete the following lab tests before your next follow up appointment: FLP, LFTs - 272.4 

## 2013-09-04 NOTE — Assessment & Plan Note (Signed)
Continue pristiq and lyrica.  Add trazodone to improve sleep quality.  Decrease percocet dose.

## 2013-09-04 NOTE — Progress Notes (Signed)
Subjective:    Patient ID: Joann Ray, female    DOB: Sep 17, 1946, 67 y.o.   MRN: [id number]  HPI  67 year old white female with history of chronic fibromyalgia, anxiety/depression and intermittent left chest wall pain for followup. Patient underwent diagnostic mammogram. It was negative for suspicious lesions.  Patient reports her depressive symptoms significantly improved since starting Kristi.  Fibromyalgia pain has also improved. Patient has chronic insomnia. Despite taking 1 mg of clonazepam patient reports she only gets 2 hours of quality sleep.  Hypertension - improved with higher dose of losartan  Review of Systems Negative for chest pain or shortness of breath  Past Medical History  Diagnosis Date  . Anxiety   . Asthma   . Depression   . Hypertension   . Hypothyroidism   . COPD (chronic obstructive pulmonary disease)   . Fibromyalgia   . Hyperlipidemia   . Arrhythmia      AVNRT  . Anemia   . Migraine     History   Social History  . Marital Status: Married    Spouse Name: Sammy    Number of Children: 3  . Years of Education: 12th/[employer]    Occupational History  . DISABLED    Social History Main Topics  . Smoking status: Current Some Day Smoker -- 0.50 packs/day for 40 years    Types: Cigarettes    Last Attempt to Quit: 11/29/2009  . Smokeless tobacco: Never Used     Comment: September 2011, again Oct 2013  . Alcohol Use: No  . Drug Use: No  . Sexual Activity: Not on file   Other Topics Concern  . Not on file   Social History Narrative   She has 79 year old granddaughter-Maggie   Diagnosed with juvenile arthritis   Pt lives at home with spouse.    Caffeine Use:2-3 cups daily.    Past Surgical History  Procedure Laterality Date  . Appendectomy    . Abdominal hysterectomy    . Nasal sinus surgery  1993  . Cardiac electrophysiology mapping and ablation  08/28/2010    EPS/RFA of AVNRT  - Dr. Sharrell Ku  . Coronary stent placement   09/2012    Family History  Problem Relation Age of Onset  . Colon cancer Sister 35  . Diabetes Mother   . Stroke Mother   . Dementia Mother   . Skin cancer Father   . Breast cancer Paternal Aunt   . Stomach cancer Paternal Aunt   . Stroke Sister     Allergies  Allergen Reactions  . Penicillins Anaphylaxis and Hives    Current Outpatient Prescriptions on File Prior to Visit  Medication Sig Dispense Refill  . albuterol (PROAIR HFA) 108 (90 BASE) MCG/ACT inhaler Inhale 2 puffs into the lungs 2 (two) times daily.  1 Inhaler  5  . clonazePAM (KLONOPIN) 1 MG tablet Take 1 tablet (1 mg total) by mouth at bedtime as needed. To help rest  30 tablet  5  . cloNIDine (CATAPRES) 0.1 MG tablet Take 0.5 tablets (0.05 mg total) by mouth 2 (two) times daily.  90 tablet  1  . clopidogrel (PLAVIX) 75 MG tablet Take 1 tablet (75 mg total) by mouth daily.  90 tablet  1  . desvenlafaxine (PRISTIQ) 50 MG 24 hr tablet Take 1 tablet (50 mg total) by mouth daily.  90 tablet  1  . dicyclomine (BENTYL) 10 MG capsule Take 1 capsule (10 mg total) by mouth 3 (three) times daily as  needed. For stomach cramps  90 capsule  3  . escitalopram (LEXAPRO) 10 MG tablet Take 1 tablet (10 mg total) by mouth daily.  90 tablet  1  . hydrochlorothiazide (HYDRODIURIL) 25 MG tablet Take 0.5 tablets (12.5 mg total) by mouth daily.  90 tablet  1  . levothyroxine (SYNTHROID) 125 MCG tablet Take 1 tablet (125 mcg total) by mouth daily before breakfast.  90 tablet  1  . losartan (COZAAR) 50 MG tablet Take 1 tablet (50 mg total) by mouth daily.  90 tablet  1  . ondansetron (ZOFRAN) 4 MG tablet Take 1 tablet (4 mg total) by mouth every 6 (six) hours as needed. For nausea/vomiting  30 tablet  2  . pantoprazole (PROTONIX) 40 MG tablet Take 1 tablet (40 mg total) by mouth daily.  90 tablet  1  . pregabalin (LYRICA) 50 MG capsule Take 1 capsule (50 mg total) by mouth at bedtime.  90 capsule  1   No current facility-administered  medications on file prior to visit.    BP 124/76  Pulse 64  Temp(Src) 98.1 F (36.7 C) (Oral)  Wt 124 lb (56.246 kg)  BMI 22.67 kg/m2       Objective:   Physical Exam  Constitutional: She is oriented to person, place, and time. She appears well-developed and well-nourished.  HENT:  Head: Normocephalic and atraumatic.  Eyes: EOM are normal. Pupils are equal, round, and reactive to light.  Cardiovascular: Normal rate, regular rhythm and normal heart sounds.   No murmur heard. Pulmonary/Chest: Effort normal and breath sounds normal. She has no wheezes.  Musculoskeletal: She exhibits no edema.  Neurological: She is alert and oriented to person, place, and time. No cranial nerve deficit.  Skin: Skin is warm.  Psychiatric: She has a normal mood and affect. Her behavior is normal.          Assessment & Plan:

## 2013-09-04 NOTE — Assessment & Plan Note (Signed)
No change in medication regimen.  Continue lexapro and pristiq.

## 2013-09-20 ENCOUNTER — Other Ambulatory Visit: Payer: Self-pay | Admitting: Internal Medicine

## 2013-09-20 ENCOUNTER — Telehealth: Payer: Self-pay | Admitting: Internal Medicine

## 2013-09-20 MED ORDER — DESVENLAFAXINE SUCCINATE ER 50 MG PO TB24: 50.0000 mg | ORAL_TABLET | Freq: Every day | ORAL | Status: DC

## 2013-09-20 MED ORDER — ALPRAZOLAM 0.25 MG PO TABS: 0.2500 mg | ORAL_TABLET | Freq: Two times a day (BID) | ORAL | Status: DC | PRN

## 2013-09-20 NOTE — Telephone Encounter (Signed)
Pt should continue taking alprazolam.  Ok for RF x 3.  Also refill Pristiq.  Ok for 3 month supply with 1 RF.

## 2013-09-20 NOTE — Telephone Encounter (Signed)
Rx called in to pharmacy. 

## 2013-09-20 NOTE — Telephone Encounter (Signed)
Pt states she was previously taking alprazalam 0.25 mg and desvenlafaxine (PRISTIQ) 50 MG 24 hr tablet. However, PCP decided to d/c alprazolam and continue Pristiq only.  Pt states she is experiencing severe anxiety now that she is no longer taking the alprazolam and thinks she needs to resume taking it.  Pt is requesting a refill sent to [facility].

## 2013-09-20 NOTE — Telephone Encounter (Signed)
Pls advise.  

## 2013-09-27 ENCOUNTER — Other Ambulatory Visit: Payer: Self-pay | Admitting: *Deleted

## 2013-09-27 MED ORDER — OXYCODONE-ACETAMINOPHEN 7.5-325 MG PO TABS: 1.0000 | ORAL_TABLET | Freq: Two times a day (BID) | ORAL | Status: DC

## 2013-09-27 NOTE — Progress Notes (Signed)
Pt called and wanting to increase her dose of Percocet.  She has been taking the 5/325 and she is still in pain.  She has winged herself off the morpine.  Per Dr Artist Pais ok to increase to 7.5/325 with same directions

## 2013-10-02 ENCOUNTER — Other Ambulatory Visit (INDEPENDENT_AMBULATORY_CARE_PROVIDER_SITE_OTHER): Payer: Medicare PPO

## 2013-10-02 ENCOUNTER — Other Ambulatory Visit: Payer: Self-pay

## 2013-10-02 DIAGNOSIS — E785 Hyperlipidemia, unspecified: Secondary | ICD-10-CM

## 2013-10-02 LAB — HEPATIC FUNCTION PANEL
AST: 19 U/L (ref 0–37)
Albumin: 4.2 g/dL (ref 3.5–5.2)
Alkaline Phosphatase: 67 U/L (ref 39–117)
Bilirubin, Direct: 0 mg/dL (ref 0.0–0.3)

## 2013-10-02 LAB — LIPID PANEL
Cholesterol: 214 mg/dL — ABNORMAL HIGH (ref 0–200)
Total CHOL/HDL Ratio: 3
Triglycerides: 97 mg/dL (ref 0.0–149.0)
VLDL: 19.4 mg/dL (ref 0.0–40.0)

## 2013-10-02 NOTE — Telephone Encounter (Signed)
Rx request for Lyrica called in to pharmacy.  Ok per Dr. Artist Pais (signed medication refill request).

## 2013-10-09 ENCOUNTER — Ambulatory Visit: Payer: Medicare PPO | Admitting: Internal Medicine

## 2013-10-10 ENCOUNTER — Encounter: Payer: Self-pay | Admitting: Internal Medicine

## 2013-10-10 ENCOUNTER — Ambulatory Visit (INDEPENDENT_AMBULATORY_CARE_PROVIDER_SITE_OTHER): Payer: Medicare PPO | Admitting: Internal Medicine

## 2013-10-10 ENCOUNTER — Ambulatory Visit: Payer: Medicare PPO | Admitting: Internal Medicine

## 2013-10-10 VITALS — BP 114/76 | HR 64 | Temp 97.9°F | Ht 62.0 in | Wt 125.0 lb

## 2013-10-10 DIAGNOSIS — IMO0001 Reserved for inherently not codable concepts without codable children: Secondary | ICD-10-CM

## 2013-10-10 MED ORDER — ONDANSETRON HCL 4 MG PO TABS: 4.0000 mg | ORAL_TABLET | Freq: Four times a day (QID) | ORAL | Status: DC | PRN

## 2013-10-10 MED ORDER — OXYCODONE-ACETAMINOPHEN 7.5-325 MG PO TABS: 1.0000 | ORAL_TABLET | Freq: Three times a day (TID) | ORAL | Status: DC | PRN

## 2013-10-10 NOTE — Progress Notes (Signed)
Pre-visit discussion using our clinic review tool. No additional management support is needed unless otherwise documented below in the visit note.  

## 2013-10-11 NOTE — Progress Notes (Signed)
Subjective:    Patient ID: Joann Ray, female    DOB: Apr 05, 1946, 67 y.o.   MRN: [id number]  HPI  67 year old white female for follow up re: chronic fibromyalgia.  She has been able to taper off Morphine sulfate ER.  We tried tapering percocet to 5/325 but she reports worsening pain and withdrawal symptoms.  Her HR rises and she experiences tachycardia especially in the morning.  BP normalizes in the afternoon.  Review of Systems Negative for chest pain or shortness of breath    Past Medical History  Diagnosis Date  . Anxiety   . Asthma   . Depression   . Hypertension   . Hypothyroidism   . COPD (chronic obstructive pulmonary disease)   . Fibromyalgia   . Hyperlipidemia   . Arrhythmia      AVNRT  . Anemia   . Migraine     History   Social History  . Marital Status: Married    Spouse Name: Sammy    Number of Children: 3  . Years of Education: 12th/[employer]    Occupational History  . DISABLED    Social History Main Topics  . Smoking status: Current Some Day Smoker -- 0.50 packs/day for 40 years    Types: Cigarettes    Last Attempt to Quit: 11/29/2009  . Smokeless tobacco: Never Used     Comment: September 2011, again Oct 2013  . Alcohol Use: No  . Drug Use: No  . Sexual Activity: Not on file   Other Topics Concern  . Not on file   Social History Narrative   She has 32 year old granddaughter-Maggie   Diagnosed with juvenile arthritis   Pt lives at home with spouse.    Caffeine Use:2-3 cups daily.    Past Surgical History  Procedure Laterality Date  . Appendectomy    . Abdominal hysterectomy    . Nasal sinus surgery  1993  . Cardiac electrophysiology mapping and ablation  08/28/2010    EPS/RFA of AVNRT  - Dr. Sharrell Ku  . Coronary stent placement  09/2012    Family History  Problem Relation Age of Onset  . Colon cancer Sister 26  . Diabetes Mother   . Stroke Mother   . Dementia Mother   . Skin cancer Father   . Breast cancer Paternal Aunt    . Stomach cancer Paternal Aunt   . Stroke Sister     Allergies  Allergen Reactions  . Penicillins Anaphylaxis and Hives    Current Outpatient Prescriptions on File Prior to Visit  Medication Sig Dispense Refill  . albuterol (PROAIR HFA) 108 (90 BASE) MCG/ACT inhaler Inhale 2 puffs into the lungs 2 (two) times daily.  1 Inhaler  5  . ALPRAZolam (XANAX) 0.25 MG tablet Take 1 tablet (0.25 mg total) by mouth 2 (two) times daily as needed for sleep or anxiety.  60 tablet  3  . clonazePAM (KLONOPIN) 1 MG tablet Take 1 tablet (1 mg total) by mouth at bedtime as needed. To help rest  30 tablet  5  . cloNIDine (CATAPRES) 0.1 MG tablet Take 0.5 tablets (0.05 mg total) by mouth 2 (two) times daily.  90 tablet  1  . clopidogrel (PLAVIX) 75 MG tablet Take 1 tablet (75 mg total) by mouth daily.  90 tablet  1  . desvenlafaxine (PRISTIQ) 50 MG 24 hr tablet Take 1 tablet (50 mg total) by mouth daily.  90 tablet  1  . dicyclomine (BENTYL) 10 MG  capsule Take 1 capsule (10 mg total) by mouth 3 (three) times daily as needed. For stomach cramps  90 capsule  3  . escitalopram (LEXAPRO) 10 MG tablet Take 1 tablet (10 mg total) by mouth daily.  90 tablet  1  . hydrochlorothiazide (HYDRODIURIL) 25 MG tablet Take 0.5 tablets (12.5 mg total) by mouth daily.  90 tablet  1  . levothyroxine (SYNTHROID) 125 MCG tablet Take 1 tablet (125 mcg total) by mouth daily before breakfast.  90 tablet  1  . pantoprazole (PROTONIX) 40 MG tablet Take 1 tablet (40 mg total) by mouth daily.  90 tablet  1  . pravastatin (PRAVACHOL) 20 MG tablet Take 1 tablet (20 mg total) by mouth daily.  90 tablet  1  . pregabalin (LYRICA) 50 MG capsule Take 1 capsule (50 mg total) by mouth at bedtime.  90 capsule  1  . traZODone (DESYREL) 50 MG tablet Take 1 tablet (50 mg total) by mouth at bedtime as needed for sleep.  30 tablet  1   No current facility-administered medications on file prior to visit.    BP 114/76  Pulse 64  Temp(Src) 97.9 F  (36.6 C) (Oral)  Ht 5\' 2"  (1.575 m)  Wt 125 lb (56.7 kg)  BMI 22.86 kg/m2    Objective:   Physical Exam  Constitutional: She is oriented to person, place, and time. She appears well-developed and well-nourished.  Cardiovascular: Normal rate and regular rhythm.   No murmur heard. Pulmonary/Chest: Effort normal and breath sounds normal.  Neurological: She is alert and oriented to person, place, and time. No cranial nerve deficit.  Psychiatric: She has a normal mood and affect. Her behavior is normal.          Assessment & Plan:

## 2013-10-11 NOTE — Assessment & Plan Note (Signed)
Patient able to taper off morphine sulfate.  We tapered percocet too quickly.  She is having withdrawal symptoms.  We discussed slowly tapering over next 6 months.  Once off percocet, consider using naltrexone.

## 2013-10-19 ENCOUNTER — Telehealth: Payer: Self-pay | Admitting: *Deleted

## 2013-10-19 MED ORDER — METOPROLOL TARTRATE 25 MG PO TABS: 12.5000 mg | ORAL_TABLET | Freq: Two times a day (BID) | ORAL | Status: DC

## 2013-10-19 NOTE — Telephone Encounter (Signed)
rx called in, pt aware 

## 2013-10-19 NOTE — Telephone Encounter (Signed)
Pt called and stated that BP is better but her heart rate is up.  She is feeling fatigue.  Would like to know if Dr Joann Ray would give her a rx for tenormin.  She has taken it in the past and it has bee effective.  Please advise

## 2013-10-19 NOTE — Telephone Encounter (Signed)
I suggest she try metoprolol tartrate 25 mg 1/2 tab bid.  Keep monitoring BP and pulse.  # 30.  RF x 1

## 2013-10-19 NOTE — Telephone Encounter (Signed)
See prev note

## 2013-11-26 ENCOUNTER — Ambulatory Visit (INDEPENDENT_AMBULATORY_CARE_PROVIDER_SITE_OTHER): Payer: Medicare PPO | Admitting: Internal Medicine

## 2013-11-26 ENCOUNTER — Encounter: Payer: Self-pay | Admitting: Internal Medicine

## 2013-11-26 ENCOUNTER — Telehealth: Payer: Self-pay | Admitting: Internal Medicine

## 2013-11-26 VITALS — BP 118/72 | HR 82 | Temp 99.1°F | Ht 62.0 in | Wt 120.0 lb

## 2013-11-26 DIAGNOSIS — I10 Essential (primary) hypertension: Secondary | ICD-10-CM

## 2013-11-26 DIAGNOSIS — J441 Chronic obstructive pulmonary disease with (acute) exacerbation: Secondary | ICD-10-CM

## 2013-11-26 DIAGNOSIS — IMO0001 Reserved for inherently not codable concepts without codable children: Secondary | ICD-10-CM

## 2013-11-26 MED ORDER — BISOPROLOL FUMARATE 5 MG PO TABS: 2.5000 mg | ORAL_TABLET | Freq: Every day | ORAL | Status: DC

## 2013-11-26 MED ORDER — BUDESONIDE-FORMOTEROL FUMARATE 160-4.5 MCG/ACT IN AERO: 2.0000 | INHALATION_SPRAY | Freq: Two times a day (BID) | RESPIRATORY_TRACT | Status: DC

## 2013-11-26 MED ORDER — OXYCODONE-ACETAMINOPHEN 7.5-325 MG PO TABS: 1.0000 | ORAL_TABLET | Freq: Three times a day (TID) | ORAL | Status: DC | PRN

## 2013-11-26 MED ORDER — LEVOFLOXACIN 500 MG PO TABS: 500.0000 mg | ORAL_TABLET | Freq: Every day | ORAL | Status: DC

## 2013-11-26 MED ORDER — PREDNISONE 20 MG PO TABS: ORAL_TABLET | ORAL | Status: DC

## 2013-11-26 NOTE — Patient Instructions (Addendum)
Stop metoprolol - replace with bisoprolol Please contact our office if your symptoms do not improve or gets worse.

## 2013-11-26 NOTE — Progress Notes (Signed)
Pre visit review using our clinic review tool, if applicable. No additional management support is needed unless otherwise documented below in the visit note. 

## 2013-11-26 NOTE — Telephone Encounter (Signed)
Pt has poss bronchitis and pt has asthma. Refuses to see any one else. Would like to know if she could get worked in today if possible. Pt has cough./ congestion/  would like you to call her also.  Did not want to speak w/ triage, just cindy.

## 2013-11-26 NOTE — Progress Notes (Signed)
Subjective:    Patient ID: Joann Ray, female    DOB: Apr 30, 1946, 67 y.o.   MRN: [id number]  Cough    67 year old white female with history of COPD, hypertension, and chronic fibromyalgia complains of worsening productive cough over the last 5 days. Patient denies any fever or her cough has gradually worsened over the last 48 hours. She notes significant wheezing and dyspnea with exertion.  At previous visit, low-dose metoprolol was added to help control her blood pressure. She thinks metoprolol is exacerbating her wheezing.  Fibromyalgia-stable Review of Systems  Respiratory: Positive for cough.    Negative for fever or chills,  Yellowish sputum    Past Medical History  Diagnosis Date  . Anxiety   . Asthma   . Depression   . Hypertension   . Hypothyroidism   . COPD (chronic obstructive pulmonary disease)   . Fibromyalgia   . Hyperlipidemia   . Arrhythmia      AVNRT  . Anemia   . Migraine     History   Social History  . Marital Status: Married    Spouse Name: Sammy    Number of Children: 3  . Years of Education: 12th/[employer]    Occupational History  . DISABLED    Social History Main Topics  . Smoking status: Current Some Day Smoker -- 0.50 packs/day for 40 years    Types: Cigarettes    Last Attempt to Quit: 11/29/2009  . Smokeless tobacco: Never Used     Comment: September 2011, again Oct 2013  . Alcohol Use: No  . Drug Use: No  . Sexual Activity: Not on file   Other Topics Concern  . Not on file   Social History Narrative   She has 36 year old granddaughter-Maggie   Diagnosed with juvenile arthritis   Pt lives at home with spouse.    Caffeine Use:2-3 cups daily.    Past Surgical History  Procedure Laterality Date  . Appendectomy    . Abdominal hysterectomy    . Nasal sinus surgery  1993  . Cardiac electrophysiology mapping and ablation  08/28/2010    EPS/RFA of AVNRT  - Dr. Sharrell Ku  . Coronary stent placement  09/2012    Family  History  Problem Relation Age of Onset  . Colon cancer Sister 52  . Diabetes Mother   . Stroke Mother   . Dementia Mother   . Skin cancer Father   . Breast cancer Paternal Aunt   . Stomach cancer Paternal Aunt   . Stroke Sister     Allergies  Allergen Reactions  . Penicillins Anaphylaxis and Hives    Current Outpatient Prescriptions on File Prior to Visit  Medication Sig Dispense Refill  . albuterol (PROAIR HFA) 108 (90 BASE) MCG/ACT inhaler Inhale 2 puffs into the lungs 2 (two) times daily.  1 Inhaler  5  . ALPRAZolam (XANAX) 0.25 MG tablet Take 1 tablet (0.25 mg total) by mouth 2 (two) times daily as needed for sleep or anxiety.  60 tablet  3  . clonazePAM (KLONOPIN) 1 MG tablet Take 1 tablet (1 mg total) by mouth at bedtime as needed. To help rest  30 tablet  5  . cloNIDine (CATAPRES) 0.1 MG tablet Take 0.5 tablets (0.05 mg total) by mouth 2 (two) times daily.  90 tablet  1  . clopidogrel (PLAVIX) 75 MG tablet Take 1 tablet (75 mg total) by mouth daily.  90 tablet  1  . desvenlafaxine (PRISTIQ) 50 MG 24  hr tablet Take 1 tablet (50 mg total) by mouth daily.  90 tablet  1  . dicyclomine (BENTYL) 10 MG capsule Take 1 capsule (10 mg total) by mouth 3 (three) times daily as needed. For stomach cramps  90 capsule  3  . escitalopram (LEXAPRO) 10 MG tablet Take 1 tablet (10 mg total) by mouth daily.  90 tablet  1  . hydrochlorothiazide (HYDRODIURIL) 25 MG tablet Take 0.5 tablets (12.5 mg total) by mouth daily.  90 tablet  1  . levothyroxine (SYNTHROID) 125 MCG tablet Take 1 tablet (125 mcg total) by mouth daily before breakfast.  90 tablet  1  . losartan (COZAAR) 50 MG tablet Take 1 tablet (50 mg total) by mouth at bedtime.  90 tablet  1  . ondansetron (ZOFRAN) 4 MG tablet Take 1 tablet (4 mg total) by mouth every 6 (six) hours as needed. For nausea/vomiting  30 tablet  2  . pantoprazole (PROTONIX) 40 MG tablet Take 1 tablet (40 mg total) by mouth daily.  90 tablet  1  . pravastatin  (PRAVACHOL) 20 MG tablet Take 1 tablet (20 mg total) by mouth daily.  90 tablet  1  . pregabalin (LYRICA) 50 MG capsule Take 1 capsule (50 mg total) by mouth at bedtime.  90 capsule  1  . traZODone (DESYREL) 50 MG tablet Take 1 tablet (50 mg total) by mouth at bedtime as needed for sleep.  30 tablet  1   No current facility-administered medications on file prior to visit.    BP 118/72  Pulse 82  Temp(Src) 99.1 F (37.3 C) (Oral)  Ht 5\' 2"  (1.575 m)  Wt 120 lb (54.432 kg)  BMI 21.94 kg/m2    Objective:   Physical Exam  Constitutional: She is oriented to person, place, and time. She appears well-developed and well-nourished. No distress.  HENT:  Head: Normocephalic and atraumatic.  Right Ear: External ear normal.  Left Ear: External ear normal.  Oropharyngeal erythema  Neck: Neck supple.  Cardiovascular: Normal rate, regular rhythm and normal heart sounds.   Pulmonary/Chest: Effort normal.  Diffuse expiratory wheezing, no rhonchi, no dullness to percussion  Abdominal: Soft. Bowel sounds are normal.  Musculoskeletal: She exhibits no edema.  Lymphadenopathy:    She has no cervical adenopathy.  Neurological: She is alert and oriented to person, place, and time. No cranial nerve deficit.  Skin: Skin is warm and dry.  Psychiatric: She has a normal mood and affect. Her behavior is normal.          Assessment & Plan:

## 2013-11-26 NOTE — Assessment & Plan Note (Signed)
Discontinue metoprolol as it may be exacerbating her wheezing. Switch to more selective beta blocker-bisoprolol 2.5 mg once daily

## 2013-11-26 NOTE — Telephone Encounter (Signed)
Pt has appt today

## 2013-11-26 NOTE — Assessment & Plan Note (Signed)
Continue same dose of Percocet for now.

## 2013-11-26 NOTE — Assessment & Plan Note (Signed)
67 year old white female with history of COPD presents with acute asthmatic bronchitis. Patient treated with albuterol 2.5 mg/3 ML nebulizer x2. Patient given 80 mg of Depo-Medrol IM. Treat with Levaquin 500 mg once daily and prednisone taper. Samples of Symbicort 160/4.5 to use twice daily provided.  Patient understands she may need inpatient management should her symptoms fail to improve over the next 24-48 hours.

## 2013-11-27 DIAGNOSIS — J441 Chronic obstructive pulmonary disease with (acute) exacerbation: Secondary | ICD-10-CM

## 2013-11-27 MED ORDER — ALBUTEROL SULFATE (2.5 MG/3ML) 0.083% IN NEBU: 2.5000 mg | INHALATION_SOLUTION | Freq: Four times a day (QID) | RESPIRATORY_TRACT | Status: DC | PRN

## 2013-11-27 MED ORDER — METHYLPREDNISOLONE ACETATE 80 MG/ML IJ SUSP
80.0000 mg | Freq: Once | INTRAMUSCULAR | Status: AC
  Administered 2013-11-27: 80 mg via INTRAMUSCULAR

## 2013-11-27 NOTE — Addendum Note (Signed)
Addended by: Alfred Levins D on: 11/27/2013 12:01 PM   Modules accepted: Orders

## 2013-12-03 ENCOUNTER — Encounter: Payer: Self-pay | Admitting: Internal Medicine

## 2013-12-03 ENCOUNTER — Ambulatory Visit (INDEPENDENT_AMBULATORY_CARE_PROVIDER_SITE_OTHER)
Admission: RE | Admit: 2013-12-03 | Discharge: 2013-12-03 | Disposition: A | Discharge status: Home / Self Care | Payer: Medicare PPO | Source: Ambulatory Visit | Attending: Internal Medicine | Admitting: Internal Medicine

## 2013-12-03 ENCOUNTER — Ambulatory Visit (INDEPENDENT_AMBULATORY_CARE_PROVIDER_SITE_OTHER): Payer: Medicare PPO | Admitting: Internal Medicine

## 2013-12-03 VITALS — BP 144/90 | HR 51 | Temp 98.2°F | Wt 120.0 lb

## 2013-12-03 DIAGNOSIS — F341 Dysthymic disorder: Secondary | ICD-10-CM

## 2013-12-03 DIAGNOSIS — J441 Chronic obstructive pulmonary disease with (acute) exacerbation: Secondary | ICD-10-CM

## 2013-12-03 DIAGNOSIS — J40 Bronchitis, not specified as acute or chronic: Secondary | ICD-10-CM

## 2013-12-03 DIAGNOSIS — I10 Essential (primary) hypertension: Secondary | ICD-10-CM

## 2013-12-03 DIAGNOSIS — IMO0001 Reserved for inherently not codable concepts without codable children: Secondary | ICD-10-CM

## 2013-12-03 DIAGNOSIS — J45901 Unspecified asthma with (acute) exacerbation: Secondary | ICD-10-CM

## 2013-12-03 MED ORDER — CLONAZEPAM 1 MG PO TABS: 1.0000 mg | ORAL_TABLET | Freq: Every evening | ORAL | Status: DC | PRN

## 2013-12-03 MED ORDER — BISOPROLOL FUMARATE 5 MG PO TABS: 2.5000 mg | ORAL_TABLET | Freq: Every day | ORAL | Status: DC

## 2013-12-03 MED ORDER — LOSARTAN POTASSIUM-HCTZ 100-12.5 MG PO TABS: 1.0000 | ORAL_TABLET | Freq: Every day | ORAL | Status: DC

## 2013-12-03 MED ORDER — BUDESONIDE-FORMOTEROL FUMARATE 160-4.5 MCG/ACT IN AERO: 2.0000 | INHALATION_SPRAY | Freq: Two times a day (BID) | RESPIRATORY_TRACT | Status: DC

## 2013-12-03 MED ORDER — PREGABALIN 50 MG PO CAPS: 50.0000 mg | ORAL_CAPSULE | Freq: Every day | ORAL | Status: DC

## 2013-12-03 NOTE — Patient Instructions (Signed)
Please complete the following lab tests before your next follow up appointment: BMET - 401.9 

## 2013-12-03 NOTE — Assessment & Plan Note (Signed)
Improved with Pristiq 50 mg in addition to Lexapro 10 mg.

## 2013-12-03 NOTE — Assessment & Plan Note (Signed)
Patient's blood pressure has been trending upward since starting Pristiq. Discontinue losartan and HCTZ. Switch to combination therapy Hyzaar 100/12.5 mg once daily. Continue same dose of bisoprolol and clonidine. Arrange electrolytes and kidney function before next office visit

## 2013-12-03 NOTE — Assessment & Plan Note (Addendum)
Improved with Levaquin (Day 7) and prednisone taper. Patient has mild persistent wheezing on exam. Obtain chest x-ray.  Continue nebulizer treatments and Symbicort. Patient advised to call office if symptoms persist or worsen.  Patient advised to monitor for symptoms of oral thrush.

## 2013-12-03 NOTE — Progress Notes (Signed)
Pre visit review using our clinic review tool, if applicable. No additional management support is needed unless otherwise documented below in the visit note. 

## 2013-12-03 NOTE — Progress Notes (Signed)
Subjective:    Patient ID: Joann Ray, female    DOB: [birth year], 68 y.o.   MRN: [id number]  HPI  68 year old white female for followup regarding acute exacerbation of COPD with asthma, hypertension and fibromyalgia. Patient reports her coughing and wheezing significantly improved with starting Levaquin and prednisone taper. She is also using her albuterol nebulizer treatments on regular basis. She denies any fever or worsening shortness of breath.  Prednisone caused slight agitation. This has improved with tapering dose.  Hypertension-metoprolol was replaced with bisoprolol. Her heart rate has improved. Patient also reports feeling calmer. Unfortunately her home blood pressure readings are in the 347Q to 259D systolic.  Review of Systems Fibromyalgia improved with Pristiq.  Mood also improved.  Past Medical History  Diagnosis Date  . Anxiety   . Asthma   . Depression   . Hypertension   . Hypothyroidism   . COPD (chronic obstructive pulmonary disease)   . Fibromyalgia   . Hyperlipidemia   . Arrhythmia      AVNRT  . Anemia   . Migraine     History   Social History  . Marital Status: Married    Spouse Name: Sammy    Number of Children: 3  . Years of Education: 12th/[employer]    Occupational History  . DISABLED    Social History Main Topics  . Smoking status: Current Some Day Smoker -- 0.50 packs/day for 40 years    Types: Cigarettes    Last Attempt to Quit: 11/29/2009  . Smokeless tobacco: Never Used     Comment: September 2011, again Oct 2013  . Alcohol Use: No  . Drug Use: No  . Sexual Activity: Not on file   Other Topics Concern  . Not on file   Social History Narrative   She has 18 year old granddaughter-Maggie   Diagnosed with juvenile arthritis   Pt lives at home with spouse.    Caffeine Use:2-3 cups daily.    Past Surgical History  Procedure Laterality Date  . Appendectomy    . Abdominal hysterectomy    . Nasal sinus surgery  1993  .  Cardiac electrophysiology mapping and ablation  08/28/2010    EPS/RFA of AVNRT  - Dr. Crissie Sickles  . Coronary stent placement  09/2012    Family History  Problem Relation Age of Onset  . Colon cancer Sister 6  . Diabetes Mother   . Stroke Mother   . Dementia Mother   . Skin cancer Father   . Breast cancer Paternal Aunt   . Stomach cancer Paternal Aunt   . Stroke Sister     Allergies  Allergen Reactions  . Penicillins Anaphylaxis and Hives    Current Outpatient Prescriptions on File Prior to Visit  Medication Sig Dispense Refill  . albuterol (PROAIR HFA) 108 (90 BASE) MCG/ACT inhaler Inhale 2 puffs into the lungs 2 (two) times daily.  1 Inhaler  5  . albuterol (PROVENTIL) (2.5 MG/3ML) 0.083% nebulizer solution Take 3 mLs (2.5 mg total) by nebulization 4 (four) times daily as needed for wheezing or shortness of breath.  30 mL  11  . ALPRAZolam (XANAX) 0.25 MG tablet Take 1 tablet (0.25 mg total) by mouth 2 (two) times daily as needed for sleep or anxiety.  60 tablet  3  . cloNIDine (CATAPRES) 0.1 MG tablet Take 0.5 tablets (0.05 mg total) by mouth 2 (two) times daily.  90 tablet  1  . clopidogrel (PLAVIX) 75 MG tablet Take 1 tablet (  75 mg total) by mouth daily.  90 tablet  1  . desvenlafaxine (PRISTIQ) 50 MG 24 hr tablet Take 1 tablet (50 mg total) by mouth daily.  90 tablet  1  . dicyclomine (BENTYL) 10 MG capsule Take 1 capsule (10 mg total) by mouth 3 (three) times daily as needed. For stomach cramps  90 capsule  3  . escitalopram (LEXAPRO) 10 MG tablet Take 1 tablet (10 mg total) by mouth daily.  90 tablet  1  . levofloxacin (LEVAQUIN) 500 MG tablet Take 1 tablet (500 mg total) by mouth daily.  10 tablet  0  . levothyroxine (SYNTHROID) 125 MCG tablet Take 1 tablet (125 mcg total) by mouth daily before breakfast.  90 tablet  1  . ondansetron (ZOFRAN) 4 MG tablet Take 1 tablet (4 mg total) by mouth every 6 (six) hours as needed. For nausea/vomiting  30 tablet  2  .  oxyCODONE-acetaminophen (PERCOCET) 7.5-325 MG per tablet Take 1 tablet by mouth every 8 (eight) hours as needed for pain.  90 tablet  0  . pantoprazole (PROTONIX) 40 MG tablet Take 1 tablet (40 mg total) by mouth daily.  90 tablet  1  . pravastatin (PRAVACHOL) 20 MG tablet Take 1 tablet (20 mg total) by mouth daily.  90 tablet  1  . predniSONE (DELTASONE) 20 MG tablet 1 tab twice daily for 4 days, then 1/2 tab twice daily for 4 days, then 1/2 tab once daily for 4 days  14 tablet  0   No current facility-administered medications on file prior to visit.    BP 144/90  Pulse 51  Temp(Src) 98.2 F (36.8 C) (Oral)  Wt 120 lb (54.432 kg)  SpO2 98%       Objective:   Physical Exam  Constitutional: She is oriented to person, place, and time. She appears well-developed and well-nourished. No distress.  HENT:  Head: Normocephalic and atraumatic.  Mouth/Throat: Oropharynx is clear and moist.  No white film on tongue  Cardiovascular: Normal rate, regular rhythm and normal heart sounds.   No murmur heard. Pulmonary/Chest: Effort normal. She has wheezes.  Scattered expiratory wheezing  Neurological: She is alert and oriented to person, place, and time. No cranial nerve deficit.  Skin: Skin is warm and dry.  Psychiatric: She has a normal mood and affect. Her behavior is normal.          Assessment & Plan:

## 2013-12-03 NOTE — Assessment & Plan Note (Signed)
Improved with Pristiq.

## 2013-12-07 ENCOUNTER — Other Ambulatory Visit: Payer: Self-pay | Admitting: Internal Medicine

## 2013-12-07 DIAGNOSIS — M8580 Other specified disorders of bone density and structure, unspecified site: Secondary | ICD-10-CM

## 2013-12-10 ENCOUNTER — Ambulatory Visit: Payer: Medicare PPO | Admitting: Internal Medicine

## 2013-12-24 ENCOUNTER — Other Ambulatory Visit: Payer: Medicare PPO

## 2013-12-25 ENCOUNTER — Ambulatory Visit (INDEPENDENT_AMBULATORY_CARE_PROVIDER_SITE_OTHER)
Admission: RE | Admit: 2013-12-25 | Discharge: 2013-12-25 | Disposition: A | Discharge status: Home / Self Care | Payer: Medicare PPO | Source: Ambulatory Visit | Attending: Internal Medicine | Admitting: Internal Medicine

## 2013-12-25 ENCOUNTER — Other Ambulatory Visit (INDEPENDENT_AMBULATORY_CARE_PROVIDER_SITE_OTHER): Payer: Medicare PPO

## 2013-12-25 DIAGNOSIS — M949 Disorder of cartilage, unspecified: Secondary | ICD-10-CM

## 2013-12-25 DIAGNOSIS — M899 Disorder of bone, unspecified: Secondary | ICD-10-CM

## 2013-12-25 DIAGNOSIS — M8580 Other specified disorders of bone density and structure, unspecified site: Secondary | ICD-10-CM

## 2013-12-25 DIAGNOSIS — I10 Essential (primary) hypertension: Secondary | ICD-10-CM

## 2013-12-25 LAB — BASIC METABOLIC PANEL
BUN: 9 mg/dL (ref 6–23)
CHLORIDE: 99 meq/L (ref 96–112)
CO2: 27 mEq/L (ref 19–32)
Calcium: 9 mg/dL (ref 8.4–10.5)
Creatinine, Ser: 0.8 mg/dL (ref 0.4–1.2)
GFR: 79.87 mL/min (ref >60.00)
GLUCOSE: 96 mg/dL (ref 70–99)
POTASSIUM: 4.1 meq/L (ref 3.5–5.1)
SODIUM: 134 meq/L — AB (ref 135–145)

## 2014-01-02 ENCOUNTER — Encounter: Payer: Self-pay | Admitting: Internal Medicine

## 2014-01-02 ENCOUNTER — Ambulatory Visit (INDEPENDENT_AMBULATORY_CARE_PROVIDER_SITE_OTHER): Payer: Medicare PPO | Admitting: Internal Medicine

## 2014-01-02 VITALS — BP 118/72 | HR 56 | Temp 98.0°F | Ht 62.0 in | Wt 124.0 lb

## 2014-01-02 DIAGNOSIS — IMO0001 Reserved for inherently not codable concepts without codable children: Secondary | ICD-10-CM

## 2014-01-02 DIAGNOSIS — J449 Chronic obstructive pulmonary disease, unspecified: Secondary | ICD-10-CM

## 2014-01-02 DIAGNOSIS — I10 Essential (primary) hypertension: Secondary | ICD-10-CM

## 2014-01-02 DIAGNOSIS — E039 Hypothyroidism, unspecified: Secondary | ICD-10-CM

## 2014-01-02 MED ORDER — OXYCODONE-ACETAMINOPHEN 10-325 MG PO TABS: 0.5000 | ORAL_TABLET | Freq: Four times a day (QID) | ORAL | Status: DC | PRN

## 2014-01-02 MED ORDER — METOPROLOL SUCCINATE ER 50 MG PO TB24: 50.0000 mg | ORAL_TABLET | Freq: Every day | ORAL | Status: DC

## 2014-01-02 MED ORDER — ALBUTEROL SULFATE HFA 108 (90 BASE) MCG/ACT IN AERS: 2.0000 | INHALATION_SPRAY | Freq: Two times a day (BID) | RESPIRATORY_TRACT | Status: DC

## 2014-01-02 MED ORDER — LEVOTHYROXINE SODIUM 125 MCG PO TABS: 125.0000 ug | ORAL_TABLET | Freq: Every day | ORAL | Status: DC

## 2014-01-02 MED ORDER — OXYCODONE-ACETAMINOPHEN 7.5-325 MG PO TABS: 1.0000 | ORAL_TABLET | Freq: Three times a day (TID) | ORAL | Status: DC | PRN

## 2014-01-02 NOTE — Progress Notes (Signed)
Pre visit review using our clinic review tool, if applicable. No additional management support is needed unless otherwise documented below in the visit note. 

## 2014-01-02 NOTE — Patient Instructions (Signed)
Please complete the following lab tests before your next follow up appointment: BMET - 401.9 TSH - 244.9 

## 2014-01-02 NOTE — Progress Notes (Signed)
Subjective:    Patient ID: Joann Ray, female    DOB: 1946-07-02, 68 y.o.   MRN: [id number]  HPI  68 year old white female previously seen for COPD exacerbation, hypertension and fibromyalgia for followup. Patient completed her full course of Levaquin and prednisone taper. She is currently taking Symbicort maintenance inhaler twice daily. Her coughing and shortness of breath have significantly improved.  Her CXR was negative for pneumonia.  Patient complains that for  last several weeks she has experienced worsening fatigue and insomnia.  Hypertension-her blood pressure is better controlled. However her pulse is low.  Recent basic metabolic panel reviewed. She has mild hyponatremia, her sodium is 134.  Fibromyalgia-patient reports her symptoms better controlled when she was taking Percocet 10/325 one half tablet 4 times a day versus current dose of 7.5/325 mg 3 times daily.  Review of Systems No significant change in weight, no change in appetite CT of chest with IV contrast from 2012 reviewed.    Past Medical History  Diagnosis Date  . Anxiety   . Asthma   . Depression   . Hypertension   . Hypothyroidism   . COPD (chronic obstructive pulmonary disease)   . Fibromyalgia   . Hyperlipidemia   . Arrhythmia      AVNRT  . Anemia   . Migraine     History   Social History  . Marital Status: Married    Spouse Name: Sammy    Number of Children: 3  . Years of Education: 12th/[employer]    Occupational History  . DISABLED    Social History Main Topics  . Smoking status: Current Some Day Smoker -- 0.50 packs/day for 40 years    Types: Cigarettes    Last Attempt to Quit: 11/29/2009  . Smokeless tobacco: Never Used     Comment: September 2011, again Oct 2013  . Alcohol Use: No  . Drug Use: No  . Sexual Activity: Not on file   Other Topics Concern  . Not on file   Social History Narrative   She has 56 year old granddaughter-Maggie   Diagnosed with juvenile arthritis    Pt lives at home with spouse.    Caffeine Use:2-3 cups daily.    Past Surgical History  Procedure Laterality Date  . Appendectomy    . Abdominal hysterectomy    . Nasal sinus surgery  1993  . Cardiac electrophysiology mapping and ablation  08/28/2010    EPS/RFA of AVNRT  - Dr. Crissie Sickles  . Coronary stent placement  09/2012    Family History  Problem Relation Age of Onset  . Colon cancer Sister 67  . Diabetes Mother   . Stroke Mother   . Dementia Mother   . Skin cancer Father   . Breast cancer Paternal Aunt   . Stomach cancer Paternal Aunt   . Stroke Sister     Allergies  Allergen Reactions  . Penicillins Anaphylaxis and Hives    Current Outpatient Prescriptions on File Prior to Visit  Medication Sig Dispense Refill  . albuterol (PROVENTIL) (2.5 MG/3ML) 0.083% nebulizer solution Take 3 mLs (2.5 mg total) by nebulization 4 (four) times daily as needed for wheezing or shortness of breath.  30 mL  11  . ALPRAZolam (XANAX) 0.25 MG tablet Take 1 tablet (0.25 mg total) by mouth 2 (two) times daily as needed for sleep or anxiety.  60 tablet  3  . budesonide-formoterol (SYMBICORT) 160-4.5 MCG/ACT inhaler Inhale 2 puffs into the lungs 2 (two) times daily.  1 Inhaler  5  . clonazePAM (KLONOPIN) 1 MG tablet Take 1 tablet (1 mg total) by mouth at bedtime as needed. To help rest  30 tablet  5  . cloNIDine (CATAPRES) 0.1 MG tablet Take 0.5 tablets (0.05 mg total) by mouth 2 (two) times daily.  90 tablet  1  . clopidogrel (PLAVIX) 75 MG tablet Take 1 tablet (75 mg total) by mouth daily.  90 tablet  1  . desvenlafaxine (PRISTIQ) 50 MG 24 hr tablet Take 1 tablet (50 mg total) by mouth daily.  90 tablet  1  . dicyclomine (BENTYL) 10 MG capsule Take 1 capsule (10 mg total) by mouth 3 (three) times daily as needed. For stomach cramps  90 capsule  3  . escitalopram (LEXAPRO) 10 MG tablet Take 1 tablet (10 mg total) by mouth daily.  90 tablet  1  . losartan-hydrochlorothiazide (HYZAAR)  100-12.5 MG per tablet Take 1 tablet by mouth daily.  90 tablet  1  . ondansetron (ZOFRAN) 4 MG tablet Take 1 tablet (4 mg total) by mouth every 6 (six) hours as needed. For nausea/vomiting  30 tablet  2  . pantoprazole (PROTONIX) 40 MG tablet Take 1 tablet (40 mg total) by mouth daily.  90 tablet  1  . pravastatin (PRAVACHOL) 20 MG tablet Take 1 tablet (20 mg total) by mouth daily.  90 tablet  1  . pregabalin (LYRICA) 50 MG capsule Take 1 capsule (50 mg total) by mouth at bedtime.  90 capsule  1   No current facility-administered medications on file prior to visit.    BP 118/72  Pulse 56  Temp(Src) 98 F (36.7 C) (Oral)  Ht 5\' 2"  (1.575 m)  Wt 124 lb (56.246 kg)  BMI 22.67 kg/m2    Objective:   Physical Exam  Constitutional: She is oriented to person, place, and time. She appears well-developed and well-nourished. No distress.  HENT:  Head: Normocephalic and atraumatic.  Mouth/Throat: Oropharynx is clear and moist.  Eyes: EOM are normal. Pupils are equal, round, and reactive to light.  Neck: Neck supple.  Cardiovascular: Normal rate, regular rhythm and normal heart sounds.   No murmur heard. Pulmonary/Chest: Effort normal and breath sounds normal. She has no wheezes.  Musculoskeletal: She exhibits no edema.  Lymphadenopathy:    She has no cervical adenopathy.  Neurological: She is alert and oriented to person, place, and time. No cranial nerve deficit.  Skin: Skin is warm and dry.  Psychiatric: She has a normal mood and affect. Her behavior is normal.          Assessment & Plan:

## 2014-01-02 NOTE — Assessment & Plan Note (Signed)
Patient's recent COPD exacerbation improved with course of Levaquin and prednisone taper. She is now maintained on Symbicort 160/4.52 puffs twice daily. Her chest x-ray was negative for pneumonia. Obtain full PFTs.

## 2014-01-02 NOTE — Assessment & Plan Note (Signed)
Monitor TFTs.   Lab Results  Component Value Date   TSH 1.88 04/17/2013

## 2014-01-02 NOTE — Assessment & Plan Note (Signed)
I suspect patient's fatigue from bradycardia. Discontinue bisoprolol. Restart metoprolol XL 50 mg once daily.  Continue Hyzaar 100/12.5 mg once daily. Monitor electrolytes and kidney function before next office visit.

## 2014-01-02 NOTE — Assessment & Plan Note (Signed)
Patient experiencing exacerbation of her symptoms since medication change. Resume Percocet 10/325 one half tablet 4 times a day.Marland Kitchen

## 2014-01-03 ENCOUNTER — Telehealth: Payer: Self-pay | Admitting: Internal Medicine

## 2014-01-03 NOTE — Telephone Encounter (Signed)
Relevant patient education assigned to patient using Emmi. ° °

## 2014-01-04 ENCOUNTER — Telehealth: Payer: Self-pay | Admitting: Internal Medicine

## 2014-01-04 NOTE — Telephone Encounter (Signed)
Relevant patient education assigned to patient using Emmi. ° °

## 2014-02-06 ENCOUNTER — Other Ambulatory Visit: Payer: Medicare PPO

## 2014-02-13 ENCOUNTER — Ambulatory Visit (INDEPENDENT_AMBULATORY_CARE_PROVIDER_SITE_OTHER): Payer: Medicare PPO | Admitting: Internal Medicine

## 2014-02-13 ENCOUNTER — Encounter: Payer: Self-pay | Admitting: Internal Medicine

## 2014-02-13 VITALS — BP 130/80 | HR 64 | Temp 98.1°F | Ht 62.0 in | Wt 130.0 lb

## 2014-02-13 DIAGNOSIS — E039 Hypothyroidism, unspecified: Secondary | ICD-10-CM

## 2014-02-13 DIAGNOSIS — IMO0001 Reserved for inherently not codable concepts without codable children: Secondary | ICD-10-CM

## 2014-02-13 DIAGNOSIS — I10 Essential (primary) hypertension: Secondary | ICD-10-CM

## 2014-02-13 DIAGNOSIS — G8929 Other chronic pain: Secondary | ICD-10-CM

## 2014-02-13 DIAGNOSIS — M549 Dorsalgia, unspecified: Secondary | ICD-10-CM

## 2014-02-13 DIAGNOSIS — F341 Dysthymic disorder: Secondary | ICD-10-CM

## 2014-02-13 LAB — TSH: TSH: 0.97 u[IU]/mL (ref 0.35–5.50)

## 2014-02-13 LAB — BASIC METABOLIC PANEL
BUN: 10 mg/dL (ref 6–23)
CHLORIDE: 100 meq/L (ref 96–112)
CO2: 28 mEq/L (ref 19–32)
CREATININE: 0.8 mg/dL (ref 0.4–1.2)
Calcium: 8.9 mg/dL (ref 8.4–10.5)
GFR: 78.65 mL/min (ref >60.00)
GLUCOSE: 82 mg/dL (ref 70–99)
POTASSIUM: 5.1 meq/L (ref 3.5–5.1)
Sodium: 134 mEq/L — ABNORMAL LOW (ref 135–145)

## 2014-02-13 MED ORDER — ONDANSETRON HCL 4 MG PO TABS: 4.0000 mg | ORAL_TABLET | Freq: Four times a day (QID) | ORAL | Status: DC | PRN

## 2014-02-13 MED ORDER — OXYCODONE-ACETAMINOPHEN 10-325 MG PO TABS: 0.5000 | ORAL_TABLET | Freq: Four times a day (QID) | ORAL | Status: DC | PRN

## 2014-02-13 MED ORDER — DICYCLOMINE HCL 10 MG PO CAPS: 10.0000 mg | ORAL_CAPSULE | Freq: Three times a day (TID) | ORAL | Status: DC | PRN

## 2014-02-13 MED ORDER — PANTOPRAZOLE SODIUM 40 MG PO TBEC: 40.0000 mg | DELAYED_RELEASE_TABLET | Freq: Every day | ORAL | Status: DC

## 2014-02-13 NOTE — Patient Instructions (Signed)
Consider acupuncture therapy.  Dr. Harlene Salts [phone]

## 2014-02-13 NOTE — Assessment & Plan Note (Signed)
Continue same of Percocet.  Trial of PT.  Also recommended patient try acupuncture.

## 2014-02-13 NOTE — Assessment & Plan Note (Signed)
Well controlled.  Maintain current medication regimen. BP: 130/80 mmHg

## 2014-02-13 NOTE — Progress Notes (Signed)
Subjective:    Patient ID: Joann Ray, female    DOB: 21-Jan-1946, 68 y.o.   MRN: [id number]  HPI  68 year old white female with history of fibromyalgia, COPD, and hypertension for routine followup. Patient reports worsening fibromyalgias symptoms. She has had poor sleep. She complains of left-sided neck pain and pain near her left ribs. She has had issues with prominent left supraclavicular area. CT of chest was obtained in the past which was unremarkable.  Previous episode of bronchitis completely resolved.  Hypertension-stable  Review of Systems Chronic insomnia    Past Medical History  Diagnosis Date  . Anxiety   . Asthma   . Depression   . Hypertension   . Hypothyroidism   . COPD (chronic obstructive pulmonary disease)   . Fibromyalgia   . Hyperlipidemia   . Arrhythmia      AVNRT  . Anemia   . Migraine     History   Social History  . Marital Status: Married    Spouse Name: Sammy    Number of Children: 3  . Years of Education: 12th/[employer]    Occupational History  . DISABLED    Social History Main Topics  . Smoking status: Former Smoker -- 0.50 packs/day for 40 years    Types: Cigarettes    Quit date: 11/29/2009  . Smokeless tobacco: Never Used     Comment: September 2011, again Oct 2013  . Alcohol Use: No  . Drug Use: No  . Sexual Activity: Not on file   Other Topics Concern  . Not on file   Social History Narrative   She has 50 year old granddaughter-Maggie   Diagnosed with juvenile arthritis   Pt lives at home with spouse.    Caffeine Use:2-3 cups daily.    Past Surgical History  Procedure Laterality Date  . Appendectomy    . Abdominal hysterectomy    . Nasal sinus surgery  1993  . Cardiac electrophysiology mapping and ablation  08/28/2010    EPS/RFA of AVNRT  - Dr. Crissie Sickles  . Coronary stent placement  09/2012    Family History  Problem Relation Age of Onset  . Colon cancer Sister 56  . Diabetes Mother   . Stroke Mother     . Dementia Mother   . Skin cancer Father   . Breast cancer Paternal Aunt   . Stomach cancer Paternal Aunt   . Stroke Sister     Allergies  Allergen Reactions  . Penicillins Anaphylaxis and Hives    Current Outpatient Prescriptions on File Prior to Visit  Medication Sig Dispense Refill  . albuterol (PROAIR HFA) 108 (90 BASE) MCG/ACT inhaler Inhale 2 puffs into the lungs 2 (two) times daily.  1 Inhaler  5  . albuterol (PROVENTIL) (2.5 MG/3ML) 0.083% nebulizer solution Take 3 mLs (2.5 mg total) by nebulization 4 (four) times daily as needed for wheezing or shortness of breath.  30 mL  11  . ALPRAZolam (XANAX) 0.25 MG tablet Take 1 tablet (0.25 mg total) by mouth 2 (two) times daily as needed for sleep or anxiety.  60 tablet  3  . budesonide-formoterol (SYMBICORT) 160-4.5 MCG/ACT inhaler Inhale 2 puffs into the lungs 2 (two) times daily.  1 Inhaler  5  . clonazePAM (KLONOPIN) 1 MG tablet Take 1 tablet (1 mg total) by mouth at bedtime as needed. To help rest  30 tablet  5  . cloNIDine (CATAPRES) 0.1 MG tablet Take 0.5 tablets (0.05 mg total) by mouth 2 (two)  times daily.  90 tablet  1  . desvenlafaxine (PRISTIQ) 50 MG 24 hr tablet Take 1 tablet (50 mg total) by mouth daily.  90 tablet  1  . escitalopram (LEXAPRO) 10 MG tablet Take 1 tablet (10 mg total) by mouth daily.  90 tablet  1  . levothyroxine (SYNTHROID) 125 MCG tablet Take 1 tablet (125 mcg total) by mouth daily before breakfast.  90 tablet  1  . losartan-hydrochlorothiazide (HYZAAR) 100-12.5 MG per tablet Take 1 tablet by mouth daily.  90 tablet  1  . metoprolol succinate (TOPROL-XL) 50 MG 24 hr tablet Take 1 tablet (50 mg total) by mouth daily. Take with or immediately following a meal.  30 tablet  1  . pravastatin (PRAVACHOL) 20 MG tablet Take 1 tablet (20 mg total) by mouth daily.  90 tablet  1  . pregabalin (LYRICA) 50 MG capsule Take 1 capsule (50 mg total) by mouth at bedtime.  90 capsule  1   No current facility-administered  medications on file prior to visit.    BP 130/80  Pulse 64  Temp(Src) 98.1 F (36.7 C) (Oral)  Ht 5\' 2"  (1.575 m)  Wt 130 lb (58.968 kg)  BMI 23.77 kg/m2    Objective:   Physical Exam  Constitutional: She is oriented to person, place, and time. She appears well-developed and well-nourished. No distress.  HENT:  Head: Normocephalic and atraumatic.  Mouth/Throat: Oropharynx is clear and moist.  Neck: Neck supple.  Cardiovascular: Normal rate, regular rhythm and normal heart sounds.   Pulmonary/Chest: Effort normal and breath sounds normal. She has no wheezes.  Musculoskeletal: She exhibits no edema.  Prominence of left supraclavicular area.  Tenderness of left 1st rib  Lymphadenopathy:    She has no cervical adenopathy.  Neurological: She is alert and oriented to person, place, and time. No cranial nerve deficit.  Skin: Skin is warm and dry.  Psychiatric: She has a normal mood and affect. Her behavior is normal.          Assessment & Plan:

## 2014-02-13 NOTE — Progress Notes (Signed)
Pre visit review using our clinic review tool, if applicable. No additional management support is needed unless otherwise documented below in the visit note. 

## 2014-02-13 NOTE — Assessment & Plan Note (Signed)
68 year old white female with history of fibromyalgia has chronic upper thoracic and lower cervical pain. On exam she has prominent left first rib. Utilized myofascial release and HVLA  To thoracic spine and corrected first rib dysfunction. Patient tolerated well. No complications.  Refer to physical therapy for exercises. She may also benefit from using TENS unit.

## 2014-02-19 ENCOUNTER — Ambulatory Visit: Payer: Medicare PPO | Attending: Internal Medicine

## 2014-02-19 DIAGNOSIS — M546 Pain in thoracic spine: Secondary | ICD-10-CM | POA: Insufficient documentation

## 2014-02-19 DIAGNOSIS — IMO0001 Reserved for inherently not codable concepts without codable children: Secondary | ICD-10-CM | POA: Insufficient documentation

## 2014-02-19 DIAGNOSIS — M25519 Pain in unspecified shoulder: Secondary | ICD-10-CM | POA: Insufficient documentation

## 2014-02-19 DIAGNOSIS — M25619 Stiffness of unspecified shoulder, not elsewhere classified: Secondary | ICD-10-CM | POA: Insufficient documentation

## 2014-02-19 DIAGNOSIS — R5381 Other malaise: Secondary | ICD-10-CM | POA: Insufficient documentation

## 2014-02-28 ENCOUNTER — Ambulatory Visit: Payer: Medicare PPO | Admitting: Physical Therapy

## 2014-04-01 ENCOUNTER — Encounter: Payer: Self-pay | Admitting: Internal Medicine

## 2014-04-03 ENCOUNTER — Other Ambulatory Visit: Payer: Self-pay | Admitting: Internal Medicine

## 2014-04-05 ENCOUNTER — Other Ambulatory Visit: Payer: Self-pay | Admitting: Internal Medicine

## 2014-04-18 ENCOUNTER — Ambulatory Visit (INDEPENDENT_AMBULATORY_CARE_PROVIDER_SITE_OTHER): Payer: Medicare PPO | Admitting: Internal Medicine

## 2014-04-18 ENCOUNTER — Encounter: Payer: Self-pay | Admitting: Internal Medicine

## 2014-04-18 VITALS — BP 132/74 | HR 64 | Temp 98.0°F | Ht 62.0 in | Wt 129.0 lb

## 2014-04-18 DIAGNOSIS — E039 Hypothyroidism, unspecified: Secondary | ICD-10-CM

## 2014-04-18 DIAGNOSIS — I10 Essential (primary) hypertension: Secondary | ICD-10-CM

## 2014-04-18 DIAGNOSIS — F341 Dysthymic disorder: Secondary | ICD-10-CM

## 2014-04-18 DIAGNOSIS — IMO0001 Reserved for inherently not codable concepts without codable children: Secondary | ICD-10-CM

## 2014-04-18 MED ORDER — OXYCODONE-ACETAMINOPHEN 5-325 MG PO TABS: 1.0000 | ORAL_TABLET | Freq: Three times a day (TID) | ORAL | Status: DC | PRN

## 2014-04-18 MED ORDER — THYROID 65 MG PO TABS: 65.0000 mg | ORAL_TABLET | Freq: Every day | ORAL | Status: DC

## 2014-04-18 NOTE — Assessment & Plan Note (Signed)
Switch to armour thyroid 65 mg.  Repeat TSH in 2 months. Lab Results  Component Value Date   TSH 0.97 02/13/2014

## 2014-04-18 NOTE — Progress Notes (Signed)
Pre visit review using our clinic review tool, if applicable. No additional management support is needed unless otherwise documented below in the visit note. 

## 2014-04-18 NOTE — Assessment & Plan Note (Signed)
Patient hoping that switching to Armour Thyroid from levothyroxine will help with her fibromyalgia symptoms. Her pain level overall is less. Reduce Percocet to 5/325 3 times a day as needed.

## 2014-04-18 NOTE — Assessment & Plan Note (Signed)
Stable.  Her home BP readings are normal. BP: 132/74 mmHg

## 2014-04-18 NOTE — Assessment & Plan Note (Signed)
Patient tapered off both medication on her own.  She denies exacerbation of depressive symptoms.  Continue to monitor.

## 2014-04-18 NOTE — Patient Instructions (Signed)
Please complete the following lab tests before your next follow up appointment: TSH - 244.9 

## 2014-04-18 NOTE — Progress Notes (Signed)
Subjective:    Patient ID: Joann Ray, female    DOB: 1946-04-24, 68 y.o.   MRN: [id number]  HPI  68 year old white female with history of chronic fibromyalgia, depression, COPD and hypertension for followup.  Interval medical history-the patient tapered off both her Lexapro and Pristiq.  She denies exacerbation of depressive symptoms. Patient reports she is feeling more like herself off medication.  Hypothyroidism-patient interested in trying Armour Thyroid. Patient reports she has heard switching to Armour Thyroid may help her fibromyalgia symptoms.  Hypertension - she reduced her BP medications after stopping Pristiq.  She reports her BP readings at home are normal.    Review of Systems Negative for chest pain.  Left neck pain improved.    Past Medical History  Diagnosis Date  . Anxiety   . Asthma   . Depression   . Hypertension   . Hypothyroidism   . COPD (chronic obstructive pulmonary disease)   . Fibromyalgia   . Hyperlipidemia   . Arrhythmia      AVNRT  . Anemia   . Migraine     History   Social History  . Marital Status: Married    Spouse Name: Sammy    Number of Children: 3  . Years of Education: 12th/[employer]    Occupational History  . DISABLED    Social History Main Topics  . Smoking status: Former Smoker -- 0.50 packs/day for 40 years    Types: Cigarettes    Quit date: 11/29/2009  . Smokeless tobacco: Never Used     Comment: September 2011, again Oct 2013  . Alcohol Use: No  . Drug Use: No  . Sexual Activity: Not on file   Other Topics Concern  . Not on file   Social History Narrative   She has 59 year old granddaughter-Maggie   Diagnosed with juvenile arthritis   Pt lives at home with spouse.    Caffeine Use:2-3 cups daily.    Past Surgical History  Procedure Laterality Date  . Appendectomy    . Abdominal hysterectomy    . Nasal sinus surgery  1993  . Cardiac electrophysiology mapping and ablation  08/28/2010    EPS/RFA of  AVNRT  - Dr. Crissie Sickles  . Coronary stent placement  09/2012    Family History  Problem Relation Age of Onset  . Colon cancer Sister 19  . Diabetes Mother   . Stroke Mother   . Dementia Mother   . Skin cancer Father   . Breast cancer Paternal Aunt   . Stomach cancer Paternal Aunt   . Stroke Sister     Allergies  Allergen Reactions  . Penicillins Anaphylaxis and Hives    Current Outpatient Prescriptions on File Prior to Visit  Medication Sig Dispense Refill  . albuterol (PROAIR HFA) 108 (90 BASE) MCG/ACT inhaler Inhale 2 puffs into the lungs 2 (two) times daily.  1 Inhaler  5  . albuterol (PROVENTIL) (2.5 MG/3ML) 0.083% nebulizer solution Take 3 mLs (2.5 mg total) by nebulization 4 (four) times daily as needed for wheezing or shortness of breath.  30 mL  11  . ALPRAZolam (XANAX) 0.25 MG tablet TAKE ONE TABLET BY MOUTH TWICE DAILY AS NEEDED FOR ANXIETY  60 tablet  3  . aspirin 81 MG chewable tablet Chew 81 mg by mouth daily.      . clonazePAM (KLONOPIN) 1 MG tablet Take 1 tablet (1 mg total) by mouth at bedtime as needed. To help rest  30 tablet  5  .  cloNIDine (CATAPRES) 0.1 MG tablet Take 0.5 tablets (0.05 mg total) by mouth 2 (two) times daily.  90 tablet  1  . escitalopram (LEXAPRO) 10 MG tablet Take 1 tablet (10 mg total) by mouth daily.  90 tablet  1  . losartan-hydrochlorothiazide (HYZAAR) 100-12.5 MG per tablet Take 1 tablet by mouth daily.  90 tablet  1  . pantoprazole (PROTONIX) 40 MG tablet Take 1 tablet (40 mg total) by mouth daily.  90 tablet  1  . pravastatin (PRAVACHOL) 20 MG tablet Take 1 tablet (20 mg total) by mouth daily.  90 tablet  1  . pregabalin (LYRICA) 50 MG capsule Take 1 capsule (50 mg total) by mouth at bedtime.  90 capsule  1   No current facility-administered medications on file prior to visit.    BP 132/74  Pulse 64  Temp(Src) 98 F (36.7 C) (Oral)  Ht 5\' 2"  (1.575 m)  Wt 129 lb (58.514 kg)  BMI 23.59 kg/m2    Objective:   Physical Exam    Constitutional: She is oriented to person, place, and time. She appears well-developed and well-nourished. No distress.  Cardiovascular: Normal rate, regular rhythm and normal heart sounds.   No murmur heard. Pulmonary/Chest: Effort normal and breath sounds normal. She has no wheezes.  Neurological: She is alert and oriented to person, place, and time. No cranial nerve deficit.  Psychiatric: She has a normal mood and affect. Her behavior is normal.       Assessment & Plan:

## 2014-04-19 ENCOUNTER — Ambulatory Visit: Payer: Medicare PPO | Admitting: Internal Medicine

## 2014-04-26 ENCOUNTER — Other Ambulatory Visit: Payer: Self-pay | Admitting: *Deleted

## 2014-04-26 MED ORDER — CLONIDINE HCL 0.1 MG PO TABS: 0.0500 mg | ORAL_TABLET | Freq: Two times a day (BID) | ORAL | Status: DC

## 2014-05-03 ENCOUNTER — Ambulatory Visit (INDEPENDENT_AMBULATORY_CARE_PROVIDER_SITE_OTHER): Payer: Medicare PPO | Admitting: Internal Medicine

## 2014-05-03 ENCOUNTER — Encounter: Payer: Self-pay | Admitting: Internal Medicine

## 2014-05-03 VITALS — BP 162/90 | Temp 97.9°F | Wt 126.0 lb

## 2014-05-03 DIAGNOSIS — G8929 Other chronic pain: Secondary | ICD-10-CM

## 2014-05-03 DIAGNOSIS — M549 Dorsalgia, unspecified: Secondary | ICD-10-CM

## 2014-05-03 MED ORDER — LIDOCAINE 5 % EX PTCH: 1.0000 | MEDICATED_PATCH | Freq: Two times a day (BID) | CUTANEOUS | Status: DC

## 2014-05-03 MED ORDER — OXYCODONE HCL 5 MG PO TABS: ORAL_TABLET | ORAL | Status: DC

## 2014-05-03 NOTE — Assessment & Plan Note (Signed)
Patient experiencing exacerbation of low back pain after working in her yard one week ago. On exam she has spasm of lumbar paraspinal muscles. Utilized myofascial release techniques to lumbar and thoracic spine.  Trial of Lidoderm patches. If no relief, patient advised to use prescription for oxycodone 5 mg. She is to take additional half to 1 tablet of oxycodone with her usual dose of Percocet.  Patient advised to call office if symptoms persist or worsen.

## 2014-05-03 NOTE — Progress Notes (Signed)
Subjective:    Patient ID: Joann Ray, female    DOB: 1946/07/03, 68 y.o.   MRN: [id number]  HPI  68 year old white female with history of fibromyalgia complains of low back pain.  Her symptoms started after working in the yard with her husband a week ago. Patient describes a "knot" in her lower back. She has mild radiation of symptoms to her buttock area. She denies any lower extremity weakness.  Patient reports blood pressure elevated secondary to pain response. She increased her losartan and hydrochlorothiazide dose to full tablet.  Review of Systems Negative for urinary complaints, no fever or chills    Past Medical History  Diagnosis Date  . Anxiety   . Asthma   . Depression   . Hypertension   . Hypothyroidism   . COPD (chronic obstructive pulmonary disease)   . Fibromyalgia   . Hyperlipidemia   . Arrhythmia      AVNRT  . Anemia   . Migraine     History   Social History  . Marital Status: Married    Spouse Name: Sammy    Number of Children: 3  . Years of Education: 12th/[employer]    Occupational History  . DISABLED    Social History Main Topics  . Smoking status: Former Smoker -- 0.50 packs/day for 40 years    Types: Cigarettes    Quit date: 11/29/2009  . Smokeless tobacco: Never Used     Comment: September 2011, again Oct 2013  . Alcohol Use: No  . Drug Use: No  . Sexual Activity: Not on file   Other Topics Concern  . Not on file   Social History Narrative   She has 40 year old granddaughter-Maggie   Diagnosed with juvenile arthritis   Pt lives at home with spouse.    Caffeine Use:2-3 cups daily.    Past Surgical History  Procedure Laterality Date  . Appendectomy    . Abdominal hysterectomy    . Nasal sinus surgery  1993  . Cardiac electrophysiology mapping and ablation  08/28/2010    EPS/RFA of AVNRT  - Dr. Crissie Sickles  . Coronary stent placement  09/2012    Family History  Problem Relation Age of Onset  . Colon cancer Sister 43  .  Diabetes Mother   . Stroke Mother   . Dementia Mother   . Skin cancer Father   . Breast cancer Paternal Aunt   . Stomach cancer Paternal Aunt   . Stroke Sister     Allergies  Allergen Reactions  . Penicillins Anaphylaxis and Hives    Current Outpatient Prescriptions on File Prior to Visit  Medication Sig Dispense Refill  . albuterol (PROAIR HFA) 108 (90 BASE) MCG/ACT inhaler Inhale 2 puffs into the lungs 2 (two) times daily.  1 Inhaler  5  . albuterol (PROVENTIL) (2.5 MG/3ML) 0.083% nebulizer solution Take 3 mLs (2.5 mg total) by nebulization 4 (four) times daily as needed for wheezing or shortness of breath.  30 mL  11  . ALPRAZolam (XANAX) 0.25 MG tablet TAKE ONE TABLET BY MOUTH TWICE DAILY AS NEEDED FOR ANXIETY  60 tablet  3  . aspirin 81 MG chewable tablet Chew 81 mg by mouth daily.      . clonazePAM (KLONOPIN) 1 MG tablet Take 1 tablet (1 mg total) by mouth at bedtime as needed. To help rest  30 tablet  5  . cloNIDine (CATAPRES) 0.1 MG tablet Take 0.5 tablets (0.05 mg total) by mouth 2 (two)  times daily.  90 tablet  1  . losartan-hydrochlorothiazide (HYZAAR) 100-12.5 MG per tablet Take 0.5 tablets by mouth daily.  90 tablet  1  . oxyCODONE-acetaminophen (PERCOCET/ROXICET) 5-325 MG per tablet Take 1 tablet by mouth every 8 (eight) hours as needed for severe pain.  90 tablet  0  . pantoprazole (PROTONIX) 40 MG tablet Take 1 tablet (40 mg total) by mouth daily.  90 tablet  1  . pravastatin (PRAVACHOL) 20 MG tablet Take 1 tablet (20 mg total) by mouth daily.  90 tablet  1  . pregabalin (LYRICA) 50 MG capsule Take 1 capsule (50 mg total) by mouth at bedtime.  90 capsule  1  . thyroid (ARMOUR) 65 MG tablet Take 1 tablet (65 mg total) by mouth daily.  90 tablet  0   No current facility-administered medications on file prior to visit.    BP 162/90  Temp(Src) 97.9 F (36.6 C) (Oral)  Wt 126 lb (57.153 kg)    Objective:   Physical Exam  Constitutional: She is oriented to person,  place, and time. She appears well-developed and well-nourished.  Cardiovascular: Normal rate, regular rhythm and normal heart sounds.   Musculoskeletal: She exhibits no edema.  Spasm of lumbar paraspinal muscles.  Increase in low back pain with lumbar extension  Neurological: She is alert and oriented to person, place, and time. She has normal reflexes. She displays normal reflexes. She exhibits normal muscle tone.  Psychiatric: She has a normal mood and affect.        Assessment & Plan:

## 2014-05-03 NOTE — Progress Notes (Signed)
Pre visit review using our clinic review tool, if applicable. No additional management support is needed unless otherwise documented below in the visit note. 

## 2014-05-03 NOTE — Patient Instructions (Signed)
Try using lidoderm patches first for low back pain Avoid strenuous activity for 1-2 weeks Use oxycodone 5 mg 1/2 to 1 tablet along with your percocet for severe low back pain as directed Please contact our office if your symptoms do not improve or gets worse.

## 2014-05-08 ENCOUNTER — Telehealth: Payer: Self-pay | Admitting: Internal Medicine

## 2014-05-08 NOTE — Telephone Encounter (Signed)
She will just have to use the short term rx for oxycodone.

## 2014-05-08 NOTE — Telephone Encounter (Signed)
[employer] denied the PA request for Lidoderm 5% patch.  Medication prescribe does not meet the criteria and isn't covered.  I am sending you the denial letter to review.  The letter can go to scanning once you are done with it.    I called [employer] and was advised the pt must have a diagnosis of post-herpetic neuralgia or diabetic neuropathy.

## 2014-06-06 ENCOUNTER — Ambulatory Visit (INDEPENDENT_AMBULATORY_CARE_PROVIDER_SITE_OTHER): Payer: Medicare PPO | Admitting: Internal Medicine

## 2014-06-06 ENCOUNTER — Encounter: Payer: Self-pay | Admitting: Internal Medicine

## 2014-06-06 VITALS — BP 122/66 | HR 64 | Temp 98.3°F | Ht 62.0 in | Wt 129.0 lb

## 2014-06-06 DIAGNOSIS — I10 Essential (primary) hypertension: Secondary | ICD-10-CM

## 2014-06-06 DIAGNOSIS — E039 Hypothyroidism, unspecified: Secondary | ICD-10-CM

## 2014-06-06 DIAGNOSIS — G8929 Other chronic pain: Secondary | ICD-10-CM

## 2014-06-06 DIAGNOSIS — M549 Dorsalgia, unspecified: Secondary | ICD-10-CM

## 2014-06-06 LAB — BASIC METABOLIC PANEL
BUN: 17 mg/dL (ref 6–23)
CALCIUM: 9.6 mg/dL (ref 8.4–10.5)
CO2: 25 mEq/L (ref 19–32)
Chloride: 99 mEq/L (ref 96–112)
Creatinine, Ser: 1.1 mg/dL (ref 0.4–1.2)
GFR: 51.76 mL/min — ABNORMAL LOW (ref >60.00)
Glucose, Bld: 115 mg/dL — ABNORMAL HIGH (ref 70–99)
Potassium: 5.2 mEq/L — ABNORMAL HIGH (ref 3.5–5.1)
Sodium: 133 mEq/L — ABNORMAL LOW (ref 135–145)

## 2014-06-06 LAB — TSH: TSH: 3.75 u[IU]/mL (ref 0.35–4.50)

## 2014-06-06 MED ORDER — KLONOPIN 1 MG PO TABS: 1.0000 mg | ORAL_TABLET | Freq: Every day | ORAL | Status: DC

## 2014-06-06 MED ORDER — OXYCODONE-ACETAMINOPHEN 5-325 MG PO TABS: 1.0000 | ORAL_TABLET | Freq: Three times a day (TID) | ORAL | Status: DC | PRN

## 2014-06-06 MED ORDER — PRAVASTATIN SODIUM 20 MG PO TABS: 20.0000 mg | ORAL_TABLET | Freq: Every day | ORAL | Status: DC

## 2014-06-06 NOTE — Progress Notes (Signed)
Pre visit review using our clinic review tool, if applicable. No additional management support is needed unless otherwise documented below in the visit note. 

## 2014-06-06 NOTE — Assessment & Plan Note (Signed)
Well controlled.   BP: 122/66 mmHg  Lab Results  Component Value Date   CREATININE 0.8 02/13/2014

## 2014-06-06 NOTE — Assessment & Plan Note (Signed)
Continue chronic dose of percocet.

## 2014-06-06 NOTE — Assessment & Plan Note (Signed)
Monitor TSH 

## 2014-06-06 NOTE — Patient Instructions (Signed)
Schedule next visit as medicare physical.

## 2014-06-06 NOTE — Progress Notes (Signed)
Subjective:    Patient ID: Joann Ray, female    DOB: [birth year], 68 y.o.   MRN: [id number]  HPI  68 year old white female with history of hypertension, hypothyroidism and chronic fibromyalgia for routine followup. Patient reports feeling much better.  At previous visit she was treated for low back pain. She reports Lidoderm patches helped.   Hypothyroidism-patient switched to Armour Thyroid. Patient feels more energetic with taking Armour Thyroid.  Hypertension-improved with resolution of low back pain. She has resumed half tablet of losartan hydrochlorothiazide.   Review of Systems Negative for weight change, negative for depressive symptoms    Past Medical History  Diagnosis Date  . Anxiety   . Asthma   . Depression   . Hypertension   . Hypothyroidism   . COPD (chronic obstructive pulmonary disease)   . Fibromyalgia   . Hyperlipidemia   . Arrhythmia      AVNRT  . Anemia   . Migraine     History   Social History  . Marital Status: Married    Spouse Name: Sammy    Number of Children: 3  . Years of Education: 12th/[employer]    Occupational History  . DISABLED    Social History Main Topics  . Smoking status: Former Smoker -- 0.50 packs/day for 40 years    Types: Cigarettes    Quit date: 11/29/2009  . Smokeless tobacco: Never Used     Comment: September 2011, again Oct 2013  . Alcohol Use: No  . Drug Use: No  . Sexual Activity: Not on file   Other Topics Concern  . Not on file   Social History Narrative   She has 60 year old granddaughter-Maggie   Diagnosed with juvenile arthritis   Pt lives at home with spouse.    Caffeine Use:2-3 cups daily.    Past Surgical History  Procedure Laterality Date  . Appendectomy    . Abdominal hysterectomy    . Nasal sinus surgery  1993  . Cardiac electrophysiology mapping and ablation  08/28/2010    EPS/RFA of AVNRT  - Dr. Crissie Sickles  . Coronary stent placement  09/2012    Family History  Problem Relation  Age of Onset  . Colon cancer Sister 78  . Diabetes Mother   . Stroke Mother   . Dementia Mother   . Skin cancer Father   . Breast cancer Paternal Aunt   . Stomach cancer Paternal Aunt   . Stroke Sister     Allergies  Allergen Reactions  . Penicillins Anaphylaxis and Hives    Current Outpatient Prescriptions on File Prior to Visit  Medication Sig Dispense Refill  . albuterol (PROAIR HFA) 108 (90 BASE) MCG/ACT inhaler Inhale 2 puffs into the lungs 2 (two) times daily.  1 Inhaler  5  . albuterol (PROVENTIL) (2.5 MG/3ML) 0.083% nebulizer solution Take 3 mLs (2.5 mg total) by nebulization 4 (four) times daily as needed for wheezing or shortness of breath.  30 mL  11  . ALPRAZolam (XANAX) 0.25 MG tablet TAKE ONE TABLET BY MOUTH TWICE DAILY AS NEEDED FOR ANXIETY  60 tablet  3  . aspirin 81 MG chewable tablet Chew 81 mg by mouth daily.      . cloNIDine (CATAPRES) 0.1 MG tablet Take 0.5 tablets (0.05 mg total) by mouth 2 (two) times daily.  90 tablet  1  . lidocaine (LIDODERM) 5 % Place 1 patch onto the skin every 12 (twelve) hours. Remove & Discard patch within 12 hours or  as directed by MD  10 patch  0  . losartan-hydrochlorothiazide (HYZAAR) 100-12.5 MG per tablet Take 0.5 tablets by mouth daily.  90 tablet  1  . oxyCODONE (OXY IR/ROXICODONE) 5 MG immediate release tablet Take 1/2 to 1 tablet along with your percocet every 8 hrs for severe low back pain  30 tablet  0  . pantoprazole (PROTONIX) 40 MG tablet Take 1 tablet (40 mg total) by mouth daily.  90 tablet  1  . pregabalin (LYRICA) 50 MG capsule Take 1 capsule (50 mg total) by mouth at bedtime.  90 capsule  1  . thyroid (ARMOUR) 65 MG tablet Take 1 tablet (65 mg total) by mouth daily.  90 tablet  0   No current facility-administered medications on file prior to visit.    BP 122/66  Pulse 64  Temp(Src) 98.3 F (36.8 C) (Oral)  Ht 5\' 2"  (1.575 m)  Wt 129 lb (58.514 kg)  BMI 23.59 kg/m2    Objective:   Physical Exam    Constitutional: She appears well-developed and well-nourished. No distress.  HENT:  Head: Normocephalic and atraumatic.  Cardiovascular: Normal rate, regular rhythm and normal heart sounds.   No murmur heard. Pulmonary/Chest: Effort normal and breath sounds normal. She has no wheezes.  Musculoskeletal: She exhibits no edema.  Neurological: She is alert. No cranial nerve deficit.  Skin: Skin is warm and dry.  Psychiatric: She has a normal mood and affect. Her behavior is normal.          Assessment & Plan:

## 2014-06-10 ENCOUNTER — Ambulatory Visit: Payer: Medicare PPO | Admitting: Internal Medicine

## 2014-06-14 ENCOUNTER — Telehealth: Payer: Self-pay | Admitting: Internal Medicine

## 2014-06-14 MED ORDER — PREGABALIN 50 MG PO CAPS: 50.0000 mg | ORAL_CAPSULE | Freq: Every day | ORAL | Status: DC

## 2014-06-14 NOTE — Telephone Encounter (Signed)
[id number] - [place], [place] - [address] is requesting re-fill on pregabalin (LYRICA) 50 MG capsule

## 2014-06-14 NOTE — Telephone Encounter (Signed)
Ok to refill x 3 

## 2014-06-14 NOTE — Telephone Encounter (Signed)
Okay to fill? 

## 2014-07-01 ENCOUNTER — Telehealth: Payer: Self-pay | Admitting: *Deleted

## 2014-07-01 MED ORDER — PREGABALIN 25 MG PO CAPS: ORAL_CAPSULE | ORAL | Status: DC

## 2014-07-01 NOTE — Telephone Encounter (Signed)
Pt takes lyrica 50 mg qhs and she would like to take a 25 mg in the morning and 50 mg qhs.  Pt states that the lyrica 50 mg helps her at night but it makes her groggy if she takes it in the morning.  Per Dr Shawna Orleans ok to change to Lyrica 25 mg 1 in the morning 2 cap qhs.  #90/3 refills.  Pt aware.  Rx called in to [facility].

## 2014-07-05 ENCOUNTER — Ambulatory Visit (INDEPENDENT_AMBULATORY_CARE_PROVIDER_SITE_OTHER): Payer: Medicare PPO | Admitting: Internal Medicine

## 2014-07-05 ENCOUNTER — Encounter: Payer: Self-pay | Admitting: Internal Medicine

## 2014-07-05 VITALS — BP 144/70 | HR 80 | Temp 98.4°F | Ht 62.0 in | Wt 128.0 lb

## 2014-07-05 DIAGNOSIS — R519 Headache, unspecified: Secondary | ICD-10-CM | POA: Insufficient documentation

## 2014-07-05 DIAGNOSIS — I10 Essential (primary) hypertension: Secondary | ICD-10-CM

## 2014-07-05 DIAGNOSIS — F430 Acute stress reaction: Secondary | ICD-10-CM

## 2014-07-05 DIAGNOSIS — E039 Hypothyroidism, unspecified: Secondary | ICD-10-CM

## 2014-07-05 DIAGNOSIS — R51 Headache: Secondary | ICD-10-CM

## 2014-07-05 MED ORDER — SYNTHROID 125 MCG PO TABS: 125.0000 ug | ORAL_TABLET | Freq: Every day | ORAL | Status: DC

## 2014-07-05 MED ORDER — ATENOLOL 25 MG PO TABS: 12.5000 mg | ORAL_TABLET | Freq: Every day | ORAL | Status: DC

## 2014-07-05 MED ORDER — CITALOPRAM HYDROBROMIDE 10 MG PO TABS: 10.0000 mg | ORAL_TABLET | Freq: Every day | ORAL | Status: DC

## 2014-07-05 MED ORDER — CLONAZEPAM 1 MG PO TABS: 1.0000 mg | ORAL_TABLET | Freq: Every day | ORAL | Status: DC

## 2014-07-05 NOTE — Progress Notes (Signed)
Subjective:    Patient ID: Joann Ray, female    DOB: 24-Jul-1946, 68 y.o.   MRN: [id number]  HPI  68 year old white female with history of fibromyalgia, hypothyroidism, hypertension and depression for followup. Patient reports significant stress reaction since previous visit. She has had arguments with several members of her family and her husband.  Her stress reaction causes right-sided headache. She describes pressure-like sensation. She also has difficulty sleeping. Patient reports her frustration level is very high.  Hypothyroidism-patient wanted to switch to Armour Thyroid. Patient feels like it made her symptoms worse. She resumed taking Synthroid 125 mcg.  Chronic fibromyalgia-her musculoskeletal symptoms have been worse. She reports taking more pain medications than usual.  Hypertension - BP is higher than usual.  She is taking full dose of Losartan / Hctz.  Patient previously on atenolol in the remote past. She reports it helped her anxiety symptoms in the past. She could not tolerate metoprolol.  Review of Systems Negative for chest pain, right sided posterior headache.  No visual changes.  Negative for nausea.    Past Medical History  Diagnosis Date  . Anxiety   . Asthma   . Depression   . Hypertension   . Hypothyroidism   . COPD (chronic obstructive pulmonary disease)   . Fibromyalgia   . Hyperlipidemia   . Arrhythmia      AVNRT  . Anemia   . Migraine     History   Social History  . Marital Status: Married    Spouse Name: Sammy    Number of Children: 3  . Years of Education: 12th/[employer]    Occupational History  . DISABLED    Social History Main Topics  . Smoking status: Former Smoker -- 0.50 packs/day for 40 years    Types: Cigarettes    Quit date: 11/29/2009  . Smokeless tobacco: Never Used     Comment: September 2011, again Oct 2013  . Alcohol Use: No  . Drug Use: No  . Sexual Activity: Not on file   Other Topics Concern  . Not on file     Social History Narrative   She has 29 year old granddaughter-Maggie   Diagnosed with juvenile arthritis   Pt lives at home with spouse.    Caffeine Use:2-3 cups daily.    Past Surgical History  Procedure Laterality Date  . Appendectomy    . Abdominal hysterectomy    . Nasal sinus surgery  1993  . Cardiac electrophysiology mapping and ablation  08/28/2010    EPS/RFA of AVNRT  - Dr. Crissie Sickles  . Coronary stent placement  09/2012    Family History  Problem Relation Age of Onset  . Colon cancer Sister 86  . Diabetes Mother   . Stroke Mother   . Dementia Mother   . Skin cancer Father   . Breast cancer Paternal Aunt   . Stomach cancer Paternal Aunt   . Stroke Sister     Allergies  Allergen Reactions  . Penicillins Anaphylaxis and Hives    Current Outpatient Prescriptions on File Prior to Visit  Medication Sig Dispense Refill  . albuterol (PROAIR HFA) 108 (90 BASE) MCG/ACT inhaler Inhale 2 puffs into the lungs 2 (two) times daily.  1 Inhaler  5  . albuterol (PROVENTIL) (2.5 MG/3ML) 0.083% nebulizer solution Take 3 mLs (2.5 mg total) by nebulization 4 (four) times daily as needed for wheezing or shortness of breath.  30 mL  11  . ALPRAZolam (XANAX) 0.25 MG tablet TAKE  ONE TABLET BY MOUTH TWICE DAILY AS NEEDED FOR ANXIETY  60 tablet  3  . aspirin 81 MG chewable tablet Chew 81 mg by mouth daily.      . cloNIDine (CATAPRES) 0.1 MG tablet Take 0.5 tablets (0.05 mg total) by mouth 2 (two) times daily.  90 tablet  1  . losartan-hydrochlorothiazide (HYZAAR) 100-12.5 MG per tablet Take 1 tablet by mouth daily.   90 tablet  1  . oxyCODONE-acetaminophen (PERCOCET/ROXICET) 5-325 MG per tablet Take 1 tablet by mouth every 8 (eight) hours as needed for severe pain.  90 tablet  0  . oxyCODONE-acetaminophen (PERCOCET/ROXICET) 5-325 MG per tablet Take 1 tablet by mouth every 8 (eight) hours as needed for severe pain.  90 tablet  0  . pantoprazole (PROTONIX) 40 MG tablet Take 1 tablet (40 mg  total) by mouth daily.  90 tablet  1  . pravastatin (PRAVACHOL) 20 MG tablet Take 1 tablet (20 mg total) by mouth daily.  90 tablet  1  . pregabalin (LYRICA) 25 MG capsule Take 1 capsule in the morning and 2 capsules qhs  90 capsule  3   No current facility-administered medications on file prior to visit.    BP 144/70  Pulse 80  Temp(Src) 98.4 F (36.9 C) (Oral)  Ht 5\' 2"  (1.575 m)  Wt 128 lb (58.06 kg)  BMI 23.41 kg/m2    Objective:   Physical Exam  Constitutional: She is oriented to person, place, and time. She appears well-developed and well-nourished. No distress.  HENT:  Head: Normocephalic and atraumatic.  Tightness of right upper cervical paraspinal muscles.  Eyes: EOM are normal. Pupils are equal, round, and reactive to light.  Cardiovascular: Normal rate, regular rhythm and normal heart sounds.   No murmur heard. Pulmonary/Chest: Effort normal and breath sounds normal. She has no wheezes.  Musculoskeletal: She exhibits no edema.  Neurological: She is alert and oriented to person, place, and time. No cranial nerve deficit. She exhibits normal muscle tone. Coordination normal.  Skin: Skin is warm and dry.  Psychiatric:  Stressed out          Assessment & Plan:

## 2014-07-05 NOTE — Assessment & Plan Note (Signed)
Patient's blood pressure exacerbated by stress reaction. She would like to try restarting beta blocker. She has had good response to Tenormin in the past. Start low-dose to 12.5 mg once daily. BP: 144/70 mmHg

## 2014-07-05 NOTE — Progress Notes (Signed)
Pre visit review using our clinic review tool, if applicable. No additional management support is needed unless otherwise documented below in the visit note. 

## 2014-07-05 NOTE — Assessment & Plan Note (Signed)
Likely stress related.  Monitor for now.  If persistent or worsening symptoms, consider MRI of Brain.

## 2014-07-05 NOTE — Assessment & Plan Note (Addendum)
Patient tried switching from Synthroid to Armour Thyroid. Patient reports it exacerbated her anxiety symptoms. She has resumed taking Synthroid brand name 125 mcg once daily.  Lab Results  Component Value Date   TSH 3.75 06/06/2014

## 2014-07-05 NOTE — Assessment & Plan Note (Signed)
Patient has been on several different antidepressants in the past. She discontinued on her own. I suggest she restart SSRI citalopram 10 mg once daily.

## 2014-07-29 ENCOUNTER — Other Ambulatory Visit: Payer: Self-pay | Admitting: Internal Medicine

## 2014-07-30 ENCOUNTER — Encounter: Payer: Self-pay | Admitting: Internal Medicine

## 2014-07-30 ENCOUNTER — Ambulatory Visit (INDEPENDENT_AMBULATORY_CARE_PROVIDER_SITE_OTHER): Payer: Medicare PPO | Admitting: Internal Medicine

## 2014-07-30 VITALS — BP 125/66 | HR 57 | Temp 97.9°F | Wt 127.0 lb

## 2014-07-30 DIAGNOSIS — I10 Essential (primary) hypertension: Secondary | ICD-10-CM

## 2014-07-30 DIAGNOSIS — F430 Acute stress reaction: Secondary | ICD-10-CM

## 2014-07-30 DIAGNOSIS — M549 Dorsalgia, unspecified: Secondary | ICD-10-CM

## 2014-07-30 DIAGNOSIS — G8929 Other chronic pain: Secondary | ICD-10-CM

## 2014-07-30 MED ORDER — ALBUTEROL SULFATE HFA 108 (90 BASE) MCG/ACT IN AERS
2.0000 | INHALATION_SPRAY | Freq: Two times a day (BID) | RESPIRATORY_TRACT | Status: DC
Start: 2014-07-30 — End: 2014-07-30

## 2014-07-30 MED ORDER — LIDOCAINE 5 % EX PTCH: 1.0000 | MEDICATED_PATCH | Freq: Two times a day (BID) | CUTANEOUS | Status: DC

## 2014-07-30 MED ORDER — CLONIDINE HCL 0.1 MG PO TABS: 0.0500 mg | ORAL_TABLET | Freq: Two times a day (BID) | ORAL | Status: DC

## 2014-07-30 MED ORDER — OXYCODONE HCL 5 MG PO TABS: 5.0000 mg | ORAL_TABLET | Freq: Two times a day (BID) | ORAL | Status: DC | PRN

## 2014-07-30 MED ORDER — ALBUTEROL SULFATE HFA 108 (90 BASE) MCG/ACT IN AERS: 2.0000 | INHALATION_SPRAY | Freq: Two times a day (BID) | RESPIRATORY_TRACT | Status: DC

## 2014-07-30 MED ORDER — OXYCODONE-ACETAMINOPHEN 5-325 MG PO TABS: 1.0000 | ORAL_TABLET | Freq: Three times a day (TID) | ORAL | Status: DC | PRN

## 2014-07-30 NOTE — Progress Notes (Signed)
Subjective:    Patient ID: Joann Ray, female    DOB: [birth year], 68 y.o.   MRN: [id number]  HPI  68 year old white female with history of fibromyalgia/chronic pain, hypothyroidism and depression for followup. At previous visit patient was experiencing significant stress reaction.  She was restarted on SSRI-citalopram 10 mg. Patient reports taking citalopram in the morning caused somnolence but somnolence improved when she switched to taking medication at bedtime.  She has been taking citalopram regularly for approximately 3 weeks. Her anxiety symptoms starting to improve.  Patient reports worsening of her fibromyalgia symptoms especially in her back and legs. She has a family member who is in the hospital for bypass surgery. She is helping to take care of her grandkids which is causing exacerbation of her musculoskeletal complaints.  Hypertension - stable  Review of Systems Chronic insomnia, weight is stable.    Past Medical History  Diagnosis Date  . Anxiety   . Asthma   . Depression   . Hypertension   . Hypothyroidism   . COPD (chronic obstructive pulmonary disease)   . Fibromyalgia   . Hyperlipidemia   . Arrhythmia      AVNRT  . Anemia   . Migraine     History   Social History  . Marital Status: Married    Spouse Name: Sammy    Number of Children: 3  . Years of Education: 12th/[employer]    Occupational History  . DISABLED    Social History Main Topics  . Smoking status: Former Smoker -- 0.50 packs/day for 40 years    Types: Cigarettes    Quit date: 11/29/2009  . Smokeless tobacco: Never Used     Comment: September 2011, again Oct 2013  . Alcohol Use: No  . Drug Use: No  . Sexual Activity: Not on file   Other Topics Concern  . Not on file   Social History Narrative   She has 46 year old granddaughter-Maggie   Diagnosed with juvenile arthritis   Pt lives at home with spouse.    Caffeine Use:2-3 cups daily.    Past Surgical History  Procedure  Laterality Date  . Appendectomy    . Abdominal hysterectomy    . Nasal sinus surgery  1993  . Cardiac electrophysiology mapping and ablation  08/28/2010    EPS/RFA of AVNRT  - Dr. Crissie Sickles  . Coronary stent placement  09/2012    Family History  Problem Relation Age of Onset  . Colon cancer Sister 86  . Diabetes Mother   . Stroke Mother   . Dementia Mother   . Skin cancer Father   . Breast cancer Paternal Aunt   . Stomach cancer Paternal Aunt   . Stroke Sister     Allergies  Allergen Reactions  . Penicillins Anaphylaxis and Hives    Current Outpatient Prescriptions on File Prior to Visit  Medication Sig Dispense Refill  . aspirin 81 MG chewable tablet Chew 81 mg by mouth daily.      Marland Kitchen atenolol (TENORMIN) 25 MG tablet Take 0.5 tablets (12.5 mg total) by mouth daily.  90 tablet  0  . citalopram (CELEXA) 10 MG tablet Take 1 tablet (10 mg total) by mouth daily.  30 tablet  3  . clonazePAM (KLONOPIN) 1 MG tablet Take 1 tablet (1 mg total) by mouth at bedtime.  30 tablet  3  . losartan-hydrochlorothiazide (HYZAAR) 100-12.5 MG per tablet Take 1 tablet by mouth daily.   90 tablet  1  .  pantoprazole (PROTONIX) 40 MG tablet Take 1 tablet (40 mg total) by mouth daily.  90 tablet  1  . pravastatin (PRAVACHOL) 20 MG tablet Take 1 tablet (20 mg total) by mouth daily.  90 tablet  1  . pregabalin (LYRICA) 25 MG capsule Take 1 capsule in the morning and 2 capsules qhs  90 capsule  3  . SYNTHROID 125 MCG tablet Take 1 tablet (125 mcg total) by mouth daily before breakfast.  90 tablet  1  . albuterol (PROVENTIL) (2.5 MG/3ML) 0.083% nebulizer solution Take 3 mLs (2.5 mg total) by nebulization 4 (four) times daily as needed for wheezing or shortness of breath.  30 mL  11  . ALPRAZolam (XANAX) 0.25 MG tablet TAKE ONE TABLET BY MOUTH TWICE DAILY AS NEEDED FOR ANXIETY  60 tablet  3   No current facility-administered medications on file prior to visit.    BP 125/66  Pulse 57  Temp(Src) 97.9 F  (36.6 C)  Wt 127 lb (57.607 kg)    Objective:   Physical Exam  Constitutional: She is oriented to person, place, and time. She appears well-developed and well-nourished. No distress.  HENT:  Head: Normocephalic and atraumatic.  Cardiovascular: Normal rate, regular rhythm and normal heart sounds.   No murmur heard. Pulmonary/Chest: Effort normal and breath sounds normal. She has no wheezes.  Neurological: She is alert and oriented to person, place, and time. No cranial nerve deficit.  Psychiatric: She has a normal mood and affect. Her behavior is normal.          Assessment & Plan:

## 2014-07-30 NOTE — Assessment & Plan Note (Signed)
Improved.  BP: 125/66 mmHg

## 2014-07-30 NOTE — Assessment & Plan Note (Signed)
Improved.  Continue same dose of citalopram.  Patient is taking medication at bedtime due to side effect of somnolence.

## 2014-07-30 NOTE — Assessment & Plan Note (Signed)
She is experiencing exacerbation due to increased physical activity.  Use oxycodone 5 mg bid as needed for breakthrough pain

## 2014-08-02 ENCOUNTER — Ambulatory Visit: Payer: Medicare PPO | Admitting: Internal Medicine

## 2014-08-09 ENCOUNTER — Other Ambulatory Visit: Payer: Self-pay | Admitting: Internal Medicine

## 2014-08-09 ENCOUNTER — Telehealth: Payer: Self-pay | Admitting: *Deleted

## 2014-08-09 MED ORDER — ALPRAZOLAM 0.25 MG PO TABS: ORAL_TABLET | ORAL | Status: DC

## 2014-08-09 NOTE — Telephone Encounter (Signed)
Ok to RF x 3 

## 2014-08-09 NOTE — Telephone Encounter (Signed)
rx called in to [facility]

## 2014-08-09 NOTE — Telephone Encounter (Signed)
Refill alprazolam 0.25 mg 1 bid prn for anxiety [facility]

## 2014-09-02 ENCOUNTER — Telehealth: Payer: Self-pay | Admitting: *Deleted

## 2014-09-02 MED ORDER — OXYCODONE-ACETAMINOPHEN 5-325 MG PO TABS: 1.0000 | ORAL_TABLET | Freq: Three times a day (TID) | ORAL | Status: DC | PRN

## 2014-09-02 NOTE — Telephone Encounter (Signed)
rx up front for p/u, pt aware

## 2014-09-02 NOTE — Telephone Encounter (Signed)
Dr Raliegh Ip will you sign for this?

## 2014-09-02 NOTE — Telephone Encounter (Signed)
Pt needs a refill on her percocet 5/325 mg.  Please advise

## 2014-09-02 NOTE — Telephone Encounter (Signed)
ok 

## 2014-09-02 NOTE — Telephone Encounter (Signed)
Ok for RF x 1 

## 2014-09-04 ENCOUNTER — Ambulatory Visit (INDEPENDENT_AMBULATORY_CARE_PROVIDER_SITE_OTHER): Payer: Medicare PPO | Admitting: *Deleted

## 2014-09-04 DIAGNOSIS — Z23 Encounter for immunization: Secondary | ICD-10-CM

## 2014-09-09 ENCOUNTER — Encounter: Payer: Medicare PPO | Admitting: Internal Medicine

## 2014-09-09 ENCOUNTER — Other Ambulatory Visit: Payer: Medicare PPO

## 2014-09-20 ENCOUNTER — Other Ambulatory Visit: Payer: Medicare PPO

## 2014-09-24 ENCOUNTER — Encounter: Payer: Medicare PPO | Admitting: Internal Medicine

## 2014-09-25 ENCOUNTER — Ambulatory Visit (INDEPENDENT_AMBULATORY_CARE_PROVIDER_SITE_OTHER): Payer: Medicare PPO | Admitting: Internal Medicine

## 2014-09-25 ENCOUNTER — Encounter: Payer: Self-pay | Admitting: Internal Medicine

## 2014-09-25 VITALS — BP 152/84 | HR 59 | Temp 98.4°F | Ht 62.0 in | Wt 122.0 lb

## 2014-09-25 DIAGNOSIS — Z23 Encounter for immunization: Secondary | ICD-10-CM

## 2014-09-25 DIAGNOSIS — Z0000 Encounter for general adult medical examination without abnormal findings: Secondary | ICD-10-CM

## 2014-09-25 MED ORDER — ATENOLOL 25 MG PO TABS: 12.5000 mg | ORAL_TABLET | Freq: Every day | ORAL | Status: DC

## 2014-09-25 MED ORDER — CLONAZEPAM 1 MG PO TABS: 1.0000 mg | ORAL_TABLET | Freq: Every day | ORAL | Status: DC

## 2014-09-25 MED ORDER — MORPHINE SULFATE ER 15 MG PO TBCR: 15.0000 mg | EXTENDED_RELEASE_TABLET | Freq: Two times a day (BID) | ORAL | Status: DC

## 2014-09-25 MED ORDER — CLONIDINE HCL 0.1 MG PO TABS: 0.0500 mg | ORAL_TABLET | Freq: Two times a day (BID) | ORAL | Status: DC

## 2014-09-25 MED ORDER — PANTOPRAZOLE SODIUM 40 MG PO TBEC: 40.0000 mg | DELAYED_RELEASE_TABLET | Freq: Every day | ORAL | Status: DC

## 2014-09-25 MED ORDER — CITALOPRAM HYDROBROMIDE 10 MG PO TABS: 10.0000 mg | ORAL_TABLET | Freq: Every day | ORAL | Status: DC

## 2014-09-25 MED ORDER — PREGABALIN 25 MG PO CAPS: ORAL_CAPSULE | ORAL | Status: DC

## 2014-09-25 MED ORDER — PRAVASTATIN SODIUM 20 MG PO TABS: 20.0000 mg | ORAL_TABLET | Freq: Every day | ORAL | Status: DC

## 2014-09-25 NOTE — Progress Notes (Signed)
Subjective:    Patient ID: Joann Ray, female    DOB: 09/09/1946, 68 y.o.   MRN: [id number]  HPI  68 year old white female for routine Medicare wellness exam. Medicare wall is questioned. Reviewed in detail. Patient has not had any hospitalizations or visits to the emergency room in the past year. Physician Roster reviewed.  Patient reports her last eye exam was 5-6 years ago.  She does not report any surgeries in the last year.  See attached Medicare wellness questionnaire for further details.  Review of Systems  NA    Past Medical History  Diagnosis Date  . Anxiety   . Asthma   . Depression   . Hypertension   . Hypothyroidism   . COPD (chronic obstructive pulmonary disease)   . Fibromyalgia   . Hyperlipidemia   . Arrhythmia      AVNRT  . Anemia   . Migraine     History   Social History  . Marital Status: Married    Spouse Name: Sammy    Number of Children: 3  . Years of Education: 12th/[employer]    Occupational History  . DISABLED    Social History Main Topics  . Smoking status: Former Smoker -- 0.50 packs/day for 40 years    Types: Cigarettes    Quit date: 11/29/2009  . Smokeless tobacco: Never Used     Comment: September 2011, again Oct 2013  . Alcohol Use: No  . Drug Use: No  . Sexual Activity: Not on file   Other Topics Concern  . Not on file   Social History Narrative   She has 45 year old granddaughter-Maggie   Diagnosed with juvenile arthritis   Pt lives at home with spouse.    Caffeine Use:2-3 cups daily.    Past Surgical History  Procedure Laterality Date  . Appendectomy    . Abdominal hysterectomy    . Nasal sinus surgery  1993  . Cardiac electrophysiology mapping and ablation  08/28/2010    EPS/RFA of AVNRT  - Dr. Crissie Sickles  . Coronary stent placement  09/2012    Family History  Problem Relation Age of Onset  . Colon cancer Sister 65  . Diabetes Mother   . Stroke Mother   . Dementia Mother   . Skin cancer Father     . Breast cancer Paternal Aunt   . Stomach cancer Paternal Aunt   . Stroke Sister     Allergies  Allergen Reactions  . Penicillins Anaphylaxis and Hives    Current Outpatient Prescriptions on File Prior to Visit  Medication Sig Dispense Refill  . albuterol (PROAIR HFA) 108 (90 BASE) MCG/ACT inhaler Inhale 2 puffs into the lungs 2 (two) times daily.  1 Inhaler  5  . albuterol (PROVENTIL) (2.5 MG/3ML) 0.083% nebulizer solution Take 3 mLs (2.5 mg total) by nebulization 4 (four) times daily as needed for wheezing or shortness of breath.  30 mL  11  . aspirin 81 MG chewable tablet Chew 81 mg by mouth daily.      Marland Kitchen losartan-hydrochlorothiazide (HYZAAR) 100-12.5 MG per tablet Take 1 tablet by mouth daily.   90 tablet  1  . SYNTHROID 125 MCG tablet Take 1 tablet (125 mcg total) by mouth daily before breakfast.  90 tablet  1   No current facility-administered medications on file prior to visit.    BP 152/84  Pulse 59  Temp(Src) 98.4 F (36.9 C) (Oral)  Ht 5\' 2"  (1.575 m)  Wt 122 lb (  55.339 kg)  BMI 22.31 kg/m2    Objective:   Physical Exam  Constitutional: She is oriented to person, place, and time. She appears well-developed and well-nourished. No distress.  HENT:  Head: Normocephalic and atraumatic.  Right Ear: External ear normal.  Left Ear: External ear normal.  Mouth/Throat: Oropharynx is clear and moist.  Eyes: Conjunctivae and EOM are normal. Pupils are equal, round, and reactive to light.  Neck: Neck supple.  Cardiovascular: Normal rate, regular rhythm and normal heart sounds.   No murmur heard. Pulmonary/Chest: Effort normal and breath sounds normal. She has no wheezes.  Abdominal: Soft. Bowel sounds are normal. There is no tenderness.  Musculoskeletal: She exhibits no edema.  Lymphadenopathy:    She has no cervical adenopathy.  Neurological: She is alert and oriented to person, place, and time. No cranial nerve deficit.  Skin: Skin is warm and dry.  Psychiatric:  She has a normal mood and affect. Her behavior is normal.          Assessment & Plan:

## 2014-09-25 NOTE — Progress Notes (Signed)
Pre visit review using our clinic review tool, if applicable. No additional management support is needed unless otherwise documented below in the visit note. 

## 2014-09-25 NOTE — Assessment & Plan Note (Signed)
68 year old white female evaluated for routine Medicare wellness exam. See attached Medicare wellness questionnaire form for details. Major items discussed with patient. She does not use any illicit drugs. She walks on a regular basis. Patient able to perform most activities of daily living. She has no history of falls. Her gait is normal. Her hearing is grossly normal. She does not have uncontrolled depressive symptoms. Abbreviated mini mental status exam is normal.  Patient updated with Pneumovax.  Continue regular exercise.

## 2014-10-23 ENCOUNTER — Telehealth: Payer: Self-pay | Admitting: Internal Medicine

## 2014-10-23 NOTE — Telephone Encounter (Signed)
Jarrett Soho from [facility] called to ask if they can refill the following rx 2 days early  morphine (MS CONTIN) 15 MG 12 hr tablet    [facility] [place]

## 2014-10-23 NOTE — Telephone Encounter (Signed)
Yes, ok to RF 2 days early

## 2014-10-23 NOTE — Telephone Encounter (Signed)
Spoke to [employer] at [facility] and gave ok to refill early

## 2014-11-07 ENCOUNTER — Encounter (HOSPITAL_COMMUNITY): Payer: Self-pay | Admitting: Cardiovascular Disease

## 2014-11-15 ENCOUNTER — Encounter: Payer: Self-pay | Admitting: Internal Medicine

## 2014-11-15 ENCOUNTER — Ambulatory Visit (INDEPENDENT_AMBULATORY_CARE_PROVIDER_SITE_OTHER): Payer: Medicare PPO | Admitting: Internal Medicine

## 2014-11-15 VITALS — BP 134/80 | HR 72 | Temp 98.1°F | Ht 62.0 in | Wt 117.0 lb

## 2014-11-15 DIAGNOSIS — E785 Hyperlipidemia, unspecified: Secondary | ICD-10-CM

## 2014-11-15 DIAGNOSIS — M8580 Other specified disorders of bone density and structure, unspecified site: Secondary | ICD-10-CM

## 2014-11-15 DIAGNOSIS — M797 Fibromyalgia: Secondary | ICD-10-CM

## 2014-11-15 DIAGNOSIS — I10 Essential (primary) hypertension: Secondary | ICD-10-CM

## 2014-11-15 DIAGNOSIS — E039 Hypothyroidism, unspecified: Secondary | ICD-10-CM

## 2014-11-15 DIAGNOSIS — F341 Dysthymic disorder: Secondary | ICD-10-CM

## 2014-11-15 DIAGNOSIS — G4700 Insomnia, unspecified: Secondary | ICD-10-CM

## 2014-11-15 MED ORDER — CLONAZEPAM 0.5 MG PO TABS: ORAL_TABLET | ORAL | Status: DC

## 2014-11-15 MED ORDER — VITAMIN D 50 MCG (2000 UT) PO CAPS: 2000.0000 [IU] | ORAL_CAPSULE | Freq: Every day | ORAL | Status: DC

## 2014-11-15 MED ORDER — MORPHINE SULFATE ER 15 MG PO TBCR: 15.0000 mg | EXTENDED_RELEASE_TABLET | Freq: Two times a day (BID) | ORAL | Status: DC

## 2014-11-15 MED ORDER — ACETAMINOPHEN 500 MG PO TABS: 500.0000 mg | ORAL_TABLET | Freq: Two times a day (BID) | ORAL | Status: DC | PRN

## 2014-11-15 MED ORDER — SERTRALINE HCL 25 MG PO TABS: 25.0000 mg | ORAL_TABLET | Freq: Every day | ORAL | Status: DC

## 2014-11-15 MED ORDER — LOSARTAN POTASSIUM-HCTZ 100-12.5 MG PO TABS: 1.0000 | ORAL_TABLET | Freq: Every day | ORAL | Status: DC

## 2014-11-15 NOTE — Assessment & Plan Note (Signed)
Patient restarted on SSRI. Citalopram causing somnolence. Switch to sertraline 25 mg once daily.  Patient tried Cymbalta in the past but was unable to tolerate.

## 2014-11-15 NOTE — Assessment & Plan Note (Signed)
Patient experiencing daytime fatigue. Patient advised to try to taper clonazepam dose to 0.5 mg at bedtime as needed.

## 2014-11-15 NOTE — Progress Notes (Signed)
Subjective:    Patient ID: Joann Ray, female    DOB: [birth year], 68 y.o.   MRN: [id number]  HPI  68 year old white female with history of chronic fibromyalgia/pain syndrome, depression and hypertension for follow-up. At previous visit patient's Percocet discontinued. Patient now taking MS Contin 15 mg twice daily. Patient reports some increase in her musculoskeletal pain. This may be due to increase in activity over the holidays. Several family members visiting.  Patient stopped her AM dose of Lyrica due to fatigue.  Depression - patient complains citalopram even at 5 mg doses causing somnolence. Patient also takes clonazepam 0.5-1 mg at bedtime as needed.  Hypertension-stable  Review of Systems Weight is stable.      Past Medical History  Diagnosis Date  . Anxiety   . Asthma   . Depression   . Hypertension   . Hypothyroidism   . COPD (chronic obstructive pulmonary disease)   . Fibromyalgia   . Hyperlipidemia   . Arrhythmia      AVNRT  . Anemia   . Migraine     History   Social History  . Marital Status: Married    Spouse Name: Sammy    Number of Children: 3  . Years of Education: 12th/[employer]    Occupational History  . DISABLED    Social History Main Topics  . Smoking status: Former Smoker -- 0.50 packs/day for 40 years    Types: Cigarettes    Quit date: 11/29/2009  . Smokeless tobacco: Never Used     Comment: September 2011, again Oct 2013  . Alcohol Use: No  . Drug Use: No  . Sexual Activity: Not on file   Other Topics Concern  . Not on file   Social History Narrative   She has 43 year old granddaughter-Maggie   Diagnosed with juvenile arthritis   Pt lives at home with spouse.    Caffeine Use:2-3 cups daily.    Past Surgical History  Procedure Laterality Date  . Appendectomy    . Abdominal hysterectomy    . Nasal sinus surgery  1993  . Cardiac electrophysiology mapping and ablation  08/28/2010    EPS/RFA of AVNRT  - Dr. Crissie Sickles  .  Coronary stent placement  09/2012  . Unilateral upper extremeity angiogram Left 10/18/2012    Procedure: UNILATERAL UPPER EXTREMEITY ANGIOGRAM;  Surgeon: Sherren Mocha, MD;  Location: [facility] CATH LAB;  Service: Cardiovascular;  Laterality: Left;  . Percutaneous stent intervention Left 10/18/2012    Procedure: PERCUTANEOUS STENT INTERVENTION;  Surgeon: Sherren Mocha, MD;  Location: [facility] CATH LAB;  Service: Cardiovascular;  Laterality: Left;    Family History  Problem Relation Age of Onset  . Colon cancer Sister 50  . Diabetes Mother   . Stroke Mother   . Dementia Mother   . Skin cancer Father   . Breast cancer Paternal Aunt   . Stomach cancer Paternal Aunt   . Stroke Sister     Allergies  Allergen Reactions  . Penicillins Anaphylaxis and Hives    Current Outpatient Prescriptions on File Prior to Visit  Medication Sig Dispense Refill  . albuterol (PROAIR HFA) 108 (90 BASE) MCG/ACT inhaler Inhale 2 puffs into the lungs 2 (two) times daily. 1 Inhaler 5  . albuterol (PROVENTIL) (2.5 MG/3ML) 0.083% nebulizer solution Take 3 mLs (2.5 mg total) by nebulization 4 (four) times daily as needed for wheezing or shortness of breath. 30 mL 11  . aspirin 81 MG chewable tablet Chew 81 mg by mouth  daily.    . atenolol (TENORMIN) 25 MG tablet Take 0.5 tablets (12.5 mg total) by mouth daily. 90 tablet 0  . citalopram (CELEXA) 10 MG tablet Take 1 tablet (10 mg total) by mouth daily. 90 tablet 1  . clonazePAM (KLONOPIN) 1 MG tablet Take 1 tablet (1 mg total) by mouth at bedtime. 30 tablet 3  . cloNIDine (CATAPRES) 0.1 MG tablet Take 0.5 tablets (0.05 mg total) by mouth 2 (two) times daily. 180 tablet 1  . losartan-hydrochlorothiazide (HYZAAR) 100-12.5 MG per tablet Take 1 tablet by mouth daily.  90 tablet 1  . morphine (MS CONTIN) 15 MG 12 hr tablet Take 1 tablet (15 mg total) by mouth every 12 (twelve) hours. 60 tablet 0  . pantoprazole (PROTONIX) 40 MG tablet Take 1 tablet (40 mg total) by mouth daily. 90  tablet 1  . pravastatin (PRAVACHOL) 20 MG tablet Take 1 tablet (20 mg total) by mouth daily. 90 tablet 1  . pregabalin (LYRICA) 25 MG capsule Take 1 capsule in the morning and 2 capsules qhs 90 capsule 5  . SYNTHROID 125 MCG tablet Take 1 tablet (125 mcg total) by mouth daily before breakfast. 90 tablet 1   No current facility-administered medications on file prior to visit.    BP 134/80 mmHg  Pulse 72  Temp(Src) 98.1 F (36.7 C) (Oral)  Ht 5\' 2"  (1.575 m)  Wt 117 lb (53.071 kg)  BMI 21.39 kg/m2    Objective:   Physical Exam  Constitutional: She is oriented to person, place, and time. She appears well-developed and well-nourished. No distress.  HENT:  Head: Normocephalic and atraumatic.  Cardiovascular: Normal rate, regular rhythm and normal heart sounds.   No murmur heard. Pulmonary/Chest: Effort normal and breath sounds normal. She has no wheezes.  Musculoskeletal: She exhibits no edema.  Neurological: She is alert and oriented to person, place, and time. No cranial nerve deficit.  Psychiatric: She has a normal mood and affect. Her behavior is normal.          Assessment & Plan:

## 2014-11-15 NOTE — Assessment & Plan Note (Addendum)
Patient has long history of fibromyalgia. 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 Patient switched from Percocet to MS Contin 15 mg twice daily. She is having slight exacerbation of her symptoms. I suspect this is secondary to increase in activity over the holidays. Add acetaminophen 500 mg twice daily.  She was referred to pain management in the past. Patient unable to taper off narcotics.

## 2014-11-15 NOTE — Assessment & Plan Note (Signed)
Stable.  No change in medication.

## 2014-11-15 NOTE — Progress Notes (Signed)
Pre visit review using our clinic review tool, if applicable. No additional management support is needed unless otherwise documented below in the visit note. 

## 2014-11-15 NOTE — Assessment & Plan Note (Signed)
Monitor TFTs

## 2014-11-25 ENCOUNTER — Ambulatory Visit: Payer: Medicare PPO | Admitting: Internal Medicine

## 2014-12-09 ENCOUNTER — Telehealth: Payer: Self-pay | Admitting: *Deleted

## 2014-12-09 MED ORDER — PREGABALIN 50 MG PO CAPS: 50.0000 mg | ORAL_CAPSULE | Freq: Two times a day (BID) | ORAL | Status: DC

## 2014-12-09 NOTE — Telephone Encounter (Signed)
rx called in to [place].  I cancelled all the remaining rx's on the Lyrica 25mg .  Pt aware of Dr Lora Havens instructions

## 2014-12-09 NOTE — Telephone Encounter (Signed)
Ok for rx for Lyrica 50 mg # 60 one cap bid.  RF x2.  DC zoloft.

## 2014-12-09 NOTE — Telephone Encounter (Signed)
Pt states that the zoloft does not work.  It makes her nervous so she stop taking it.  She would like to know if Dr Shawna Orleans would consider letting her take the Lyrica 50 mg in the morning and 50 mg at night.  She states that it works better and does not make her tired.  Please advise

## 2014-12-09 NOTE — Telephone Encounter (Signed)
Pt called back to say that she is only taking 1 pill of the morphine since she doubled the dose of the lyrica.  If Dr Shawna Orleans increases the Lyrica she said she may be able to come off the morphine.  Please advise if that's okay

## 2015-01-09 ENCOUNTER — Other Ambulatory Visit (INDEPENDENT_AMBULATORY_CARE_PROVIDER_SITE_OTHER): Payer: Medicare Other

## 2015-01-09 DIAGNOSIS — M8580 Other specified disorders of bone density and structure, unspecified site: Secondary | ICD-10-CM | POA: Diagnosis not present

## 2015-01-09 DIAGNOSIS — I10 Essential (primary) hypertension: Secondary | ICD-10-CM

## 2015-01-09 DIAGNOSIS — E559 Vitamin D deficiency, unspecified: Secondary | ICD-10-CM | POA: Diagnosis not present

## 2015-01-09 DIAGNOSIS — E785 Hyperlipidemia, unspecified: Secondary | ICD-10-CM

## 2015-01-09 DIAGNOSIS — E039 Hypothyroidism, unspecified: Secondary | ICD-10-CM

## 2015-01-09 LAB — LIPID PANEL
CHOL/HDL RATIO: 4
Cholesterol: 245 mg/dL — ABNORMAL HIGH (ref 0–200)
HDL: 56.5 mg/dL (ref >39.00)
LDL CALC: 169 mg/dL — AB (ref 0–99)
NONHDL: 188.5
TRIGLYCERIDES: 100 mg/dL (ref 0.0–149.0)
VLDL: 20 mg/dL (ref 0.0–40.0)

## 2015-01-09 LAB — BASIC METABOLIC PANEL
BUN: 9 mg/dL (ref 6–23)
CALCIUM: 9 mg/dL (ref 8.4–10.5)
CHLORIDE: 100 meq/L (ref 96–112)
CO2: 27 meq/L (ref 19–32)
Creatinine, Ser: 0.77 mg/dL (ref 0.40–1.20)
GFR: 79.61 mL/min (ref >60.00)
Glucose, Bld: 77 mg/dL (ref 70–99)
Potassium: 5 mEq/L (ref 3.5–5.1)
SODIUM: 133 meq/L — AB (ref 135–145)

## 2015-01-09 LAB — CBC WITH DIFFERENTIAL/PLATELET
BASOS PCT: 0.9 % (ref 0.0–3.0)
Basophils Absolute: 0.1 10*3/uL (ref 0.0–0.1)
EOS ABS: 0.1 10*3/uL (ref 0.0–0.7)
EOS PCT: 1.9 % (ref 0.0–5.0)
HEMATOCRIT: 36.1 % (ref 36.0–46.0)
Hemoglobin: 12 g/dL (ref 12.0–15.0)
Lymphocytes Relative: 37.2 % (ref 12.0–46.0)
Lymphs Abs: 2.8 10*3/uL (ref 0.7–4.0)
MCHC: 33.3 g/dL (ref 30.0–36.0)
MCV: 92.4 fl (ref 78.0–100.0)
MONO ABS: 0.6 10*3/uL (ref 0.1–1.0)
Monocytes Relative: 8.4 % (ref 3.0–12.0)
Neutro Abs: 3.8 10*3/uL (ref 1.4–7.7)
Neutrophils Relative %: 51.6 % (ref 43.0–77.0)
PLATELETS: 279 10*3/uL (ref 150.0–400.0)
RBC: 3.91 Mil/uL (ref 3.87–5.11)
RDW: 14.8 % (ref 11.5–15.5)
WBC: 7.4 10*3/uL (ref 4.0–10.5)

## 2015-01-09 LAB — HEPATIC FUNCTION PANEL
ALT: 11 U/L (ref 0–35)
AST: 16 U/L (ref 0–37)
Albumin: 4.1 g/dL (ref 3.5–5.2)
Alkaline Phosphatase: 70 U/L (ref 39–117)
Bilirubin, Direct: 0.1 mg/dL (ref 0.0–0.3)
TOTAL PROTEIN: 6.6 g/dL (ref 6.0–8.3)
Total Bilirubin: 0.5 mg/dL (ref 0.2–1.2)

## 2015-01-09 LAB — VITAMIN D 25 HYDROXY (VIT D DEFICIENCY, FRACTURES): VITD: 13.78 ng/mL — ABNORMAL LOW (ref 30.00–100.00)

## 2015-01-09 LAB — TSH: TSH: 15.06 u[IU]/mL — AB (ref 0.35–4.50)

## 2015-01-09 MED ORDER — VITAMIN D (ERGOCALCIFEROL) 1.25 MG (50000 UNIT) PO CAPS: 50000.0000 [IU] | ORAL_CAPSULE | ORAL | Status: DC

## 2015-01-15 ENCOUNTER — Ambulatory Visit (INDEPENDENT_AMBULATORY_CARE_PROVIDER_SITE_OTHER): Payer: Medicare Other | Admitting: Internal Medicine

## 2015-01-15 ENCOUNTER — Encounter: Payer: Self-pay | Admitting: Internal Medicine

## 2015-01-15 VITALS — BP 126/74 | HR 58 | Temp 98.5°F | Ht 62.0 in | Wt 118.0 lb

## 2015-01-15 DIAGNOSIS — I739 Peripheral vascular disease, unspecified: Secondary | ICD-10-CM | POA: Diagnosis not present

## 2015-01-15 DIAGNOSIS — E559 Vitamin D deficiency, unspecified: Secondary | ICD-10-CM | POA: Insufficient documentation

## 2015-01-15 DIAGNOSIS — E039 Hypothyroidism, unspecified: Secondary | ICD-10-CM

## 2015-01-15 DIAGNOSIS — M797 Fibromyalgia: Secondary | ICD-10-CM | POA: Diagnosis not present

## 2015-01-15 MED ORDER — PREGABALIN 75 MG PO CAPS
75.0000 mg | ORAL_CAPSULE | Freq: Every day | ORAL | Status: DC
Start: 2015-01-15 — End: 2015-03-14

## 2015-01-15 MED ORDER — MORPHINE SULFATE ER 15 MG PO TBCR: 15.0000 mg | EXTENDED_RELEASE_TABLET | Freq: Two times a day (BID) | ORAL | Status: DC

## 2015-01-15 MED ORDER — OXYCODONE HCL 5 MG PO TABA: 5.0000 mg | ORAL_TABLET | Freq: Every day | ORAL | Status: DC | PRN

## 2015-01-15 NOTE — Assessment & Plan Note (Addendum)
Vitamin D level very low.  Treat with vitamin D 50,000 units once weekly for 12 weeks.

## 2015-01-15 NOTE — Assessment & Plan Note (Signed)
Patient's TSH is elevated. She has been taking her Synthroid with antacids. Patient advised to take thyroid medication on an empty stomach. Repeat TSH in 2 months.

## 2015-01-15 NOTE — Progress Notes (Signed)
Subjective:    Patient ID: Joann Ray, female    DOB: 1946/11/29, 69 y.o.   MRN: [id number]  HPI  69 year old white female with history of chronic fibromyalgia/pain syndrome, depression and hypothyroidism for follow-up. Patient reports increase in her musculoskeletal pain since medication change. Patient has been taking Lyrica 50 mg bedtime then added Lyrica 50 mg in the morning. Her musculoskeletal complaints are worse in the morning. She also complains of morning fatigue.  Sleep quality is poor.  Her musculoskeletal complaints are worse in the evening.  Hypothyroidism - Patient's TSH is elevated. She reports taking her Synthroid on a regular basis. She has been taking her medications with antacids in the morning.  Patient's recent blood work reviewed. Her vitamin D level is very low at 13.   Review of Systems Labile blood pressure, she also complains of intermittent "roaring" sensation in her ears    Past Medical History  Diagnosis Date  . Anxiety   . Asthma   . Depression   . Hypertension   . Hypothyroidism   . COPD (chronic obstructive pulmonary disease)   . Fibromyalgia   . Hyperlipidemia   . Arrhythmia      AVNRT  . Anemia   . Migraine     History   Social History  . Marital Status: Married    Spouse Name: Sammy  . Number of Children: 3  . Years of Education: 12th/[employer]    Occupational History  . DISABLED    Social History Main Topics  . Smoking status: Former Smoker -- 0.50 packs/day for 40 years    Types: Cigarettes    Quit date: 11/29/2009  . Smokeless tobacco: Never Used     Comment: September 2011, again Oct 2013  . Alcohol Use: No  . Drug Use: No  . Sexual Activity: Not on file   Other Topics Concern  . Not on file   Social History Narrative   She has 29 year old granddaughter-Maggie   Diagnosed with juvenile arthritis   Pt lives at home with spouse.    Caffeine Use:2-3 cups daily.    Past Surgical History  Procedure Laterality  Date  . Appendectomy    . Abdominal hysterectomy    . Nasal sinus surgery  1993  . Cardiac electrophysiology mapping and ablation  08/28/2010    EPS/RFA of AVNRT  - Dr. Crissie Sickles  . Coronary stent placement  09/2012  . Unilateral upper extremeity angiogram Left 10/18/2012    Procedure: UNILATERAL UPPER EXTREMEITY ANGIOGRAM;  Surgeon: Sherren Mocha, MD;  Location: [facility] CATH LAB;  Service: Cardiovascular;  Laterality: Left;  . Percutaneous stent intervention Left 10/18/2012    Procedure: PERCUTANEOUS STENT INTERVENTION;  Surgeon: Sherren Mocha, MD;  Location: [facility] CATH LAB;  Service: Cardiovascular;  Laterality: Left;    Family History  Problem Relation Age of Onset  . Colon cancer Sister 25  . Diabetes Mother   . Stroke Mother   . Dementia Mother   . Skin cancer Father   . Breast cancer Paternal Aunt   . Stomach cancer Paternal Aunt   . Stroke Sister     Allergies  Allergen Reactions  . Penicillins Anaphylaxis and Hives    Current Outpatient Prescriptions on File Prior to Visit  Medication Sig Dispense Refill  . acetaminophen (TYLENOL) 500 MG tablet Take 1 tablet (500 mg total) by mouth every 12 (twelve) hours as needed. 30 tablet 0  . albuterol (PROAIR HFA) 108 (90 BASE) MCG/ACT inhaler Inhale 2 puffs  into the lungs 2 (two) times daily. 1 Inhaler 5  . albuterol (PROVENTIL) (2.5 MG/3ML) 0.083% nebulizer solution Take 3 mLs (2.5 mg total) by nebulization 4 (four) times daily as needed for wheezing or shortness of breath. 30 mL 11  . aspirin 81 MG chewable tablet Chew 81 mg by mouth daily.    Marland Kitchen atenolol (TENORMIN) 25 MG tablet Take 0.5 tablets (12.5 mg total) by mouth daily. 90 tablet 0  . Cholecalciferol (VITAMIN D) 2000 UNITS CAPS Take 1 capsule (2,000 Units total) by mouth daily. 30 capsule   . clonazePAM (KLONOPIN) 0.5 MG tablet Take 1-2 tabs at bedtime as needed 60 tablet 3  . cloNIDine (CATAPRES) 0.1 MG tablet Take 0.5 tablets (0.05 mg total) by mouth 2 (two) times daily. 180  tablet 1  . losartan-hydrochlorothiazide (HYZAAR) 100-12.5 MG per tablet Take 1 tablet by mouth daily. 90 tablet 1  . pantoprazole (PROTONIX) 40 MG tablet Take 1 tablet (40 mg total) by mouth daily. 90 tablet 1  . pravastatin (PRAVACHOL) 20 MG tablet Take 1 tablet (20 mg total) by mouth daily. 90 tablet 1  . sertraline (ZOLOFT) 25 MG tablet Take 1 tablet (25 mg total) by mouth daily. 30 tablet 3  . SYNTHROID 125 MCG tablet Take 1 tablet (125 mcg total) by mouth daily before breakfast. 90 tablet 1  . Vitamin D, Ergocalciferol, (DRISDOL) 50000 UNITS CAPS capsule Take 1 capsule (50,000 Units total) by mouth every 7 (seven) days. 12 capsule 0   No current facility-administered medications on file prior to visit.    BP 126/74 mmHg  Pulse 58  Temp(Src) 98.5 F (36.9 C) (Oral)  Ht 5\' 2"  (1.575 m)  Wt 118 lb (53.524 kg)  BMI 21.58 kg/m2    Objective:   Physical Exam  Constitutional: She is oriented to person, place, and time. She appears well-developed and well-nourished. No distress.  HENT:  Head: Normocephalic and atraumatic.  Cardiovascular: Normal rate, regular rhythm and normal heart sounds.   No murmur heard. Pulmonary/Chest: Effort normal and breath sounds normal. She has no wheezes.  Musculoskeletal: She exhibits no edema.  Neurological: She is alert and oriented to person, place, and time. No cranial nerve deficit.  Skin: Skin is warm and dry.  Psychiatric: She has a normal mood and affect. Her behavior is normal.          Assessment & Plan:

## 2015-01-15 NOTE — Assessment & Plan Note (Signed)
I would like to avoid restarting short acting narcotics. Continue same dose MS Contin.  Add oxycodone 5 mg qd prn for breakthrough pain.    She has chronic back pain in addition to fibromyalgia.

## 2015-01-15 NOTE — Assessment & Plan Note (Addendum)
Patient complains of roaring sensation in her ears.  Obtain carotid doppler.  Continue Pravastatin.  Patient advised to try OTC CoQ10 / Ubiquinol.

## 2015-01-15 NOTE — Progress Notes (Signed)
Pre visit review using our clinic review tool, if applicable. No additional management support is needed unless otherwise documented below in the visit note. 

## 2015-01-19 ENCOUNTER — Other Ambulatory Visit: Payer: Self-pay | Admitting: Internal Medicine

## 2015-01-23 ENCOUNTER — Encounter (HOSPITAL_COMMUNITY): Payer: Medicare Other

## 2015-01-24 ENCOUNTER — Telehealth: Payer: Self-pay

## 2015-01-24 MED ORDER — ALBUTEROL SULFATE (2.5 MG/3ML) 0.083% IN NEBU: 2.5000 mg | INHALATION_SOLUTION | Freq: Four times a day (QID) | RESPIRATORY_TRACT | Status: DC | PRN

## 2015-01-24 MED ORDER — SYNTHROID 125 MCG PO TABS: 125.0000 ug | ORAL_TABLET | Freq: Every day | ORAL | Status: DC

## 2015-01-24 NOTE — Telephone Encounter (Signed)
Pt called requesting a refill of Synthroid 125 mcg and albuterol nebulizer solution.  Pt would like both prescriptions sent to [facility].

## 2015-01-24 NOTE — Telephone Encounter (Signed)
rx sent in electronically 

## 2015-02-10 ENCOUNTER — Telehealth: Payer: Self-pay | Admitting: Internal Medicine

## 2015-02-10 NOTE — Telephone Encounter (Signed)
Pt did not want to see no one but Dr Joann Ray so I put her on next Mon 02/16/14 at 1:15 he is the doc of the day. Is this ok

## 2015-02-10 NOTE — Telephone Encounter (Signed)
That is fine.  I tried to work pt in today but Dr Shawna Orleans is out.  I called her back and she will wait till next week.  Nothing further needed

## 2015-02-17 ENCOUNTER — Ambulatory Visit (INDEPENDENT_AMBULATORY_CARE_PROVIDER_SITE_OTHER): Payer: Medicare Other | Admitting: Internal Medicine

## 2015-02-17 ENCOUNTER — Encounter: Payer: Self-pay | Admitting: Internal Medicine

## 2015-02-17 VITALS — BP 112/80 | Temp 97.7°F | Ht 62.0 in | Wt 121.0 lb

## 2015-02-17 DIAGNOSIS — R51 Headache: Secondary | ICD-10-CM | POA: Diagnosis not present

## 2015-02-17 DIAGNOSIS — M797 Fibromyalgia: Secondary | ICD-10-CM | POA: Diagnosis not present

## 2015-02-17 DIAGNOSIS — R519 Headache, unspecified: Secondary | ICD-10-CM

## 2015-02-17 LAB — BASIC METABOLIC PANEL
BUN: 10 mg/dL (ref 6–23)
CALCIUM: 8.8 mg/dL (ref 8.4–10.5)
CHLORIDE: 99 meq/L (ref 96–112)
CO2: 25 mEq/L (ref 19–32)
Creatinine, Ser: 0.66 mg/dL (ref 0.40–1.20)
GFR: 95.08 mL/min (ref >60.00)
Glucose, Bld: 69 mg/dL — ABNORMAL LOW (ref 70–99)
Potassium: 3.8 mEq/L (ref 3.5–5.1)
Sodium: 134 mEq/L — ABNORMAL LOW (ref 135–145)

## 2015-02-17 LAB — C-REACTIVE PROTEIN: CRP: 0.4 mg/dL — ABNORMAL LOW (ref 0.5–20.0)

## 2015-02-17 LAB — SEDIMENTATION RATE: SED RATE: 17 mm/h (ref 0–22)

## 2015-02-17 MED ORDER — PROMETHAZINE HCL 25 MG/ML IJ SOLN: 25.0000 mg | Freq: Once | INTRAMUSCULAR | Status: DC

## 2015-02-17 MED ORDER — NORTRIPTYLINE HCL 10 MG PO CAPS: 10.0000 mg | ORAL_CAPSULE | Freq: Every day | ORAL | Status: DC

## 2015-02-17 MED ORDER — PROMETHAZINE HCL 50 MG/ML IJ SOLN
50.0000 mg | Freq: Once | INTRAMUSCULAR | Status: AC
  Administered 2015-02-17: 25 mg via INTRAMUSCULAR

## 2015-02-17 NOTE — Assessment & Plan Note (Signed)
69 year old white female complains of worsening headache. She describes a "buzzing sensation in her head". Her symptoms exacerbated by loud noises and associated with nausea. Trial of nortriptyline 10 mg at bedtime. Obtain MRI of brain. She has mild temporal tenderness. I doubt temporal arthritis. Obtain sedimentation rate and CRP.

## 2015-02-17 NOTE — Progress Notes (Signed)
Subjective:    Patient ID: Joann Ray, female    DOB: 1946-04-04, 68 y.o.   MRN: [id number]  HPI  69 year old white female with history of fibromyalgia, hypertension, and depression for follow up.  Patient reports possible history of silent migraine. This was as per her neurologist at [facility] 14 years ago. She describes intermittent "buzzing sensation in her head". Her symptoms can be treated by certain foods and also loud noises. Patient has associated nausea.  Fibromyalgia - she reports using less morphine sulfate and just using oxycodone.   Review of Systems Negative for visual changes, no focal weakness    Past Medical History  Diagnosis Date  . Anxiety   . Asthma   . Depression   . Hypertension   . Hypothyroidism   . COPD (chronic obstructive pulmonary disease)   . Fibromyalgia   . Hyperlipidemia   . Arrhythmia      AVNRT  . Anemia   . Migraine     History   Social History  . Marital Status: Married    Spouse Name: Sammy  . Number of Children: 3  . Years of Education: 12th/[employer]    Occupational History  . DISABLED    Social History Main Topics  . Smoking status: Former Smoker -- 0.50 packs/day for 40 years    Types: Cigarettes    Quit date: 11/29/2009  . Smokeless tobacco: Never Used     Comment: September 2011, again Oct 2013  . Alcohol Use: No  . Drug Use: No  . Sexual Activity: Not on file   Other Topics Concern  . Not on file   Social History Narrative   She has 60 year old granddaughter-Maggie   Diagnosed with juvenile arthritis   Pt lives at home with spouse.    Caffeine Use:2-3 cups daily.    Past Surgical History  Procedure Laterality Date  . Appendectomy    . Abdominal hysterectomy    . Nasal sinus surgery  1993  . Cardiac electrophysiology mapping and ablation  08/28/2010    EPS/RFA of AVNRT  - Dr. Crissie Sickles  . Coronary stent placement  09/2012  . Unilateral upper extremeity angiogram Left 10/18/2012    Procedure:  UNILATERAL UPPER EXTREMEITY ANGIOGRAM;  Surgeon: Sherren Mocha, MD;  Location: [facility] CATH LAB;  Service: Cardiovascular;  Laterality: Left;  . Percutaneous stent intervention Left 10/18/2012    Procedure: PERCUTANEOUS STENT INTERVENTION;  Surgeon: Sherren Mocha, MD;  Location: [facility] CATH LAB;  Service: Cardiovascular;  Laterality: Left;    Family History  Problem Relation Age of Onset  . Colon cancer Sister 4  . Diabetes Mother   . Stroke Mother   . Dementia Mother   . Skin cancer Father   . Breast cancer Paternal Aunt   . Stomach cancer Paternal Aunt   . Stroke Sister     Allergies  Allergen Reactions  . Penicillins Anaphylaxis and Hives    Current Outpatient Prescriptions on File Prior to Visit  Medication Sig Dispense Refill  . acetaminophen (TYLENOL) 500 MG tablet Take 1 tablet (500 mg total) by mouth every 12 (twelve) hours as needed. 30 tablet 0  . albuterol (PROAIR HFA) 108 (90 BASE) MCG/ACT inhaler Inhale 2 puffs into the lungs 2 (two) times daily. 1 Inhaler 5  . albuterol (PROVENTIL) (2.5 MG/3ML) 0.083% nebulizer solution Take 3 mLs (2.5 mg total) by nebulization 4 (four) times daily as needed for wheezing or shortness of breath. 30 mL 11  . aspirin 81 MG  chewable tablet Chew 81 mg by mouth daily.    Marland Kitchen atenolol (TENORMIN) 25 MG tablet Take 0.5 tablets (12.5 mg total) by mouth daily. 90 tablet 0  . Cholecalciferol (VITAMIN D) 2000 UNITS CAPS Take 1 capsule (2,000 Units total) by mouth daily. 30 capsule   . clonazePAM (KLONOPIN) 0.5 MG tablet Take 1-2 tabs at bedtime as needed 60 tablet 3  . cloNIDine (CATAPRES) 0.1 MG tablet Take 0.5 tablets (0.05 mg total) by mouth 2 (two) times daily. 180 tablet 1  . losartan-hydrochlorothiazide (HYZAAR) 100-12.5 MG per tablet Take 1 tablet by mouth daily. 90 tablet 1  . morphine (MS CONTIN) 15 MG 12 hr tablet Take 1 tablet (15 mg total) by mouth every 12 (twelve) hours. 60 tablet 0  . ondansetron (ZOFRAN) 4 MG tablet TAKE 1 TABLET BY MOUTH  EVERY 6 HOURS AS NEEDED FOR NAUSEA AND VOMITING 30 tablet 1  . OxyCODONE HCl, Abuse Deter, 5 MG TABA Take 5 mg by mouth daily as needed (Use for breakthrough pain). 30 tablet 0  . pantoprazole (PROTONIX) 40 MG tablet Take 1 tablet (40 mg total) by mouth daily. 90 tablet 1  . pravastatin (PRAVACHOL) 20 MG tablet Take 1 tablet (20 mg total) by mouth daily. 90 tablet 1  . pregabalin (LYRICA) 75 MG capsule Take 1 capsule (75 mg total) by mouth at bedtime. 30 capsule 2  . sertraline (ZOLOFT) 25 MG tablet Take 1 tablet (25 mg total) by mouth daily. 30 tablet 3  . SYNTHROID 125 MCG tablet Take 1 tablet (125 mcg total) by mouth daily before breakfast. 90 tablet 1  . Vitamin D, Ergocalciferol, (DRISDOL) 50000 UNITS CAPS capsule Take 1 capsule (50,000 Units total) by mouth every 7 (seven) days. 12 capsule 0   No current facility-administered medications on file prior to visit.    BP 112/80 mmHg  Temp(Src) 97.7 F (36.5 C) (Oral)  Ht 5\' 2"  (1.575 m)  Wt 121 lb (54.885 kg)  BMI 22.13 kg/m2    Objective:   Physical Exam  Constitutional: She appears well-developed and well-nourished. No distress.  Cardiovascular: Normal rate, regular rhythm and normal heart sounds.   No murmur heard. Pulmonary/Chest: Effort normal and breath sounds normal. She has no wheezes.  Musculoskeletal: She exhibits no edema.  Neurological: She is alert. No cranial nerve deficit.  Psychiatric: She has a normal mood and affect. Her behavior is normal.          Assessment & Plan:

## 2015-02-17 NOTE — Progress Notes (Signed)
Pre visit review using our clinic review tool, if applicable. No additional management support is needed unless otherwise documented below in the visit note. 

## 2015-02-17 NOTE — Assessment & Plan Note (Signed)
Patient advised to resume her usual pain medications.

## 2015-02-19 ENCOUNTER — Ambulatory Visit (HOSPITAL_COMMUNITY): Payer: Medicare Other | Attending: Cardiology | Admitting: *Deleted

## 2015-02-19 DIAGNOSIS — I6523 Occlusion and stenosis of bilateral carotid arteries: Secondary | ICD-10-CM | POA: Insufficient documentation

## 2015-02-19 DIAGNOSIS — I739 Peripheral vascular disease, unspecified: Secondary | ICD-10-CM | POA: Insufficient documentation

## 2015-02-19 NOTE — Progress Notes (Signed)
Carotid Duplex Exam Performed 

## 2015-03-05 ENCOUNTER — Ambulatory Visit
Admission: RE | Admit: 2015-03-05 | Discharge: 2015-03-05 | Disposition: A | Discharge status: Home / Self Care | Payer: Medicare Other | Source: Ambulatory Visit | Attending: Internal Medicine | Admitting: Internal Medicine

## 2015-03-05 DIAGNOSIS — R51 Headache: Secondary | ICD-10-CM | POA: Diagnosis not present

## 2015-03-05 DIAGNOSIS — R519 Headache, unspecified: Secondary | ICD-10-CM

## 2015-03-05 DIAGNOSIS — G8929 Other chronic pain: Secondary | ICD-10-CM

## 2015-03-07 ENCOUNTER — Other Ambulatory Visit: Payer: Medicare Other

## 2015-03-14 ENCOUNTER — Ambulatory Visit (INDEPENDENT_AMBULATORY_CARE_PROVIDER_SITE_OTHER): Payer: Medicare Other | Admitting: Internal Medicine

## 2015-03-14 ENCOUNTER — Encounter: Payer: Self-pay | Admitting: Internal Medicine

## 2015-03-14 VITALS — BP 142/72 | HR 57 | Temp 98.0°F | Ht 62.0 in | Wt 118.0 lb

## 2015-03-14 DIAGNOSIS — G44041 Chronic paroxysmal hemicrania, intractable: Secondary | ICD-10-CM

## 2015-03-14 DIAGNOSIS — J208 Acute bronchitis due to other specified organisms: Secondary | ICD-10-CM

## 2015-03-14 DIAGNOSIS — M797 Fibromyalgia: Secondary | ICD-10-CM | POA: Diagnosis not present

## 2015-03-14 MED ORDER — PREDNISONE 20 MG PO TABS: ORAL_TABLET | ORAL | Status: DC

## 2015-03-14 MED ORDER — DOXYCYCLINE HYCLATE 100 MG PO CAPS: 100.0000 mg | ORAL_CAPSULE | Freq: Two times a day (BID) | ORAL | Status: DC

## 2015-03-14 MED ORDER — MORPHINE SULFATE ER 15 MG PO TBCR: 15.0000 mg | EXTENDED_RELEASE_TABLET | Freq: Two times a day (BID) | ORAL | Status: DC

## 2015-03-14 NOTE — Assessment & Plan Note (Signed)
Continue same dose of long acting morphine.

## 2015-03-14 NOTE — Assessment & Plan Note (Signed)
MRI of brain did not show any acute or reversible processes. 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 (Left frontoparietal deep white matter adjacent to the body of the left lateral ventricle. There is a few other small vessel infarctions in the deep white matter of the left hemisphere. There is an old white matter infarction in right posterior frontal subcortical white matter.)  Patient could not tolerate nortriptyline. It caused muscle spasms.  "Buzzing sensation" improved after stopping Lyrica.

## 2015-03-14 NOTE — Assessment & Plan Note (Signed)
Patient reports cough and wheezing for 2 weeks. She has diffuse wheezing on exam. Treat with doxycycline 100 mg twice daily and prednisone taper.  Patient advised to call office if symptoms persist or worsen.  Reassess in 2 weeks.

## 2015-03-14 NOTE — Progress Notes (Signed)
Subjective:    Patient ID: Joann Ray, female    DOB: Jul 14, 1946, 69 y.o.   MRN: [id number]  HPI  69 year old white female with history of hypertension, COPD and chronic fibromyalgia for follow-up. At previous visit patient complains of severe headache with buzzing-like sensation. MRI of brain without contrast obtained. No cause for headache identified. No acute or reversible finding. 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  Patient could not tolerate nortriptyline. She experienced muscle spasms. Patient also discontinued Lyrica as it made buzzing sensation worse. Patient also discontinued sertraline.  Patient complains of wheezing and cough for the last 2 weeks. Her husband Inocente Salles has similar illness. Patient denies shortness of breath.  Her symptoms started with sneezing and nasal congestion.  She reports using her albuterol nebulizer this morning.  Chronic pain - she is using long acting morphine once daily.  She reports she can tolerate once daily during bronchitis flare.  Review of Systems Negative for fever, negative for shortness of breath    Past Medical History  Diagnosis Date  . Anxiety   . Asthma   . Depression   . Hypertension   . Hypothyroidism   . COPD (chronic obstructive pulmonary disease)   . Fibromyalgia   . Hyperlipidemia   . Arrhythmia      AVNRT  . Anemia   . Migraine     History   Social History  . Marital Status: Married    Spouse Name: Sammy  . Number of Children: 3  . Years of Education: 12th/[employer]    Occupational History  . DISABLED    Social History Main Topics  . Smoking status: Former Smoker -- 0.50 packs/day for 40 years    Types: Cigarettes    Quit date: 11/29/2009  . Smokeless tobacco: Never Used     Comment: September 2011, again Oct 2013  . Alcohol Use: No  . Drug Use: No  . Sexual Activity: Not on file   Other Topics Concern  . Not on file   Social History Narrative   She has 36 year old granddaughter-Maggie   Diagnosed with juvenile arthritis   Pt lives at home with spouse.    Caffeine Use:2-3 cups daily.    Past Surgical History  Procedure Laterality Date  . Appendectomy    . Abdominal hysterectomy    . Nasal sinus surgery  1993  . Cardiac electrophysiology mapping and ablation  08/28/2010    EPS/RFA of AVNRT  - Dr. Crissie Sickles  . Coronary stent placement  09/2012  . Unilateral upper extremeity angiogram Left 10/18/2012    Procedure: UNILATERAL UPPER EXTREMEITY ANGIOGRAM;  Surgeon: Sherren Mocha, MD;  Location: [facility] CATH LAB;  Service: Cardiovascular;  Laterality: Left;  . Percutaneous stent intervention Left 10/18/2012    Procedure: PERCUTANEOUS STENT INTERVENTION;  Surgeon: Sherren Mocha, MD;  Location: [facility] CATH LAB;  Service: Cardiovascular;  Laterality: Left;    Family History  Problem Relation Age of Onset  . Colon cancer Sister 41  . Diabetes Mother   . Stroke Mother   . Dementia Mother   . Skin cancer Father   . Breast cancer Paternal Aunt   . Stomach cancer Paternal Aunt   . Stroke Sister     Allergies  Allergen Reactions  . Penicillins Anaphylaxis and Hives    Current Outpatient Prescriptions on File Prior to Visit  Medication Sig Dispense Refill  . acetaminophen (TYLENOL) 500 MG tablet Take 1 tablet (500 mg total) by mouth every 12 (twelve) hours as needed.  30 tablet 0  . albuterol (PROAIR HFA) 108 (90 BASE) MCG/ACT inhaler Inhale 2 puffs into the lungs 2 (two) times daily. 1 Inhaler 5  . albuterol (PROVENTIL) (2.5 MG/3ML) 0.083% nebulizer solution Take 3 mLs (2.5 mg total) by nebulization 4 (four) times daily as needed for wheezing or shortness of breath. 30 mL 11  . aspirin 81 MG chewable tablet Chew 81 mg by mouth daily.    Marland Kitchen atenolol (TENORMIN) 25 MG tablet Take 0.5 tablets (12.5 mg total) by mouth daily. 90 tablet 0  . Cholecalciferol (VITAMIN D) 2000 UNITS CAPS Take 1 capsule (2,000 Units total) by mouth daily. 30 capsule   . clonazePAM (KLONOPIN) 0.5 MG tablet  Take 1-2 tabs at bedtime as needed 60 tablet 3  . cloNIDine (CATAPRES) 0.1 MG tablet Take 0.5 tablets (0.05 mg total) by mouth 2 (two) times daily. 180 tablet 1  . losartan-hydrochlorothiazide (HYZAAR) 100-12.5 MG per tablet Take 1 tablet by mouth daily. 90 tablet 1  . morphine (MS CONTIN) 15 MG 12 hr tablet Take 1 tablet (15 mg total) by mouth every 12 (twelve) hours. 60 tablet 0  . nortriptyline (PAMELOR) 10 MG capsule Take 1 capsule (10 mg total) by mouth at bedtime. 30 capsule 2  . ondansetron (ZOFRAN) 4 MG tablet TAKE 1 TABLET BY MOUTH EVERY 6 HOURS AS NEEDED FOR NAUSEA AND VOMITING 30 tablet 1  . OxyCODONE HCl, Abuse Deter, 5 MG TABA Take 5 mg by mouth daily as needed (Use for breakthrough pain). 30 tablet 0  . pantoprazole (PROTONIX) 40 MG tablet Take 1 tablet (40 mg total) by mouth daily. 90 tablet 1  . pravastatin (PRAVACHOL) 20 MG tablet Take 1 tablet (20 mg total) by mouth daily. 90 tablet 1  . pregabalin (LYRICA) 75 MG capsule Take 1 capsule (75 mg total) by mouth at bedtime. 30 capsule 2  . sertraline (ZOLOFT) 25 MG tablet Take 1 tablet (25 mg total) by mouth daily. 30 tablet 3  . SYNTHROID 125 MCG tablet Take 1 tablet (125 mcg total) by mouth daily before breakfast. 90 tablet 1  . Vitamin D, Ergocalciferol, (DRISDOL) 50000 UNITS CAPS capsule Take 1 capsule (50,000 Units total) by mouth every 7 (seven) days. 12 capsule 0   No current facility-administered medications on file prior to visit.    BP 142/72 mmHg  Pulse 57  Temp(Src) 98 F (36.7 C) (Oral)  Ht 5\' 2"  (1.575 m)  Wt 118 lb (53.524 kg)  BMI 21.58 kg/m2    Objective:   Physical Exam  Constitutional: She is oriented to person, place, and time. She appears well-developed and well-nourished. No distress.  HENT:  Head: Normocephalic and atraumatic.  Right Ear: External ear normal.  Left Ear: External ear normal.  Oropharyngeal erythema without exudates  Neck: Neck supple.  No neck tenderness  Cardiovascular: Normal  rate, regular rhythm and normal heart sounds.   No murmur heard. Pulmonary/Chest: Effort normal. She has no rales.  Diffuse expiratory wheeze  Musculoskeletal: She exhibits no edema.  Neurological: She is alert and oriented to person, place, and time. No cranial nerve deficit.  Skin: Skin is warm and dry.  Psychiatric: She has a normal mood and affect. Her behavior is normal.          Assessment & Plan:

## 2015-03-14 NOTE — Patient Instructions (Signed)
Use albuterol nebulizer 2.5 mg / 3 ml four times a day as needed Please contact our office if your symptoms do not improve or gets worse.

## 2015-03-14 NOTE — Progress Notes (Signed)
Pre visit review using our clinic review tool, if applicable. No additional management support is needed unless otherwise documented below in the visit note. 

## 2015-03-24 ENCOUNTER — Encounter: Payer: Self-pay | Admitting: Cardiovascular Disease

## 2015-03-24 ENCOUNTER — Ambulatory Visit (INDEPENDENT_AMBULATORY_CARE_PROVIDER_SITE_OTHER): Payer: Medicare Other | Admitting: Cardiovascular Disease

## 2015-03-24 VITALS — BP 116/64 | HR 51 | Ht 62.0 in | Wt 121.4 lb

## 2015-03-24 DIAGNOSIS — I10 Essential (primary) hypertension: Secondary | ICD-10-CM

## 2015-03-24 DIAGNOSIS — I739 Peripheral vascular disease, unspecified: Secondary | ICD-10-CM | POA: Diagnosis not present

## 2015-03-24 DIAGNOSIS — E785 Hyperlipidemia, unspecified: Secondary | ICD-10-CM | POA: Diagnosis not present

## 2015-03-24 NOTE — Progress Notes (Signed)
Cardiology Office Note   Date:  03/24/2015   ID:  Joann Ray, DOB 19-Apr-1946, MRN [id number]  PCP:  Drema Pry, DO  Cardiologist:  Sherren Mocha, MD    No chief complaint on file.    History of Present Illness: Joann Ray is a 69 y.o. female who presents for follow-up of peripheral arterial disease. 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 She also has carotid and lower extremity PAD. She has no history of stroke or TIA.  Overall she reports no change in symptoms. She denies chest pain, chest pressure, or shortness of breath. She has no complaints of left arm pain with exertion.   Past Medical History  Diagnosis Date  . Anxiety   . Asthma   . Depression   . Hypertension   . Hypothyroidism   . COPD (chronic obstructive pulmonary disease)   . Fibromyalgia   . Hyperlipidemia   . Arrhythmia      AVNRT  . Anemia   . Migraine     Past Surgical History  Procedure Laterality Date  . Appendectomy    . Abdominal hysterectomy    . Nasal sinus surgery  1993  . Cardiac electrophysiology mapping and ablation  08/28/2010    EPS/RFA of AVNRT  - Dr. Crissie Sickles  . Coronary stent placement  09/2012  . Unilateral upper extremeity angiogram Left 10/18/2012    Procedure: UNILATERAL UPPER EXTREMEITY ANGIOGRAM;  Surgeon: Sherren Mocha, MD;  Location: [facility] CATH LAB;  Service: Cardiovascular;  Laterality: Left;  . Percutaneous stent intervention Left 10/18/2012    Procedure: PERCUTANEOUS STENT INTERVENTION;  Surgeon: Sherren Mocha, MD;  Location: [facility] CATH LAB;  Service: Cardiovascular;  Laterality: Left;    Current Outpatient Prescriptions  Medication Sig Dispense Refill  . acetaminophen (TYLENOL) 500 MG tablet Take 1 tablet (500 mg total) by mouth every 12 (twelve) hours as needed. 30 tablet 0  . albuterol (PROAIR HFA) 108 (90 BASE) MCG/ACT inhaler Inhale 2 puffs into the lungs 2 (two) times daily. 1 Inhaler 5  .  albuterol (PROVENTIL) (2.5 MG/3ML) 0.083% nebulizer solution Take 3 mLs (2.5 mg total) by nebulization 4 (four) times daily as needed for wheezing or shortness of breath. 30 mL 11  . aspirin 81 MG chewable tablet Chew 81 mg by mouth daily.    Marland Kitchen atenolol (TENORMIN) 25 MG tablet Take 0.5 tablets (12.5 mg total) by mouth daily. 90 tablet 0  . Cholecalciferol (VITAMIN D) 2000 UNITS CAPS Take 1 capsule (2,000 Units total) by mouth daily. 30 capsule   . clonazePAM (KLONOPIN) 0.5 MG tablet Take 1-2 tabs at bedtime as needed 60 tablet 3  . cloNIDine (CATAPRES) 0.1 MG tablet Take 0.5 tablets (0.05 mg total) by mouth 2 (two) times daily. 180 tablet 1  . doxycycline (VIBRAMYCIN) 100 MG capsule Take 1 capsule (100 mg total) by mouth 2 (two) times daily. 20 capsule 0  . losartan-hydrochlorothiazide (HYZAAR) 100-12.5 MG per tablet Take 1 tablet by mouth daily. 90 tablet 1  . morphine (MS CONTIN) 15 MG 12 hr tablet Take 1 tablet (15 mg total) by mouth every 12 (twelve) hours. 60 tablet 0  . ondansetron (ZOFRAN) 4 MG tablet TAKE 1 TABLET BY MOUTH EVERY 6 HOURS AS NEEDED FOR NAUSEA AND VOMITING 30 tablet 1  . pantoprazole (PROTONIX) 40 MG tablet Take 1 tablet (40 mg total) by mouth daily. 90 tablet 1  . pravastatin (PRAVACHOL) 20 MG tablet Take 1 tablet (20  mg total) by mouth daily. 90 tablet 1  . predniSONE (DELTASONE) 20 MG tablet Take 1 tab twice daily for 4 days, then 1/2 tab twice daily for 4 days, then 1/2 tab once daily for 4 days 14 tablet 0  . SYNTHROID 125 MCG tablet Take 1 tablet (125 mcg total) by mouth daily before breakfast. 90 tablet 1   No current facility-administered medications for this visit.    Allergies:   Penicillins   Social History:  The patient  reports that she quit smoking about 5 years ago. Her smoking use included Cigarettes. She has a 20 pack-year smoking history. She has never used smokeless tobacco. She reports that she does not drink alcohol or use illicit drugs.   Family  History:  The patient's  family history includes Breast cancer in her paternal aunt; Colon cancer (age of onset: 14) in her sister; Dementia in her mother; Diabetes in her mother; Skin cancer in her father; Stomach cancer in her paternal aunt; Stroke in her mother and sister.    ROS:  Please see the history of present illness. All other systems are reviewed and negative.    PHYSICAL EXAM: VS:  BP 116/64 mmHg  Pulse 51  Ht 5\' 2"  (1.575 m)  Wt 121 lb 6.4 oz (55.067 kg)  BMI 22.20 kg/m2 , BMI Body mass index is 22.2 kg/(m^2). GEN: Well nourished, well developed, in no acute distress HEENT: normal Neck: no JVD, no masses. There is a left carotid bruit Cardiac: Bradycardic and regular without murmur or gallop  Respiratory:  clear to auscultation bilaterally, normal work of breathing GI: soft, nontender, nondistended, + BS MS: no deformity or atrophy Ext: no pretibial edema Skin: warm and dry, no rash Neuro:  Strength and sensation are intact Psych: euthymic mood, full affect  EKG:  EKG is ordered today. The ekg ordered today shows sinus bradycardia 51 bpm, nonspecific T wave abnormality.  Recent Labs: 01/09/2015: ALT 11; Hemoglobin 12.0; Platelets 279.0; TSH 15.06* 02/17/2015: BUN 10; Creatinine 0.66; Potassium 3.8; Sodium 134*   Lipid Panel     Component Value Date/Time   CHOL 245* 01/09/2015 0939   TRIG 100.0 01/09/2015 0939   HDL 56.50 01/09/2015 0939   CHOLHDL 4 01/09/2015 0939   VLDL 20.0 01/09/2015 0939   LDLCALC 169* 01/09/2015 0939   LDLDIRECT 135.9 10/02/2013 0949      Wt Readings from Last 3 Encounters:  03/24/15 121 lb 6.4 oz (55.067 kg)  03/14/15 118 lb (53.524 kg)  02/17/15 121 lb (54.885 kg)    Cardiac Studies Reviewed: Carotid duplex scan 02/20/2015 was reviewed. This shows 60-79% left internal carotid artery stenosis. There is mild stenosis on the right. The left subclavian stent is patent.  ASSESSMENT AND PLAN: 1.  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 Blood pressures are equal in both arms. Vertebral flow on the left is antegrade and this suggests patency of her stent. I will see her back in one year.  2. Carotid stenosis without history of stroke. Reviewed duplex findings with her. She does have moderate left internal carotid artery stenosis, but nowhere near meeting and indication for revascularization. She should have a repeat scan next year.  3. Hyperlipidemia: Lipids are well above goal. She has a lot of trouble tolerating statins. I recommended referral to the lipid clinic for consideration of a PCSK9 inhibitor.   Current medicines are reviewed with the patient today.  The patient does  not have concerns regarding medicines.  Labs/ tests ordered today include:  Orders Placed This Encounter  Procedures  . Ambulatory referral to Lipid Clinic  . EKG 12-Lead    Disposition:   FU one year  Signed, Sherren Mocha, MD  03/24/2015 5:54 PM    [facility] [address], [place], [place]  [address] Phone: [phone]; Fax: [phone]

## 2015-03-24 NOTE — Patient Instructions (Signed)
Medication Instructions:  Your physician recommends that you continue on your current medications as directed. Please refer to the Current Medication list given to you today.  Labwork: No new orders.  Testing/Procedures: Your physician has requested that you have a carotid duplex in 1 YEAR. This test is an ultrasound of the carotid arteries in your neck. It looks at blood flow through these arteries that supply the brain with blood. Allow one hour for this exam. There are no restrictions or special instructions.  You have been referred to LIPID clinic for evaluation of PCSK9.   Follow-Up: Your physician wants you to follow-up in:  1 YEAR with Dr Burt Knack.  You will receive a reminder letter in the mail two months in advance. If you don't receive a letter, please call our office to schedule the follow-up appointment.   Any Other Special Instructions Will Be Listed Below (If Applicable).

## 2015-04-03 ENCOUNTER — Ambulatory Visit: Payer: Medicare Other | Admitting: Pharmacist

## 2015-04-08 ENCOUNTER — Ambulatory Visit: Payer: Medicare Other | Admitting: Pharmacist

## 2015-04-09 ENCOUNTER — Ambulatory Visit: Payer: Medicare Other | Admitting: Internal Medicine

## 2015-04-18 ENCOUNTER — Ambulatory Visit: Payer: Medicare Other | Admitting: Internal Medicine

## 2015-04-20 ENCOUNTER — Other Ambulatory Visit: Payer: Self-pay | Admitting: Internal Medicine

## 2015-04-29 ENCOUNTER — Encounter: Payer: Self-pay | Admitting: Family Medicine

## 2015-04-29 ENCOUNTER — Ambulatory Visit (INDEPENDENT_AMBULATORY_CARE_PROVIDER_SITE_OTHER): Payer: Medicare Other | Admitting: Family Medicine

## 2015-04-29 VITALS — BP 120/76 | HR 59 | Temp 97.9°F | Ht 62.0 in | Wt 118.0 lb

## 2015-04-29 DIAGNOSIS — N760 Acute vaginitis: Secondary | ICD-10-CM

## 2015-04-29 MED ORDER — FLUCONAZOLE 150 MG PO TABS: 150.0000 mg | ORAL_TABLET | Freq: Once | ORAL | Status: DC

## 2015-04-29 NOTE — Progress Notes (Signed)
HPI:  Vulvovag pruritis: -started a couple of weeks ago -vulvar pruritis and irritation -tried OTC yeast treatment every day for the last few weeks -denies swelling or bleeding -rarely sexually active only with husband - not sexually active in several months -S/p hysterectomy for fibroids -no fevers, vomiting, dysuria, flank pain, pelvic pain, sig discharge  ROS: See pertinent positives and negatives per HPI.  Past Medical History  Diagnosis Date  . Anxiety   . Asthma   . Depression   . Hypertension   . Hypothyroidism   . COPD (chronic obstructive pulmonary disease)   . Fibromyalgia   . Hyperlipidemia   . Arrhythmia      AVNRT  . Anemia   . Migraine     Past Surgical History  Procedure Laterality Date  . Appendectomy    . Abdominal hysterectomy    . Nasal sinus surgery  1993  . Cardiac electrophysiology mapping and ablation  08/28/2010    EPS/RFA of AVNRT  - Dr. Crissie Sickles  . Coronary stent placement  09/2012  . Unilateral upper extremeity angiogram Left 10/18/2012    Procedure: UNILATERAL UPPER EXTREMEITY ANGIOGRAM;  Surgeon: Sherren Mocha, MD;  Location: [facility] CATH LAB;  Service: Cardiovascular;  Laterality: Left;  . Percutaneous stent intervention Left 10/18/2012    Procedure: PERCUTANEOUS STENT INTERVENTION;  Surgeon: Sherren Mocha, MD;  Location: [facility] CATH LAB;  Service: Cardiovascular;  Laterality: Left;    Family History  Problem Relation Age of Onset  . Colon cancer Sister 10  . Diabetes Mother   . Stroke Mother   . Dementia Mother   . Skin cancer Father   . Breast cancer Paternal Aunt   . Stomach cancer Paternal Aunt   . Stroke Sister     History   Social History  . Marital Status: Married    Spouse Name: Sammy  . Number of Children: 3  . Years of Education: 12th/[employer]    Occupational History  . DISABLED    Social History Main Topics  . Smoking status: Former Smoker -- 0.50 packs/day for 40 years    Types: Cigarettes    Quit date: 11/29/2009   . Smokeless tobacco: Never Used     Comment: September 2011, again Oct 2013  . Alcohol Use: No  . Drug Use: No  . Sexual Activity: Not on file   Other Topics Concern  . None   Social History Narrative   She has 30 year old granddaughter-Maggie   Diagnosed with juvenile arthritis   Pt lives at home with spouse.    Caffeine Use:2-3 cups daily.     Current outpatient prescriptions:  .  acetaminophen (TYLENOL) 500 MG tablet, Take 1 tablet (500 mg total) by mouth every 12 (twelve) hours as needed., Disp: 30 tablet, Rfl: 0 .  aspirin 81 MG chewable tablet, Chew 81 mg by mouth daily., Disp: , Rfl:  .  atenolol (TENORMIN) 25 MG tablet, Take 0.5 tablets (12.5 mg total) by mouth daily., Disp: 90 tablet, Rfl: 0 .  Cholecalciferol (VITAMIN D) 2000 UNITS CAPS, Take 1 capsule (2,000 Units total) by mouth daily., Disp: 30 capsule, Rfl:  .  clonazePAM (KLONOPIN) 0.5 MG tablet, Take 1-2 tabs at bedtime as needed, Disp: 60 tablet, Rfl: 3 .  cloNIDine (CATAPRES) 0.1 MG tablet, Take 0.5 tablets (0.05 mg total) by mouth 2 (two) times daily., Disp: 180 tablet, Rfl: 1 .  losartan-hydrochlorothiazide (HYZAAR) 100-12.5 MG per tablet, Take 1 tablet by mouth daily., Disp: 90 tablet, Rfl: 1 .  morphine (MS CONTIN) 15 MG 12 hr tablet, Take 1 tablet (15 mg total) by mouth every 12 (twelve) hours., Disp: 60 tablet, Rfl: 0 .  ondansetron (ZOFRAN) 4 MG tablet, TAKE 1 TABLET BY MOUTH EVERY 6 HOURS AS NEEDED FOR NAUSEA AND VOMITING, Disp: 30 tablet, Rfl: 1 .  pantoprazole (PROTONIX) 40 MG tablet, Take 1 tablet (40 mg total) by mouth daily., Disp: 90 tablet, Rfl: 1 .  pravastatin (PRAVACHOL) 20 MG tablet, Take 1 tablet (20 mg total) by mouth daily., Disp: 90 tablet, Rfl: 1 .  PROAIR HFA 108 (90 BASE) MCG/ACT inhaler, INHALE 2 PUFFS INTO THE LUNGS TWICE A DAY, Disp: 8.5 Inhaler, Rfl: 5 .  SYNTHROID 125 MCG tablet, Take 1 tablet (125 mcg total) by mouth daily before breakfast., Disp: 90 tablet, Rfl: 1 .  fluconazole  (DIFLUCAN) 150 MG tablet, Take 1 tablet (150 mg total) by mouth once., Disp: 1 tablet, Rfl: 0  EXAM:  Filed Vitals:   04/29/15 1330  BP: 120/76  Pulse: 59  Temp: 97.9 F (36.6 C)    Body mass index is 21.58 kg/(m^2).  GENERAL: vitals reviewed and listed above, alert, oriented, appears well hydrated and in no acute distress  HEENT: atraumatic, conjunttiva clear, no obvious abnormalities on inspection of external nose and ears  NECK: no obvious masses on inspection  LUNGS: clear to auscultation bilaterally, no wheezes, rales or rhonchi, good air movement  CV: HRRR, no peripheral edema  GU: mild irritation of external genitalia, no swelling or significant rash, no swelling or masses of vaginal vault, no discharge.  MS: moves all extremities without noticeable abnormality  PSYCH: pleasant and cooperative, no obvious depression or anxiety  ASSESSMENT AND PLAN:  Discussed the following assessment and plan:  Vaginitis and vulvovaginitis - Plan: fluconazole (DIFLUCAN) 150 MG tablet  -other then mild irritation of the external genitalia, no other findings on exam. Suspect yeast with partial resolution with OTC treatments -opted for tx with diflucan (risks discussed), cotton cool clothing, see gyn or follow up if persists in 1 week or new symptoms -Patient advised to return or notify a doctor immediately if symptoms worsen or persist or new concerns arise.  There are no Patient Instructions on file for this visit.   Colin Benton R.

## 2015-04-29 NOTE — Progress Notes (Signed)
Pre visit review using our clinic review tool, if applicable. No additional management support is needed unless otherwise documented below in the visit note. 

## 2015-05-02 ENCOUNTER — Telehealth: Payer: Self-pay | Admitting: Internal Medicine

## 2015-05-02 ENCOUNTER — Telehealth: Payer: Self-pay | Admitting: *Deleted

## 2015-05-02 ENCOUNTER — Ambulatory Visit: Payer: Medicare Other | Admitting: Internal Medicine

## 2015-05-02 NOTE — Telephone Encounter (Signed)
Patient Name: Joann Ray [facility] DOB: 1945/12/19 Initial Comment Caller states she is wheezing, may be due to morphine Nurse Assessment Nurse: Marcelline Deist, RN, Kermit Balo Date/Time (Eastern Time): 05/02/2015 11:22:47 AM Confirm and document reason for call. If symptomatic, describe symptoms. ---Caller states she is wheezing, having to use inhaler & has already had asthma attacks & bronchitis. She knows it is from Morphine that she takes for arthritis & fibromyalgia. She has not been able to get follow-up appts. d/t her Dr. being out. Has the patient traveled out of the country within the last 30 days? ---Not Applicable Does the patient require triage? ---Yes Related visit to physician within the last 2 weeks? ---No Does the PT have any chronic conditions? (i.e. diabetes, asthma, etc.) ---Yes List chronic conditions. ---arthritis, fibromyalgia, silent migranes, asthma Guidelines Guideline Title Affirmed Question Affirmed Notes Asthma Attack [1] SEVERE asthma attack (e.g., very SOB at rest, speaks in single words, loud wheezes) AND [2] not resolved after 2 nebulizer or inhaler treatments Final Disposition User Go to ED Now Marcelline Deist, RN, Lynda Comments Caller states she won't go in to the ER. Would like whoever is covering for Dr. Shawna Orleans to prescribe something different because the Morphine is exacerbating her asthma symptoms. Nurse notified office of refusal of ER outcome. Caller uses [facility].

## 2015-05-02 NOTE — Telephone Encounter (Signed)
Kermit Balo, RN for [facility] called about this patient refusing to go to the ED. Patient is on morphine for other chronic symptoms and is causing problems with her asthma (wheezing). Patient prefers to have some other medication besides the morphine.

## 2015-05-02 NOTE — Telephone Encounter (Signed)
I called and spoke to patient after speaking with Dr Sarajane Jews who reviewed the [facility] note with me. Pt says she has a nebulizer and an inhaler to treat her sx. She would like Dr Shawna Orleans to consider changing her medication from Morphine to Percocet.Is anxious to receive a call back today if possible.

## 2015-05-02 NOTE — Telephone Encounter (Signed)
I suggest pt be seen today for wheezing.  Please see if Dr. Raliegh Ip can add her on.  Perhaps he can help make determination whether morphine contributing to her wheezing.

## 2015-05-02 NOTE — Telephone Encounter (Signed)
Error

## 2015-05-02 NOTE — Telephone Encounter (Signed)
Pt can not make her appt today patient resch to 05-05-15.

## 2015-05-05 ENCOUNTER — Ambulatory Visit (INDEPENDENT_AMBULATORY_CARE_PROVIDER_SITE_OTHER): Payer: Medicare Other | Admitting: Internal Medicine

## 2015-05-05 ENCOUNTER — Encounter: Payer: Self-pay | Admitting: Internal Medicine

## 2015-05-05 VITALS — BP 130/86 | HR 70 | Temp 98.4°F | Resp 20 | Ht 62.0 in | Wt 118.0 lb

## 2015-05-05 DIAGNOSIS — I739 Peripheral vascular disease, unspecified: Secondary | ICD-10-CM | POA: Diagnosis not present

## 2015-05-05 DIAGNOSIS — I10 Essential (primary) hypertension: Secondary | ICD-10-CM

## 2015-05-05 DIAGNOSIS — M797 Fibromyalgia: Secondary | ICD-10-CM | POA: Diagnosis not present

## 2015-05-05 DIAGNOSIS — J441 Chronic obstructive pulmonary disease with (acute) exacerbation: Secondary | ICD-10-CM

## 2015-05-05 MED ORDER — FLUTICASONE-SALMETEROL 100-50 MCG/DOSE IN AEPB: 1.0000 | INHALATION_SPRAY | Freq: Two times a day (BID) | RESPIRATORY_TRACT | Status: DC

## 2015-05-05 MED ORDER — OXYCODONE-ACETAMINOPHEN 5-325 MG PO TABS: 1.0000 | ORAL_TABLET | Freq: Three times a day (TID) | ORAL | Status: DC | PRN

## 2015-05-05 NOTE — Progress Notes (Signed)
Pre visit review using our clinic review tool, if applicable. No additional management support is needed unless otherwise documented below in the visit note. 

## 2015-05-05 NOTE — Patient Instructions (Signed)
Discontinue morphine sulfate Advair one inhalation twice daily Return in one month for follow-up  Chronic Obstructive Pulmonary Disease Exacerbation  Chronic obstructive pulmonary disease (COPD) is a common lung problem. In COPD, the flow of air from the lungs is limited. COPD exacerbations are times that breathing gets worse and you need extra treatment. Without treatment they can be life threatening. If they happen often, your lungs can become more damaged. HOME CARE  Do not smoke.  Avoid tobacco smoke and other things that bother your lungs.  If given, take your antibiotic medicine as told. Finish the medicine even if you start to feel better.  Only take medicines as told by your doctor.  Drink enough fluids to keep your pee (urine) clear or pale yellow (unless your doctor has told you not to).  Use a cool mist machine (vaporizer).  If you use oxygen or a machine that turns liquid medicine into a mist (nebulizer), continue to use them as told.  Keep up with shots (vaccinations) as told by your doctor.  Exercise regularly.  Eat healthy foods.  Keep all doctor visits as told. GET HELP RIGHT AWAY IF:  You are very short of breath and it gets worse.  You have trouble talking.  You have bad chest pain.  You have blood in your spit (sputum).  You have a fever.  You keep throwing up (vomiting).  You feel weak, or you pass out (faint).  You feel confused.  You keep getting worse. MAKE SURE YOU:   Understand these instructions.  Will watch your condition.  Will get help right away if you are not doing well or get worse. Document Released: 11/04/2011 Document Revised: 09/05/2013 Document Reviewed: 07/20/2013 [facility] Patient Information 2015 [place], [place]. This information is not intended to replace advice given to you by your health care provider. Make sure you discuss any questions you have with your health care provider.

## 2015-05-05 NOTE — Progress Notes (Signed)
Subjective:    Patient ID: Joann Ray, female    DOB: 01-14-1946, 69 y.o.   MRN: [id number]  HPI  69 year old patient who has a history of COPD.  She also has a history of peripheral vascular disease, fibromyalgia and chronic pain.  She presents today complaining of increasing wheezing that she attributes to a switch to morphine sulfate.  This switch was made probably in December, but she noted increasing wheezing in April.  She has had increased albuterol use.  It sounds like the wheezing was precipitated by a URI.  She wishes to switch that to Percocet that she feels was better tolerated  Past Medical History  Diagnosis Date  . Anxiety   . Asthma   . Depression   . Hypertension   . Hypothyroidism   . COPD (chronic obstructive pulmonary disease)   . Fibromyalgia   . Hyperlipidemia   . Arrhythmia      AVNRT  . Anemia   . Migraine     History   Social History  . Marital Status: Married    Spouse Name: Sammy  . Number of Children: 3  . Years of Education: 12th/[employer]    Occupational History  . DISABLED    Social History Main Topics  . Smoking status: Former Smoker -- 0.50 packs/day for 40 years    Types: Cigarettes    Quit date: 11/29/2009  . Smokeless tobacco: Never Used     Comment: September 2011, again Oct 2013  . Alcohol Use: No  . Drug Use: No  . Sexual Activity: Not on file   Other Topics Concern  . Not on file   Social History Narrative   She has 31 year old granddaughter-Maggie   Diagnosed with juvenile arthritis   Pt lives at home with spouse.    Caffeine Use:2-3 cups daily.    Past Surgical History  Procedure Laterality Date  . Appendectomy    . Abdominal hysterectomy    . Nasal sinus surgery  1993  . Cardiac electrophysiology mapping and ablation  08/28/2010    EPS/RFA of AVNRT  - Dr. Crissie Sickles  . Coronary stent placement  09/2012  . Unilateral upper extremeity angiogram Left 10/18/2012    Procedure: UNILATERAL UPPER EXTREMEITY  ANGIOGRAM;  Surgeon: Sherren Mocha, MD;  Location: [facility] CATH LAB;  Service: Cardiovascular;  Laterality: Left;  . Percutaneous stent intervention Left 10/18/2012    Procedure: PERCUTANEOUS STENT INTERVENTION;  Surgeon: Sherren Mocha, MD;  Location: [facility] CATH LAB;  Service: Cardiovascular;  Laterality: Left;    Family History  Problem Relation Age of Onset  . Colon cancer Sister 64  . Diabetes Mother   . Stroke Mother   . Dementia Mother   . Skin cancer Father   . Breast cancer Paternal Aunt   . Stomach cancer Paternal Aunt   . Stroke Sister     Allergies  Allergen Reactions  . Penicillins Anaphylaxis and Hives    Current Outpatient Prescriptions on File Prior to Visit  Medication Sig Dispense Refill  . acetaminophen (TYLENOL) 500 MG tablet Take 1 tablet (500 mg total) by mouth every 12 (twelve) hours as needed. 30 tablet 0  . aspirin 81 MG chewable tablet Chew 81 mg by mouth daily.    Marland Kitchen atenolol (TENORMIN) 25 MG tablet Take 0.5 tablets (12.5 mg total) by mouth daily. 90 tablet 0  . Cholecalciferol (VITAMIN D) 2000 UNITS CAPS Take 1 capsule (2,000 Units total) by mouth daily. 30 capsule   . clonazePAM (  KLONOPIN) 0.5 MG tablet Take 1-2 tabs at bedtime as needed 60 tablet 3  . cloNIDine (CATAPRES) 0.1 MG tablet Take 0.5 tablets (0.05 mg total) by mouth 2 (two) times daily. 180 tablet 1  . losartan-hydrochlorothiazide (HYZAAR) 100-12.5 MG per tablet Take 1 tablet by mouth daily. 90 tablet 1  . morphine (MS CONTIN) 15 MG 12 hr tablet Take 1 tablet (15 mg total) by mouth every 12 (twelve) hours. 60 tablet 0  . ondansetron (ZOFRAN) 4 MG tablet TAKE 1 TABLET BY MOUTH EVERY 6 HOURS AS NEEDED FOR NAUSEA AND VOMITING 30 tablet 1  . pantoprazole (PROTONIX) 40 MG tablet Take 1 tablet (40 mg total) by mouth daily. 90 tablet 1  . pravastatin (PRAVACHOL) 20 MG tablet Take 1 tablet (20 mg total) by mouth daily. 90 tablet 1  . PROAIR HFA 108 (90 BASE) MCG/ACT inhaler INHALE 2 PUFFS INTO THE LUNGS  TWICE A DAY 8.5 Inhaler 5  . SYNTHROID 125 MCG tablet Take 1 tablet (125 mcg total) by mouth daily before breakfast. 90 tablet 1   No current facility-administered medications on file prior to visit.    BP 130/86 mmHg  Pulse 70  Temp(Src) 98.4 F (36.9 C) (Oral)  Resp 20  Ht 5\' 2"  (1.575 m)  Wt 118 lb (53.524 kg)  BMI 21.58 kg/m2  SpO2 99%     Review of Systems  HENT: Negative for congestion, dental problem, hearing loss, rhinorrhea, sinus pressure, sore throat and tinnitus.   Eyes: Negative for pain, discharge and visual disturbance.  Respiratory: Positive for wheezing. Negative for cough and shortness of breath.   Cardiovascular: Negative for chest pain, palpitations and leg swelling.  Gastrointestinal: Negative for nausea, vomiting, abdominal pain, diarrhea, constipation, blood in stool and abdominal distention.  Genitourinary: Negative for dysuria, urgency, frequency, hematuria, flank pain, vaginal bleeding, vaginal discharge, difficulty urinating, vaginal pain and pelvic pain.  Musculoskeletal: Positive for myalgias, back pain and arthralgias. Negative for joint swelling and gait problem.  Skin: Negative for rash.  Neurological: Positive for weakness. Negative for dizziness, syncope, speech difficulty, numbness and headaches.  Hematological: Negative for adenopathy.  Psychiatric/Behavioral: Negative for behavioral problems, dysphoric mood and agitation. The patient is not nervous/anxious.        Objective:   Physical Exam  Constitutional: She is oriented to person, place, and time. She appears well-developed and well-nourished.  Appears older than stated age  HENT:  Head: Normocephalic.  Right Ear: External ear normal.  Left Ear: External ear normal.  Mouth/Throat: Oropharynx is clear and moist.  Eyes: Conjunctivae and EOM are normal. Pupils are equal, round, and reactive to light.  Neck: Normal range of motion. Neck supple. No thyromegaly present.  Cardiovascular:  Normal rate, regular rhythm, normal heart sounds and intact distal pulses.   Pulmonary/Chest: Effort normal.  Prolonged expiratory phase Mild and expiratory wheezing No increased work of breathing O2 saturation 99% No tachycardia  Abdominal: Soft. Bowel sounds are normal. She exhibits no mass. There is no tenderness.  Musculoskeletal: Normal range of motion.  Lymphadenopathy:    She has no cervical adenopathy.  Neurological: She is alert and oriented to person, place, and time.  Skin: Skin is warm and dry. No rash noted.  Psychiatric: She has a normal mood and affect. Her behavior is normal.          Assessment & Plan:   Probable COPD with mild exacerbation.  Patient wishes to try Percocet.  She feels that the morphine may be a factor  with the increase wheezing.  We'll switch to Percocet 5 mg 3 times a day.  Add Advair twice a day Recheck one month  Chronic pain.  Morphine sulfate discontinued.  Placed on Percocet.  Recheck 4 weeks Fibromyalgia Essential hypertension, controlled

## 2015-05-18 ENCOUNTER — Other Ambulatory Visit: Payer: Self-pay | Admitting: Internal Medicine

## 2015-05-19 ENCOUNTER — Other Ambulatory Visit: Payer: Self-pay | Admitting: Internal Medicine

## 2015-05-19 MED ORDER — CLONAZEPAM 0.5 MG PO TABS: ORAL_TABLET | ORAL | Status: DC

## 2015-05-19 NOTE — Telephone Encounter (Signed)
Per Dr Shawna Orleans ok x3, rx called in to [place]

## 2015-05-19 NOTE — Telephone Encounter (Signed)
Pt is out of clonazepam 0.5 mg #60 for 30 day supply call in to [facility]

## 2015-05-21 ENCOUNTER — Ambulatory Visit: Payer: Medicare Other | Admitting: Internal Medicine

## 2015-05-28 MED ORDER — OXYCODONE-ACETAMINOPHEN 5-325 MG PO TABS: 1.0000 | ORAL_TABLET | Freq: Three times a day (TID) | ORAL | Status: DC | PRN

## 2015-05-29 ENCOUNTER — Telehealth: Payer: Self-pay | Admitting: *Deleted

## 2015-05-29 MED ORDER — OXYCODONE-ACETAMINOPHEN 5-325 MG PO TABS: 1.0000 | ORAL_TABLET | Freq: Three times a day (TID) | ORAL | Status: DC | PRN

## 2015-05-30 NOTE — Telephone Encounter (Signed)
rx ready for pickup 

## 2015-06-26 ENCOUNTER — Other Ambulatory Visit: Payer: Self-pay | Admitting: Internal Medicine

## 2015-06-30 ENCOUNTER — Telehealth: Payer: Self-pay | Admitting: Internal Medicine

## 2015-06-30 MED ORDER — OXYCODONE-ACETAMINOPHEN 5-325 MG PO TABS: 1.0000 | ORAL_TABLET | Freq: Three times a day (TID) | ORAL | Status: DC | PRN

## 2015-06-30 NOTE — Telephone Encounter (Signed)
Ok to RF x 1

## 2015-06-30 NOTE — Telephone Encounter (Signed)
Pt request refill of the following: oxyCODONE-acetaminophen (ROXICET) 5-325 MG per tablet   Phamacy:

## 2015-07-01 NOTE — Telephone Encounter (Signed)
Rx ready for pick up and Left message on machine for patient   

## 2015-07-09 ENCOUNTER — Encounter: Payer: Self-pay | Admitting: Internal Medicine

## 2015-07-09 ENCOUNTER — Ambulatory Visit (INDEPENDENT_AMBULATORY_CARE_PROVIDER_SITE_OTHER): Payer: Medicare Other | Admitting: Internal Medicine

## 2015-07-09 VITALS — BP 120/72 | HR 51 | Temp 98.1°F | Ht 62.0 in | Wt 118.2 lb

## 2015-07-09 DIAGNOSIS — E039 Hypothyroidism, unspecified: Secondary | ICD-10-CM | POA: Diagnosis not present

## 2015-07-09 DIAGNOSIS — I10 Essential (primary) hypertension: Secondary | ICD-10-CM | POA: Diagnosis not present

## 2015-07-09 DIAGNOSIS — M797 Fibromyalgia: Secondary | ICD-10-CM

## 2015-07-09 LAB — BASIC METABOLIC PANEL
BUN: 12 mg/dL (ref 6–23)
CALCIUM: 9.4 mg/dL (ref 8.4–10.5)
CO2: 28 mEq/L (ref 19–32)
CREATININE: 0.77 mg/dL (ref 0.40–1.20)
Chloride: 95 mEq/L — ABNORMAL LOW (ref 96–112)
GFR: 79.49 mL/min (ref >60.00)
Glucose, Bld: 127 mg/dL — ABNORMAL HIGH (ref 70–99)
Potassium: 4.8 mEq/L (ref 3.5–5.1)
Sodium: 132 mEq/L — ABNORMAL LOW (ref 135–145)

## 2015-07-09 LAB — TSH: TSH: 6.58 u[IU]/mL — ABNORMAL HIGH (ref 0.35–4.50)

## 2015-07-09 MED ORDER — LOSARTAN POTASSIUM-HCTZ 100-12.5 MG PO TABS: 1.0000 | ORAL_TABLET | Freq: Every day | ORAL | Status: DC

## 2015-07-09 MED ORDER — PRAVASTATIN SODIUM 20 MG PO TABS: 20.0000 mg | ORAL_TABLET | Freq: Every day | ORAL | Status: DC

## 2015-07-09 MED ORDER — GABAPENTIN 100 MG PO CAPS: ORAL_CAPSULE | ORAL | Status: DC

## 2015-07-09 MED ORDER — CLONIDINE HCL 0.1 MG PO TABS: 0.0500 mg | ORAL_TABLET | Freq: Two times a day (BID) | ORAL | Status: DC

## 2015-07-09 MED ORDER — FLUTICASONE-SALMETEROL 100-50 MCG/DOSE IN AEPB: 1.0000 | INHALATION_SPRAY | Freq: Two times a day (BID) | RESPIRATORY_TRACT | Status: DC

## 2015-07-09 MED ORDER — PANTOPRAZOLE SODIUM 40 MG PO TBEC: 40.0000 mg | DELAYED_RELEASE_TABLET | Freq: Every day | ORAL | Status: DC

## 2015-07-09 MED ORDER — ATENOLOL 25 MG PO TABS: 12.5000 mg | ORAL_TABLET | Freq: Every day | ORAL | Status: DC

## 2015-07-09 NOTE — Progress Notes (Signed)
Pre visit review using our clinic review tool, if applicable. No additional management support is needed unless otherwise documented below in the visit note. 

## 2015-07-09 NOTE — Progress Notes (Signed)
Subjective:    Patient ID: Joann Ray, female    DOB: 27-Dec-1945, 69 y.o.   MRN: [id number]  HPI  69 year old white female with history of chronic fibromyalgia (chronic pain mgt), COPD, hypertension, and hypothyroidism for follow up.  Patient complains of hot flashes over the last 6 months. Her symptoms have been progressively worsening. Her symptoms worse at night. Patient had partial hysterectomy at age 40.  Fibromyalgia/chronic pain-patient switched to Percocet. She takes pain medications every 8 hours. She has "good days and bad days". Her wheezing seems to be less since morphine sulfate was discontinued.  COPD-patient reports using Advair regularly. She denies chronic cough.  Her wheezing has improved.  Hypothyroidism-patient's last TSH obtained in February 2016. Her TSH was elevated at 15.06. She was taking antiacids at the same time as her thyroid replacement medication.  No change in weight.  She takes thyroid medication in the AM on empty stomach.   Review of Systems Negative for wheezing. Negative for shortness of breath    Past Medical History  Diagnosis Date  . Anxiety   . Asthma   . Depression   . Hypertension   . Hypothyroidism   . COPD (chronic obstructive pulmonary disease)   . Fibromyalgia   . Hyperlipidemia   . Arrhythmia      AVNRT  . Anemia   . Migraine     Social History   Social History  . Marital Status: Married    Spouse Name: Sammy  . Number of Children: 3  . Years of Education: 12th/[employer]    Occupational History  . DISABLED    Social History Main Topics  . Smoking status: Former Smoker -- 0.50 packs/day for 40 years    Types: Cigarettes    Quit date: 11/29/2009  . Smokeless tobacco: Never Used     Comment: September 2011, again Oct 2013  . Alcohol Use: No  . Drug Use: No  . Sexual Activity: Not on file   Other Topics Concern  . Not on file   Social History Narrative   She has 1 year old granddaughter-Maggie   Diagnosed with juvenile arthritis   Pt lives at home with spouse.    Caffeine Use:2-3 cups daily.    Past Surgical History  Procedure Laterality Date  . Appendectomy    . Abdominal hysterectomy    . Nasal sinus surgery  1993  . Cardiac electrophysiology mapping and ablation  08/28/2010    EPS/RFA of AVNRT  - Dr. Crissie Sickles  . Coronary stent placement  09/2012  . Unilateral upper extremeity angiogram Left 10/18/2012    Procedure: UNILATERAL UPPER EXTREMEITY ANGIOGRAM;  Surgeon: Sherren Mocha, MD;  Location: [facility] CATH LAB;  Service: Cardiovascular;  Laterality: Left;  . Percutaneous stent intervention Left 10/18/2012    Procedure: PERCUTANEOUS STENT INTERVENTION;  Surgeon: Sherren Mocha, MD;  Location: [facility] CATH LAB;  Service: Cardiovascular;  Laterality: Left;    Family History  Problem Relation Age of Onset  . Colon cancer Sister 65  . Diabetes Mother   . Stroke Mother   . Dementia Mother   . Skin cancer Father   . Breast cancer Paternal Aunt   . Stomach cancer Paternal Aunt   . Stroke Sister     Allergies  Allergen Reactions  . Penicillins Anaphylaxis and Hives    Current Outpatient Prescriptions on File Prior to Visit  Medication Sig Dispense Refill  . acetaminophen (TYLENOL) 500 MG tablet Take 1 tablet (500 mg total) by mouth every  12 (twelve) hours as needed. 30 tablet 0  . aspirin 81 MG chewable tablet Chew 81 mg by mouth daily.    Marland Kitchen atenolol (TENORMIN) 25 MG tablet Take 0.5 tablets (12.5 mg total) by mouth daily. 90 tablet 0  . Cholecalciferol (VITAMIN D) 2000 UNITS CAPS Take 1 capsule (2,000 Units total) by mouth daily. 30 capsule   . clonazePAM (KLONOPIN) 0.5 MG tablet Take 1-2 tabs at bedtime as needed 60 tablet 3  . cloNIDine (CATAPRES) 0.1 MG tablet Take 0.5 tablets (0.05 mg total) by mouth 2 (two) times daily. 180 tablet 1  . Fluticasone-Salmeterol (ADVAIR) 100-50 MCG/DOSE AEPB Inhale 1 puff into the lungs 2 (two) times daily. 1 each 3  .  losartan-hydrochlorothiazide (HYZAAR) 100-12.5 MG per tablet Take 1 tablet by mouth daily. 90 tablet 1  . ondansetron (ZOFRAN) 4 MG tablet TAKE 1 TABLET BY MOUTH EVERY 6 HOURS AS NEEDED FOR NAUSEA AND VOMITING 30 tablet 1  . oxyCODONE-acetaminophen (ROXICET) 5-325 MG per tablet Take 1 tablet by mouth every 8 (eight) hours as needed for severe pain. 90 tablet 0  . pantoprazole (PROTONIX) 40 MG tablet Take 1 tablet (40 mg total) by mouth daily. 90 tablet 1  . pravastatin (PRAVACHOL) 20 MG tablet Take 1 tablet (20 mg total) by mouth daily. 90 tablet 1  . PROAIR HFA 108 (90 BASE) MCG/ACT inhaler INHALE 2 PUFFS INTO THE LUNGS TWICE A DAY 8.5 Inhaler 5  . SYNTHROID 125 MCG tablet Take 1 tablet (125 mcg total) by mouth daily before breakfast. 90 tablet 1   No current facility-administered medications on file prior to visit.    BP 120/72 mmHg  Pulse 51  Temp(Src) 98.1 F (36.7 C) (Oral)  Ht 5\' 2"  (1.575 m)  Wt 118 lb 3.2 oz (53.615 kg)  BMI 21.61 kg/m2    Objective:   Physical Exam  Constitutional: She is oriented to person, place, and time. She appears well-developed and well-nourished. No distress.  HENT:  Head: Normocephalic and atraumatic.  Mouth/Throat: Oropharynx is clear and moist.  Neck: Neck supple.  Cardiovascular: Normal rate, regular rhythm and normal heart sounds.   No murmur heard. Pulmonary/Chest: Effort normal and breath sounds normal. She has no wheezes.  Lymphadenopathy:    She has no cervical adenopathy.  Neurological: She is alert and oriented to person, place, and time. No cranial nerve deficit.  Skin: Skin is warm and dry.  Psychiatric: She has a normal mood and affect. Her behavior is normal.        Assessment & Plan:   1. Hot flashes 2. COPD 3. Chronic pain / fibromyalgia 4. Hypertension  We had discussion about avoiding hormone replacement considering history of hypertension, hyperlipidemia and tobacco use. She also has history of left subclavian  stenosis. Unfortunately she has been unable to tolerate Effexor in the past. Discontinued Lyrica due to side effect of it causing whooshing noise in her ears. Trial of gabapentin 100 mg 1-2 capsules at bedtime.  Her COPD is improved. Continue Advair 1 dose twice daily  Her chronic pain/fibromyalgia symptoms are stable.  We have been unable to taper off narcotics despite referral to pain management.  Continue same dose of Percocet.  BP is stable.  Medications refilled.

## 2015-07-10 ENCOUNTER — Other Ambulatory Visit: Payer: Self-pay | Admitting: *Deleted

## 2015-07-10 ENCOUNTER — Other Ambulatory Visit: Payer: Self-pay | Admitting: Internal Medicine

## 2015-07-10 DIAGNOSIS — E039 Hypothyroidism, unspecified: Secondary | ICD-10-CM

## 2015-07-10 DIAGNOSIS — E871 Hypo-osmolality and hyponatremia: Secondary | ICD-10-CM

## 2015-07-10 MED ORDER — AMLODIPINE BESYLATE 2.5 MG PO TABS: 2.5000 mg | ORAL_TABLET | Freq: Every day | ORAL | Status: DC

## 2015-07-10 MED ORDER — VALSARTAN 160 MG PO TABS: 160.0000 mg | ORAL_TABLET | Freq: Every day | ORAL | Status: DC

## 2015-07-15 ENCOUNTER — Telehealth: Payer: Self-pay | Admitting: *Deleted

## 2015-07-15 MED ORDER — LEVOTHYROXINE SODIUM 137 MCG PO CAPS: 1.0000 | ORAL_CAPSULE | Freq: Every day | ORAL | Status: DC

## 2015-07-15 NOTE — Telephone Encounter (Signed)
Patient is aware of lab results and would like to know if it is okay for her to take generic Synthroid?

## 2015-07-15 NOTE — Telephone Encounter (Signed)
Ok to switch to generic synthroid

## 2015-07-16 ENCOUNTER — Telehealth: Payer: Self-pay | Admitting: Internal Medicine

## 2015-07-16 MED ORDER — LEVOTHYROXINE SODIUM 137 MCG PO TABS: 137.0000 ug | ORAL_TABLET | Freq: Every day | ORAL | Status: DC

## 2015-07-16 NOTE — Telephone Encounter (Signed)
Pharm called b/c pt states she is to be on levothyroxine (SYNTHROID, LEVOTHROID) 137 MCG tablet  But dr Shawna Orleans sent over Levothyroxine Sodium 137 MCG CAPS yesterday. Can you send in the correct med? 30 w/ one  refill  [facility] [place]

## 2015-07-17 NOTE — Telephone Encounter (Signed)
Pt states the pharm filled her levothyroxine (SYNTHROID, LEVOTHROID) 137 MCG   As name brand. Pt has not opened and wants to take baceand get the generic. advised pt we sent generic, and we will check w/ the pharm to ensure they have correct RX

## 2015-07-21 ENCOUNTER — Other Ambulatory Visit: Payer: Medicare Other

## 2015-07-30 ENCOUNTER — Telehealth: Payer: Self-pay | Admitting: Internal Medicine

## 2015-07-30 NOTE — Telephone Encounter (Signed)
Pt request refill of the following: oxyCODONE-acetaminophen (ROXICET) 5-325 MG per tablet ° ° °Phamacy: ° °

## 2015-07-30 NOTE — Telephone Encounter (Signed)
Ok to RF x 2.  She will need UDS.  Please see if Dr Raliegh Ip will sign

## 2015-07-31 MED ORDER — OXYCODONE-ACETAMINOPHEN 5-325 MG PO TABS: 1.0000 | ORAL_TABLET | Freq: Three times a day (TID) | ORAL | Status: DC | PRN

## 2015-07-31 NOTE — Telephone Encounter (Signed)
Patient is aware the rx is ready and she should stop by the lab

## 2015-08-05 ENCOUNTER — Other Ambulatory Visit (INDEPENDENT_AMBULATORY_CARE_PROVIDER_SITE_OTHER): Payer: Medicare Other

## 2015-08-05 DIAGNOSIS — E559 Vitamin D deficiency, unspecified: Secondary | ICD-10-CM | POA: Diagnosis not present

## 2015-08-05 DIAGNOSIS — E871 Hypo-osmolality and hyponatremia: Secondary | ICD-10-CM | POA: Diagnosis not present

## 2015-08-05 DIAGNOSIS — E039 Hypothyroidism, unspecified: Secondary | ICD-10-CM

## 2015-08-05 LAB — BASIC METABOLIC PANEL
BUN: 13 mg/dL (ref 6–23)
CO2: 26 mEq/L (ref 19–32)
CREATININE: 0.72 mg/dL (ref 0.40–1.20)
Calcium: 9.6 mg/dL (ref 8.4–10.5)
Chloride: 101 mEq/L (ref 96–112)
GFR: 85.87 mL/min (ref >60.00)
Glucose, Bld: 120 mg/dL — ABNORMAL HIGH (ref 70–99)
Potassium: 5.4 mEq/L — ABNORMAL HIGH (ref 3.5–5.1)
Sodium: 137 mEq/L (ref 135–145)

## 2015-08-05 LAB — TSH: TSH: 0.19 u[IU]/mL — AB (ref 0.35–4.50)

## 2015-08-06 ENCOUNTER — Other Ambulatory Visit: Payer: Self-pay | Admitting: *Deleted

## 2015-08-06 MED ORDER — LEVOTHYROXINE SODIUM 125 MCG PO TABS: 125.0000 ug | ORAL_TABLET | Freq: Every day | ORAL | Status: DC

## 2015-08-08 LAB — VITAMIN D 1,25 DIHYDROXY
Vitamin D 1, 25 (OH)2 Total: 54 pg/mL (ref 18–72)
Vitamin D2 1, 25 (OH)2: 15 pg/mL
Vitamin D3 1, 25 (OH)2: 39 pg/mL

## 2015-08-11 ENCOUNTER — Ambulatory Visit: Payer: Medicare Other | Admitting: Internal Medicine

## 2015-08-27 ENCOUNTER — Telehealth: Payer: Self-pay | Admitting: Internal Medicine

## 2015-08-27 NOTE — Telephone Encounter (Signed)
Pt request refill oxyCODONE-acetaminophen (ROXICET) 5-325 MG per tablet

## 2015-08-27 NOTE — Telephone Encounter (Signed)
Ok to RF x 1.  I suggest she see another provider next month

## 2015-08-28 MED ORDER — OXYCODONE-ACETAMINOPHEN 5-325 MG PO TABS: 1.0000 | ORAL_TABLET | Freq: Three times a day (TID) | ORAL | Status: DC | PRN

## 2015-08-28 NOTE — Telephone Encounter (Signed)
Rx ready for pick up and patient is aware.  Patient will call back to schedule an appointment.

## 2015-09-03 ENCOUNTER — Other Ambulatory Visit: Payer: Self-pay | Admitting: Internal Medicine

## 2015-09-08 ENCOUNTER — Ambulatory Visit: Payer: Medicare Other | Admitting: Internal Medicine

## 2015-09-08 ENCOUNTER — Other Ambulatory Visit: Payer: Medicare Other

## 2015-09-13 ENCOUNTER — Other Ambulatory Visit: Payer: Self-pay | Admitting: Internal Medicine

## 2015-09-16 ENCOUNTER — Other Ambulatory Visit: Payer: Self-pay | Admitting: Internal Medicine

## 2015-09-17 ENCOUNTER — Ambulatory Visit: Payer: Medicare Other | Admitting: Internal Medicine

## 2015-09-23 ENCOUNTER — Other Ambulatory Visit: Payer: Medicare Other

## 2015-09-29 ENCOUNTER — Ambulatory Visit: Payer: Medicare Other | Admitting: Internal Medicine

## 2015-10-13 ENCOUNTER — Telehealth: Payer: Self-pay | Admitting: Internal Medicine

## 2015-10-13 MED ORDER — ALBUTEROL SULFATE HFA 108 (90 BASE) MCG/ACT IN AERS: INHALATION_SPRAY | RESPIRATORY_TRACT | Status: DC

## 2015-10-13 NOTE — Telephone Encounter (Signed)
Pt request refill of the following: PROAIR HFA 108 (90 BASE) MCG/ACT inhaler  Pt IS Tranferring to [facility] and need a refill until she see them   Phamacy:  [facility]

## 2015-10-13 NOTE — Telephone Encounter (Signed)
Rx sent 

## 2016-01-07 ENCOUNTER — Ambulatory Visit: Payer: Medicare Other | Admitting: Internal Medicine

## 2016-01-08 ENCOUNTER — Telehealth: Payer: Self-pay | Admitting: Internal Medicine

## 2016-01-08 ENCOUNTER — Other Ambulatory Visit: Payer: Self-pay | Admitting: Internal Medicine

## 2016-01-08 NOTE — Telephone Encounter (Signed)
Ok to RF lyrica 25 mg po qhs  #30  RFx2

## 2016-01-08 NOTE — Telephone Encounter (Signed)
Patient want to go back [place] the [place]  [facility] [place], [phone]

## 2016-01-09 MED ORDER — PREGABALIN 25 MG PO CAPS: 25.0000 mg | ORAL_CAPSULE | Freq: Every day | ORAL | Status: DC

## 2016-01-09 NOTE — Telephone Encounter (Signed)
rx called in

## 2016-03-02 DIAGNOSIS — K589 Irritable bowel syndrome without diarrhea: Secondary | ICD-10-CM | POA: Insufficient documentation

## 2016-03-02 DIAGNOSIS — G9332 Myalgic encephalomyelitis/chronic fatigue syndrome: Secondary | ICD-10-CM | POA: Insufficient documentation

## 2016-03-02 DIAGNOSIS — R5382 Chronic fatigue, unspecified: Secondary | ICD-10-CM | POA: Insufficient documentation

## 2016-03-02 DIAGNOSIS — F4323 Adjustment disorder with mixed anxiety and depressed mood: Secondary | ICD-10-CM | POA: Insufficient documentation

## 2016-03-02 DIAGNOSIS — E7801 Familial hypercholesterolemia: Secondary | ICD-10-CM | POA: Insufficient documentation

## 2016-03-02 DIAGNOSIS — J449 Chronic obstructive pulmonary disease, unspecified: Secondary | ICD-10-CM | POA: Insufficient documentation

## 2016-03-02 DIAGNOSIS — E78019 Familial hypercholesterolemia, unspecified: Secondary | ICD-10-CM | POA: Insufficient documentation

## 2016-03-02 DIAGNOSIS — J45909 Unspecified asthma, uncomplicated: Secondary | ICD-10-CM | POA: Insufficient documentation

## 2016-03-02 DIAGNOSIS — G43909 Migraine, unspecified, not intractable, without status migrainosus: Secondary | ICD-10-CM | POA: Insufficient documentation

## 2016-03-03 DIAGNOSIS — J4540 Moderate persistent asthma, uncomplicated: Secondary | ICD-10-CM | POA: Diagnosis not present

## 2016-03-03 DIAGNOSIS — J439 Emphysema, unspecified: Secondary | ICD-10-CM | POA: Diagnosis not present

## 2016-03-03 DIAGNOSIS — E039 Hypothyroidism, unspecified: Secondary | ICD-10-CM | POA: Diagnosis not present

## 2016-03-03 DIAGNOSIS — I10 Essential (primary) hypertension: Secondary | ICD-10-CM | POA: Diagnosis not present

## 2016-03-30 ENCOUNTER — Encounter: Payer: Self-pay | Admitting: Gastroenterology

## 2016-04-27 ENCOUNTER — Other Ambulatory Visit: Payer: Self-pay | Admitting: Cardiovascular Disease

## 2016-04-27 DIAGNOSIS — I6523 Occlusion and stenosis of bilateral carotid arteries: Secondary | ICD-10-CM

## 2016-04-29 ENCOUNTER — Ambulatory Visit (HOSPITAL_COMMUNITY)
Admission: RE | Admit: 2016-04-29 | Discharge: 2016-04-29 | Disposition: A | Discharge status: Home / Self Care | Payer: Medicare Other | Source: Ambulatory Visit | Attending: Cardiovascular Disease | Admitting: Cardiovascular Disease

## 2016-04-29 DIAGNOSIS — F329 Major depressive disorder, single episode, unspecified: Secondary | ICD-10-CM | POA: Insufficient documentation

## 2016-04-29 DIAGNOSIS — I6523 Occlusion and stenosis of bilateral carotid arteries: Secondary | ICD-10-CM | POA: Diagnosis not present

## 2016-04-29 DIAGNOSIS — F419 Anxiety disorder, unspecified: Secondary | ICD-10-CM | POA: Diagnosis not present

## 2016-04-29 DIAGNOSIS — E785 Hyperlipidemia, unspecified: Secondary | ICD-10-CM | POA: Insufficient documentation

## 2016-04-29 DIAGNOSIS — I10 Essential (primary) hypertension: Secondary | ICD-10-CM | POA: Diagnosis not present

## 2016-05-10 ENCOUNTER — Ambulatory Visit: Payer: Medicare Other | Admitting: Cardiovascular Disease

## 2016-05-17 ENCOUNTER — Encounter: Payer: Self-pay | Admitting: Cardiovascular Disease

## 2016-05-17 ENCOUNTER — Ambulatory Visit (INDEPENDENT_AMBULATORY_CARE_PROVIDER_SITE_OTHER): Payer: Medicare Other | Admitting: Cardiovascular Disease

## 2016-05-17 VITALS — BP 152/70 | HR 43 | Ht 62.0 in | Wt 128.0 lb

## 2016-05-17 DIAGNOSIS — R222 Localized swelling, mass and lump, trunk: Secondary | ICD-10-CM

## 2016-05-17 DIAGNOSIS — I708 Atherosclerosis of other arteries: Secondary | ICD-10-CM | POA: Diagnosis not present

## 2016-05-17 DIAGNOSIS — I771 Stricture of artery: Secondary | ICD-10-CM

## 2016-05-17 NOTE — Patient Instructions (Addendum)
Medication Instructions:  Your physician has recommended you make the following change in your medication:  1. STOP Atenolol  Labwork: Your physician recommends that you have lab work: BMP (pt is having labs with PCP Dr Kathryne Eriksson tomorrow and fax number provided)   Testing/Procedures: Non-Cardiac CT Angiography (CTA), is a special type of CT scan that uses a computer to produce multi-dimensional views of major blood vessels throughout the body. In CT angiography, a contrast material is injected through an IV to help visualize the blood vessels (CTA Chest)  Follow-Up: Your physician wants you to follow-up in: 1 YEAR with Dr Burt Knack.  You will receive a reminder letter in the mail two months in advance. If you don't receive a letter, please call our office to schedule the follow-up appointment.   Any Other Special Instructions Will Be Listed Below (If Applicable).     If you need a refill on your cardiac medications before your next appointment, please call your pharmacy.

## 2016-05-17 NOTE — Progress Notes (Signed)
Cardiology Office Note Date:  05/17/2016   ID:  Joann Ray, DOB 09/07/1946, MRN [id number]  PCP:  Woody Seller, MD  Cardiologist:  Sherren Mocha, MD    Chief Complaint  Patient presents with  . Follow-up    Subclavian Stenosis     History of Present Illness: Joann Ray is a 70 y.o. female who presents for follow-up of peripheral arterial disease. 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 She also has carotid and lower extremity PAD. She has no history of stroke or TIA.  She's been under a lot of stress over the past several months. Her husband has been diagnosed with throat cancer and is undergone radiation therapy. She complains of fatigue, headache, and left chest/neck swelling. She denies chest pain or pressure. The patient has long-standing fibromyalgia/chronic pain.   Past Medical History  Diagnosis Date  . Anxiety   . Asthma   . Depression   . Hypertension   . Hypothyroidism   . COPD (chronic obstructive pulmonary disease) ([place])   . Fibromyalgia   . Hyperlipidemia   . Arrhythmia      AVNRT  . Anemia   . Migraine     Past Surgical History  Procedure Laterality Date  . Appendectomy    . Abdominal hysterectomy    . Nasal sinus surgery  1993  . Cardiac electrophysiology mapping and ablation  08/28/2010    EPS/RFA of AVNRT  - Dr. Crissie Sickles  . Coronary stent placement  09/2012  . Unilateral upper extremeity angiogram Left 10/18/2012    Procedure: UNILATERAL UPPER EXTREMEITY ANGIOGRAM;  Surgeon: Sherren Mocha, MD;  Location: [facility] CATH LAB;  Service: Cardiovascular;  Laterality: Left;  . Percutaneous stent intervention Left 10/18/2012    Procedure: PERCUTANEOUS STENT INTERVENTION;  Surgeon: Sherren Mocha, MD;  Location: [facility] CATH LAB;  Service: Cardiovascular;  Laterality: Left;    Current Outpatient Prescriptions  Medication Sig Dispense Refill  . acetaminophen (TYLENOL) 500 MG tablet  Take 1 tablet (500 mg total) by mouth every 12 (twelve) hours as needed. 30 tablet 0  . albuterol (PROAIR HFA) 108 (90 BASE) MCG/ACT inhaler INHALE 2 PUFFS INTO THE LUNGS TWICE A DAY 8.5 Inhaler 5  . amLODipine (NORVASC) 2.5 MG tablet TAKE 1 TABLET (2.5 MG TOTAL) BY MOUTH DAILY. 30 tablet 5  . aspirin 81 MG chewable tablet Chew 81 mg by mouth daily.    . Cholecalciferol (VITAMIN D) 2000 UNITS CAPS Take 1 capsule (2,000 Units total) by mouth daily. 30 capsule   . clonazePAM (KLONOPIN) 0.5 MG tablet TAKE 1-2 TABLETS BY MOUTH AT BEDTIME AS NEEDED 60 tablet 5  . cloNIDine (CATAPRES) 0.1 MG tablet Take 0.5 tablets (0.05 mg total) by mouth 2 (two) times daily. 180 tablet 1  . Fluticasone-Salmeterol (ADVAIR) 100-50 MCG/DOSE AEPB Inhale 1 puff into the lungs 2 (two) times daily. 1 each 5  . gabapentin (NEURONTIN) 100 MG capsule TAKE ONE TO TWO CAPSULES AT BEDTIME FOR HOT FLASHES 60 capsule 1  . HYDROcodone-acetaminophen (NORCO) 7.5-325 MG tablet Take 1 tablet by mouth 3 (three) times daily.    Marland Kitchen levothyroxine (SYNTHROID, LEVOTHROID) 125 MCG tablet Take 1 tablet (125 mcg total) by mouth daily. 30 tablet 1  . levothyroxine (SYNTHROID, LEVOTHROID) 137 MCG tablet TAKE 1 TABLET (137 MCG TOTAL) BY MOUTH DAILY BEFORE BREAKFAST. 30 tablet 5  . ondansetron (ZOFRAN) 4 MG tablet TAKE 1 TABLET BY MOUTH EVERY 6 HOURS AS NEEDED FOR NAUSEA AND VOMITING  30 tablet 1  . pantoprazole (PROTONIX) 40 MG tablet Take 1 tablet (40 mg total) by mouth daily. 90 tablet 1  . pravastatin (PRAVACHOL) 20 MG tablet Take 1 tablet (20 mg total) by mouth daily. 90 tablet 1  . pregabalin (LYRICA) 25 MG capsule Take 1 capsule (25 mg total) by mouth at bedtime. 30 capsule 2  . valsartan (DIOVAN) 160 MG tablet TAKE 1 TABLET (160 MG TOTAL) BY MOUTH DAILY. 30 tablet 5   No current facility-administered medications for this visit.    Allergies:   Penicillins; Levothyroxine; and Diazepam   Social History:  The patient  reports that she quit  smoking about 6 years ago. Her smoking use included Cigarettes. She has a 20 pack-year smoking history. She has never used smokeless tobacco. She reports that she does not drink alcohol or use illicit drugs.   Family History:  The patient's family history includes Breast cancer in her paternal aunt; Colon cancer (age of onset: 15) in her sister; Dementia in her mother; Diabetes in her mother; Skin cancer in her father; Stomach cancer in her paternal aunt; Stroke in her mother and sister.    ROS:  Please see the history of present illness.  Otherwise, review of systems is positive for back pain, muscle pain, dizziness, fatigue, snoring, wheezing, anxiety, headaches.  All other systems are reviewed and negative.    PHYSICAL EXAM: VS:  BP 152/70 mmHg  Pulse 43  Ht 5\' 2"  (1.575 m)  Wt 128 lb (58.06 kg)  BMI 23.41 kg/m2  SpO2 98% , BMI Body mass index is 23.41 kg/(m^2). GEN: Well nourished, well developed, in no acute distress HEENT: normal Neck: no JVD, no masses. Bilateral carotid bruits left > right Cardiac: bradycardic and regualr without murmur or gallop    Respiratory:  clear to auscultation bilaterally, normal work of breathing GI: soft, nontender, nondistended, + BS MS: no deformity or atrophy Ext: no pretibial edema Skin: warm and dry, no rash Neuro:  Strength and sensation are intact Psych: euthymic mood, full affect  EKG:  EKG is ordered today. The ekg ordered today shows marked sinus bradycardia 43 bpm, nonspecific ST and T wave abnormality  Recent Labs: 08/05/2015: BUN 13; Creatinine, Ser 0.72; Potassium 5.4*; Sodium 137; TSH 0.19*   Lipid Panel     Component Value Date/Time   CHOL 245* 01/09/2015 0939   TRIG 100.0 01/09/2015 0939   HDL 56.50 01/09/2015 0939   CHOLHDL 4 01/09/2015 0939   VLDL 20.0 01/09/2015 0939   LDLCALC 169* 01/09/2015 0939   LDLDIRECT 135.9 10/02/2013 0949      Wt Readings from Last 3 Encounters:  05/17/16 128 lb (58.06 kg)  07/09/15 118 lb  3.2 oz (53.615 kg)  05/05/15 118 lb (53.524 kg)    Cardiac Studies Reviewed: Carotid Duplex 2016-05-24: Impressions Duplex imaging, with color Doppler, of the carotid arteries reveals heterogeneous plaque with shadowing in both bifurcations and ICA's. Bilateral ICA velocities are elevated and stable. The subclavian arteries are widely patent, with normal velocity flow, bilaterally, s/p left subclavian stent placement. The vertebral arteries are patent with antegrade flow, bilaterally. Duplex imaging of the brachial artery demonstrates biphasic waveforms, bilaterally. Technologist Notes Plaque Plaque Examination Data cm/s cm/s Heterogeneous plaque, bilaterally. Stable 40-59% RICA stenosis. Stable LICA velocities, now in 40-59% range. Normal subclavian arteries, bilaterally, s/p left subclavian stent placement. Patent vertebral arteries with antegrade flow. F/U 1 year.  ASSESSMENT AND PLAN: 1.  Left subclavian stenosis s/p PTA/stenting. The patient has elevated  subclavian velocities but a normal pulse exam. She also has supraclavicular swelling on exam. I suspect this is soft tissue swelling, but considering her history I've recommended a CTA of the chest with focus on the left subclavian artery to rule out aneurysm or other abnormality.  2. HTN: BP medications reviewed and will be continued without change.   3. Hyperlipidemia: pravstatin.  4. Sinus bradycardia: atenolol is removed from her medicine list. Appears asymptomatic.  Current medicines are reviewed with the patient today.  The patient does not have concerns regarding medicines.  Labs/ tests ordered today include:   Orders Placed This Encounter  Procedures  . CT Angio Chest W/Cm &/Or Wo Cm  . Basic Metabolic Panel (BMET)    Disposition:   FU one year  Signed, Sherren Mocha, MD  05/17/2016 5:40 PM    [facility] [place], [place], [place]  [address] Phone: [phone]; Fax: [phone]

## 2016-05-19 NOTE — Addendum Note (Signed)
Addended by: Freada Bergeron on: 05/19/2016 01:24 PM   Modules accepted: Orders

## 2016-05-27 ENCOUNTER — Other Ambulatory Visit: Payer: Medicare Other

## 2016-05-27 DIAGNOSIS — K589 Irritable bowel syndrome without diarrhea: Secondary | ICD-10-CM | POA: Diagnosis not present

## 2016-05-27 DIAGNOSIS — I739 Peripheral vascular disease, unspecified: Secondary | ICD-10-CM | POA: Diagnosis not present

## 2016-05-27 DIAGNOSIS — I10 Essential (primary) hypertension: Secondary | ICD-10-CM | POA: Diagnosis not present

## 2016-05-27 DIAGNOSIS — M1711 Unilateral primary osteoarthritis, right knee: Secondary | ICD-10-CM | POA: Insufficient documentation

## 2016-05-27 DIAGNOSIS — E039 Hypothyroidism, unspecified: Secondary | ICD-10-CM | POA: Diagnosis not present

## 2016-05-31 ENCOUNTER — Other Ambulatory Visit (INDEPENDENT_AMBULATORY_CARE_PROVIDER_SITE_OTHER): Payer: Medicare Other | Admitting: *Deleted

## 2016-05-31 DIAGNOSIS — R222 Localized swelling, mass and lump, trunk: Secondary | ICD-10-CM

## 2016-05-31 DIAGNOSIS — I708 Atherosclerosis of other arteries: Secondary | ICD-10-CM | POA: Diagnosis not present

## 2016-05-31 DIAGNOSIS — I771 Stricture of artery: Secondary | ICD-10-CM

## 2016-05-31 LAB — BASIC METABOLIC PANEL
BUN: 13 mg/dL (ref 7–25)
CHLORIDE: 105 mmol/L (ref 98–110)
CO2: 25 mmol/L (ref 20–31)
CREATININE: 0.89 mg/dL (ref 0.50–0.99)
Calcium: 8.7 mg/dL (ref 8.6–10.4)
GLUCOSE: 103 mg/dL — AB (ref 65–99)
Potassium: 4.8 mmol/L (ref 3.5–5.3)
Sodium: 139 mmol/L (ref 135–146)

## 2016-05-31 LAB — T4, FREE: FREE T4: 1.1 ng/dL (ref 0.8–1.8)

## 2016-05-31 LAB — TSH: TSH: 10.39 mIU/L — ABNORMAL HIGH

## 2016-05-31 NOTE — Addendum Note (Signed)
Addended by: Eulis Foster on: 05/31/2016 10:54 AM   Modules accepted: Orders

## 2016-05-31 NOTE — Addendum Note (Signed)
Addended by: Eulis Foster on: 05/31/2016 10:57 AM   Modules accepted: Orders

## 2016-05-31 NOTE — Addendum Note (Signed)
Addended by: Eulis Foster on: 05/31/2016 10:42 AM   Modules accepted: Orders

## 2016-06-03 ENCOUNTER — Other Ambulatory Visit: Payer: Self-pay

## 2016-06-03 ENCOUNTER — Encounter: Payer: Self-pay | Admitting: Cardiovascular Disease

## 2016-06-03 NOTE — Telephone Encounter (Signed)
Fu  Pt returning RN phone call- lab results. Please call back and discuss.   

## 2016-06-03 NOTE — Telephone Encounter (Signed)
This encounter was created in error - please disregard.

## 2016-06-07 ENCOUNTER — Ambulatory Visit
Admission: RE | Admit: 2016-06-07 | Discharge: 2016-06-07 | Disposition: A | Discharge status: Home / Self Care | Payer: Medicare Other | Source: Ambulatory Visit | Attending: Cardiovascular Disease | Admitting: Cardiovascular Disease

## 2016-06-07 DIAGNOSIS — I771 Stricture of artery: Secondary | ICD-10-CM

## 2016-06-07 DIAGNOSIS — R222 Localized swelling, mass and lump, trunk: Secondary | ICD-10-CM

## 2016-06-11 ENCOUNTER — Other Ambulatory Visit: Payer: Medicare Other

## 2016-06-16 DIAGNOSIS — F5102 Adjustment insomnia: Secondary | ICD-10-CM | POA: Diagnosis not present

## 2016-06-16 DIAGNOSIS — E039 Hypothyroidism, unspecified: Secondary | ICD-10-CM | POA: Diagnosis not present

## 2016-06-16 DIAGNOSIS — F4323 Adjustment disorder with mixed anxiety and depressed mood: Secondary | ICD-10-CM | POA: Diagnosis not present

## 2016-06-16 DIAGNOSIS — M1711 Unilateral primary osteoarthritis, right knee: Secondary | ICD-10-CM | POA: Diagnosis not present

## 2016-06-16 DIAGNOSIS — F419 Anxiety disorder, unspecified: Secondary | ICD-10-CM | POA: Diagnosis not present

## 2016-07-07 DIAGNOSIS — M25561 Pain in right knee: Secondary | ICD-10-CM | POA: Diagnosis not present

## 2016-07-07 DIAGNOSIS — M1711 Unilateral primary osteoarthritis, right knee: Secondary | ICD-10-CM | POA: Diagnosis not present

## 2016-07-21 ENCOUNTER — Other Ambulatory Visit: Payer: Self-pay | Admitting: Family Medicine

## 2016-07-21 ENCOUNTER — Telehealth: Payer: Self-pay | Admitting: Cardiovascular Disease

## 2016-07-21 DIAGNOSIS — Z1231 Encounter for screening mammogram for malignant neoplasm of breast: Secondary | ICD-10-CM

## 2016-07-26 ENCOUNTER — Ambulatory Visit (HOSPITAL_COMMUNITY)
Admission: RE | Admit: 2016-07-26 | Discharge: 2016-07-26 | Disposition: A | Discharge status: Home / Self Care | Payer: Medicare Other | Source: Ambulatory Visit | Attending: Cardiovascular Disease | Admitting: Cardiovascular Disease

## 2016-07-26 DIAGNOSIS — R222 Localized swelling, mass and lump, trunk: Secondary | ICD-10-CM | POA: Diagnosis not present

## 2016-07-26 LAB — POCT I-STAT CREATININE: CREATININE: 0.7 mg/dL (ref 0.44–1.00)

## 2016-07-27 ENCOUNTER — Ambulatory Visit: Payer: Medicare Other

## 2016-07-27 ENCOUNTER — Other Ambulatory Visit: Payer: Self-pay | Admitting: Cardiovascular Disease

## 2016-07-27 DIAGNOSIS — I708 Atherosclerosis of other arteries: Secondary | ICD-10-CM

## 2016-07-27 DIAGNOSIS — I7090 Unspecified atherosclerosis: Secondary | ICD-10-CM

## 2016-07-29 ENCOUNTER — Encounter (HOSPITAL_COMMUNITY): Payer: Self-pay

## 2016-07-29 ENCOUNTER — Ambulatory Visit (HOSPITAL_COMMUNITY)
Admission: RE | Admit: 2016-07-29 | Discharge: 2016-07-29 | Disposition: A | Discharge status: Home / Self Care | Payer: Medicare Other | Source: Ambulatory Visit | Attending: Cardiovascular Disease | Admitting: Cardiovascular Disease

## 2016-07-29 ENCOUNTER — Other Ambulatory Visit: Payer: Self-pay | Admitting: Cardiovascular Disease

## 2016-07-29 ENCOUNTER — Encounter (HOSPITAL_COMMUNITY): Payer: Self-pay | Admitting: Interventional Radiology

## 2016-07-29 DIAGNOSIS — R222 Localized swelling, mass and lump, trunk: Secondary | ICD-10-CM

## 2016-07-29 DIAGNOSIS — J449 Chronic obstructive pulmonary disease, unspecified: Secondary | ICD-10-CM | POA: Insufficient documentation

## 2016-07-29 DIAGNOSIS — I708 Atherosclerosis of other arteries: Secondary | ICD-10-CM

## 2016-07-29 DIAGNOSIS — I7091 Generalized atherosclerosis: Secondary | ICD-10-CM | POA: Diagnosis not present

## 2016-07-29 DIAGNOSIS — R52 Pain, unspecified: Secondary | ICD-10-CM

## 2016-07-29 DIAGNOSIS — R079 Chest pain, unspecified: Secondary | ICD-10-CM | POA: Diagnosis not present

## 2016-07-29 DIAGNOSIS — I871 Compression of vein: Secondary | ICD-10-CM | POA: Diagnosis not present

## 2016-07-29 DIAGNOSIS — I7090 Unspecified atherosclerosis: Secondary | ICD-10-CM

## 2016-07-29 DIAGNOSIS — Z95828 Presence of other vascular implants and grafts: Secondary | ICD-10-CM | POA: Diagnosis not present

## 2016-07-29 HISTORY — PX: IR GENERIC HISTORICAL: IMG1180011

## 2016-07-29 MED ORDER — LIDOCAINE HCL 1 % IJ SOLN
INTRAMUSCULAR | Status: AC
  Administered 2016-07-29: 2 mL
  Filled 2016-07-29: qty 20

## 2016-07-29 MED ORDER — IOPAMIDOL (ISOVUE-370) INJECTION 76%
INTRAVENOUS | Status: AC
  Administered 2016-07-29: 80 mL
  Filled 2016-07-29: qty 100

## 2016-07-29 MED ORDER — IOPAMIDOL (ISOVUE-370) INJECTION 76%
50.0000 mL | Freq: Once | INTRAVENOUS | Status: AC | PRN
  Administered 2016-07-29: 80 mL via INTRAVENOUS

## 2016-07-29 NOTE — Procedures (Signed)
Interventional Radiology Procedure Note  Procedure: US guided IV for CT. Right basilic vein.  Recommendations:  - Ok to use  - Routine care   Signed,  Dulcy Fanny. Earleen Newport, DO

## 2016-08-04 ENCOUNTER — Ambulatory Visit
Admission: RE | Admit: 2016-08-04 | Discharge: 2016-08-04 | Disposition: A | Discharge status: Home / Self Care | Payer: Medicare Other | Source: Ambulatory Visit | Attending: Family Medicine | Admitting: Family Medicine

## 2016-08-04 DIAGNOSIS — Z1231 Encounter for screening mammogram for malignant neoplasm of breast: Secondary | ICD-10-CM

## 2016-08-06 NOTE — Telephone Encounter (Signed)
Please close Encounter °

## 2016-08-16 DIAGNOSIS — I10 Essential (primary) hypertension: Secondary | ICD-10-CM | POA: Diagnosis not present

## 2016-08-16 DIAGNOSIS — G894 Chronic pain syndrome: Secondary | ICD-10-CM | POA: Diagnosis not present

## 2016-08-16 DIAGNOSIS — E039 Hypothyroidism, unspecified: Secondary | ICD-10-CM | POA: Diagnosis not present

## 2016-09-06 DIAGNOSIS — K589 Irritable bowel syndrome without diarrhea: Secondary | ICD-10-CM | POA: Diagnosis not present

## 2016-09-06 DIAGNOSIS — N951 Menopausal and female climacteric states: Secondary | ICD-10-CM | POA: Diagnosis not present

## 2016-09-06 DIAGNOSIS — G894 Chronic pain syndrome: Secondary | ICD-10-CM | POA: Diagnosis not present

## 2016-09-20 ENCOUNTER — Encounter (INDEPENDENT_AMBULATORY_CARE_PROVIDER_SITE_OTHER): Payer: Self-pay | Admitting: Orthopaedic Surgery

## 2016-09-20 ENCOUNTER — Ambulatory Visit (INDEPENDENT_AMBULATORY_CARE_PROVIDER_SITE_OTHER): Payer: Medicare Other | Admitting: Orthopaedic Surgery

## 2016-09-20 DIAGNOSIS — G8929 Other chronic pain: Secondary | ICD-10-CM | POA: Diagnosis not present

## 2016-09-20 DIAGNOSIS — M25561 Pain in right knee: Secondary | ICD-10-CM | POA: Diagnosis not present

## 2016-09-20 NOTE — Progress Notes (Addendum)
Office Visit Note   Patient: Joann Ray           Date of Birth: 19-Jan-1946           MRN: [id number] Visit Date: 09/20/2016              Requested by: Christain Sacramento, MD [address] [place], [place] [address] PCP: Woody Seller, MD   Assessment & Plan: Visit Diagnoses:  1. Chronic pain of right knee     Plan: I spoke to her in length and discussed the risks and benefits of surgery.  Given her right knee severe medial compartment OA, I have recommended a right partial knee replacement.  I showed her a knee model as well.  She understands this fully and wishes to proceed with surgery in early December.  Will see her two weeks post-opertive.  Follow-Up Instructions: Return if symptoms worsen or fail to improve.   Orders:  No orders of the defined types were placed in this encounter.  Meds ordered this encounter  Medications  . dicyclomine (BENTYL) 10 MG capsule    Sig: Take one capsule 3 times a day as needed for cramps.  Marland Kitchen atenolol (TENORMIN) 25 MG tablet    Sig: Take one tablet twice a day as needed for rapid heart rate.  Marland Kitchen PREMARIN 0.45 MG tablet  . DISCONTD: HYDROcodone-acetaminophen (NORCO/VICODIN) 5-325 MG tablet  . DISCONTD: levothyroxine (SYNTHROID, LEVOTHROID) 150 MCG tablet  . DISCONTD: LORazepam (ATIVAN) 0.5 MG tablet    Sig: TAKE 1 TABLET BY MOUTH EVERY 6 HOURS AS NEEDED FOR ANXIETY FOR UP TO 10 DAYS    Refill:  1  . DISCONTD: LYRICA 50 MG capsule  . DISCONTD: amLODipine (NORVASC) 2.5 MG tablet    Sig: TAKE 1 TABLET (2.5 MG TOTAL) BY MOUTH DAILY.  Marland Kitchen DISCONTD: aspirin 81 MG chewable tablet    Sig: Chew by mouth.  . clonazePAM (KLONOPIN) 0.5 MG tablet    Sig: Take one or 2 tablets twice a day for anxiety.  Marland Kitchen DISCONTD: ondansetron (ZOFRAN) 4 MG tablet    Sig: Take 1/2 to one tablet every 4 hours if needed for nausea.  Marland Kitchen DISCONTD: levothyroxine (SYNTHROID, LEVOTHROID) 150 MCG tablet    Sig: Take by mouth.  . pantoprazole (PROTONIX) 40 MG tablet      Sig: Take 40 mg by mouth.  . DISCONTD: pravastatin (PRAVACHOL) 20 MG tablet    Sig: Take 20 mg by mouth.  . DISCONTD: pregabalin (LYRICA) 50 MG capsule    Sig: Take one capsule twice a day for fibromyalgia pain.  Marland Kitchen DISCONTD: albuterol (PROAIR HFA) 108 (90 Base) MCG/ACT inhaler    Sig: INHALE 2 PUFFS INTO THE LUNGS TWICE A DAY  . DISCONTD: valsartan (DIOVAN) 160 MG tablet    Sig: TAKE 1 TABLET (160 MG TOTAL) BY MOUTH DAILY.      Procedures: No procedures performed   Clinical Data: No additional findings.   Subjective: Chief Complaint  Patient presents with  . Right Knee - Pain    Wants to discuss right knee replacement. Also wants to discuss left shin area pain  . Left Leg - Pain    Left shin pain wants to discuss x1 year No known injury    Still with severe right knee medial pain.  We did try a steroid injection which help somewhat.  She does have x-rays we reviewed at her last visit showing several right knee medial compartment OA with "bone-on-bone" wear (  joint space narrowing), periarticular osteophytes, and sclerotic changes Here pain is 10 out of ten and detrimentally effects her mobility, her ADL's and her quality of life.  She wishes to discuss knee replacement surgery today. HPI  Review of Systems   Objective: Vital Signs: There were no vitals taken for this visit.  Physical Exam  Constitutional: She appears well-developed and well-nourished.  HENT:  Head: Normocephalic and atraumatic.  Eyes: EOM are normal. Pupils are equal, round, and reactive to light.  Neck: Normal range of motion. Neck supple.  Cardiovascular: Normal rate.   Pulmonary/Chest: Effort normal.  Abdominal: Soft.  Musculoskeletal:       Right knee: She exhibits decreased range of motion, swelling and effusion. Tenderness found. Medial joint line tenderness noted.  Neurological: She is alert.  Skin: Skin is warm and dry.    Right Knee Exam   Other  Other tests: effusion  present      Specialty Comments:  No specialty comments available.  Imaging: No results found.   PMFS History: Patient Active Problem List   Diagnosis Date Noted  . Acute bronchitis 03/14/2015  . Vitamin D deficiency 01/15/2015  . Preventative health care 09/25/2014  . Stress reaction causing mixed disturbance of emotion and conduct 07/05/2014  . Headache 07/05/2014  . Chronic back pain 02/13/2014  . Other malaise and fatigue 05/03/2013  . Lumbago 02/14/2013  . PVD (peripheral vascular disease) ([place]) 08/16/2012  . LLQ pain 08/16/2012  . Menopause syndrome 02/28/2012  . Oral cavity pain 11/01/2011  . Chronic pain 07/27/2011  . Anemia, macrocytic 05/10/2011  . Essential hypertension 12/07/2010  . COPD exacerbation ([place]) 12/07/2010  . Insomnia 09/07/2010  . Hypothyroidism 07/06/2010  . ANXIETY DEPRESSION 07/06/2010  . Fibromyalgia 07/06/2010  . Osteopenia 07/06/2010   Past Medical History:  Diagnosis Date  . Anemia   . Anxiety   . Arrhythmia     AVNRT  . Asthma   . COPD (chronic obstructive pulmonary disease) ([place])   . Depression   . Fibromyalgia   . Hyperlipidemia   . Hypertension   . Hypothyroidism   . Migraine     Family History  Problem Relation Age of Onset  . Colon cancer Sister 40  . Diabetes Mother   . Stroke Mother   . Dementia Mother   . Skin cancer Father   . Breast cancer Paternal Aunt   . Stomach cancer Paternal Aunt   . Stroke Sister     Past Surgical History:  Procedure Laterality Date  . ABDOMINAL HYSTERECTOMY    . APPENDECTOMY    . CARDIAC ELECTROPHYSIOLOGY MAPPING AND ABLATION  08/28/2010   EPS/RFA of AVNRT  - Dr. Crissie Sickles  . CORONARY STENT PLACEMENT  09/2012  . IR GENERIC HISTORICAL  07/29/2016   IR RADIOLOGY PERIPHERAL GUIDED IV START 07/29/2016 Corrie Mckusick, DO [facility]-INTERV RAD  . IR GENERIC HISTORICAL  07/29/2016   IR US GUIDE VASC ACCESS RIGHT 07/29/2016 Corrie Mckusick, DO [facility]-INTER[facility]  . NASAL SINUS SURGERY  1993  .  PERCUTANEOUS STENT INTERVENTION Left 10/18/2012   Procedure: PERCUTANEOUS STENT INTERVENTION;  Surgeon: Sherren Mocha, MD;  Location: [facility] CATH LAB;  Service: Cardiovascular;  Laterality: Left;  . UNILATERAL UPPER EXTREMEITY ANGIOGRAM Left 10/18/2012   Procedure: UNILATERAL UPPER Anselmo Rod;  Surgeon: Sherren Mocha, MD;  Location: [facility] CATH LAB;  Service: Cardiovascular;  Laterality: Left;   Social History   Occupational History  . DISABLED Unemployed   Social History Main Topics  . Smoking status:  Former Smoker    Packs/day: 0.50    Years: 40.00    Types: Cigarettes    Quit date: 11/29/2009  . Smokeless tobacco: Never Used     Comment: September 2011, again Oct 2013  . Alcohol use No  . Drug use: No  . Sexual activity: Not on file

## 2016-09-30 DIAGNOSIS — E039 Hypothyroidism, unspecified: Secondary | ICD-10-CM | POA: Diagnosis not present

## 2016-10-13 ENCOUNTER — Other Ambulatory Visit (INDEPENDENT_AMBULATORY_CARE_PROVIDER_SITE_OTHER): Payer: Self-pay | Admitting: Physician Assistant

## 2016-10-26 ENCOUNTER — Ambulatory Visit: Admit: 2016-10-26 | Payer: Medicare Other | Admitting: Orthopaedic Surgery

## 2016-10-26 SURGERY — ARTHROPLASTY, KNEE, UNICOMPARTMENTAL
Anesthesia: Choice | Site: Knee | Laterality: Right

## 2016-11-09 ENCOUNTER — Inpatient Hospital Stay (INDEPENDENT_AMBULATORY_CARE_PROVIDER_SITE_OTHER): Payer: Medicare Other | Admitting: Orthopaedic Surgery

## 2016-11-15 DIAGNOSIS — G894 Chronic pain syndrome: Secondary | ICD-10-CM | POA: Diagnosis not present

## 2016-11-15 DIAGNOSIS — J4540 Moderate persistent asthma, uncomplicated: Secondary | ICD-10-CM | POA: Diagnosis not present

## 2016-11-15 DIAGNOSIS — M1711 Unilateral primary osteoarthritis, right knee: Secondary | ICD-10-CM | POA: Diagnosis not present

## 2016-11-27 DIAGNOSIS — H109 Unspecified conjunctivitis: Secondary | ICD-10-CM | POA: Diagnosis not present

## 2016-11-27 DIAGNOSIS — R220 Localized swelling, mass and lump, head: Secondary | ICD-10-CM | POA: Diagnosis not present

## 2017-02-03 IMAGING — CT CT ANGIO CHEST
2 of 9 series · 18 of 46 positions shown · IV contrast (OMNI 350)
Comparison: CT chest 02/22/2011.

CLINICAL DATA: Left-sided neck and chest pain and swelling.
Evaluate for possible left subclavian artery aneurysm. Prior left
subclavian artery stenting in 3732 for a stenosis identified at that
time.

EXAM:
CT ANGIOGRAPHY CHEST WITHOUT AND WITH CONTRAST
TECHNIQUE: Multidetector CT imaging of the chest was performed using the
standard protocol before and during bolus administration of
intravenous contrast. Multiplanar CT image reconstructions and MIPs
were obtained to evaluate the vascular anatomy.
CONTRAST:  80 mL Isovue 370 IV.

[Series 9: dissection thins · axial · 0.68mm/px · z∈[+1148,+1438]mm · 15 of 641 slices shown]
[im 30/641  lung]
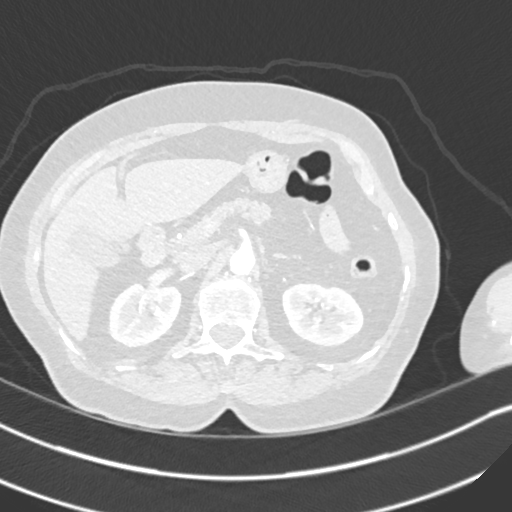
[im 88/641  soft-tissue]
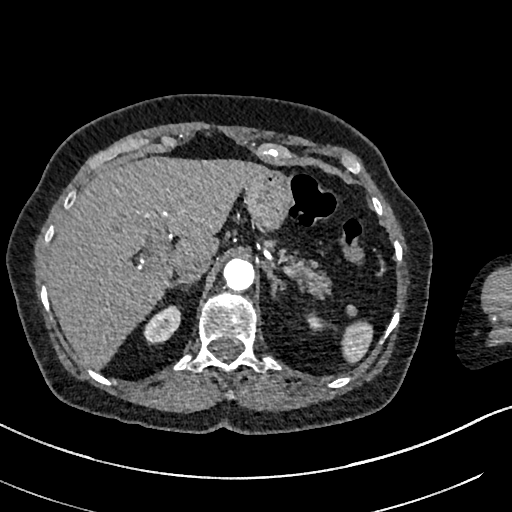
[im 117/641  lung]
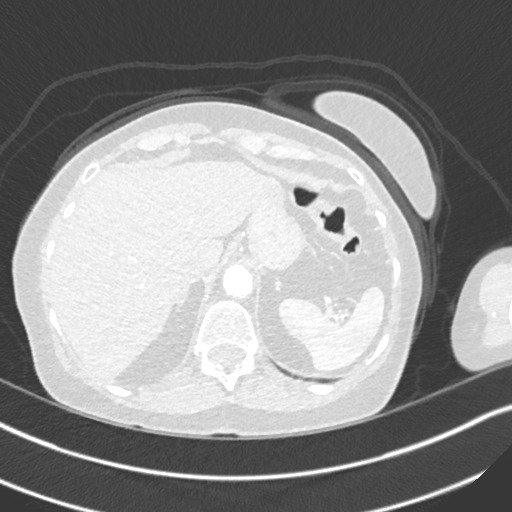
[im 146/641  soft-tissue]
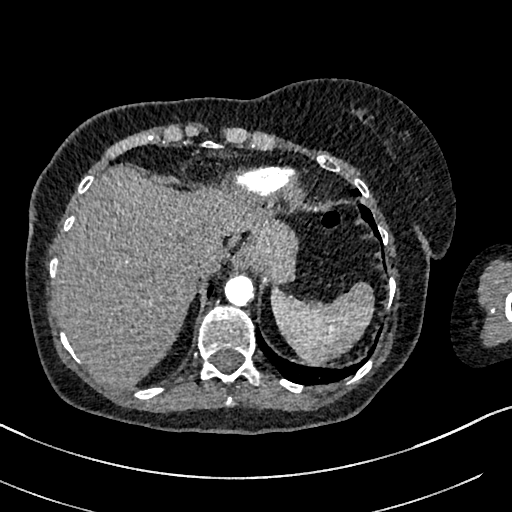
[im 204/641  lung]
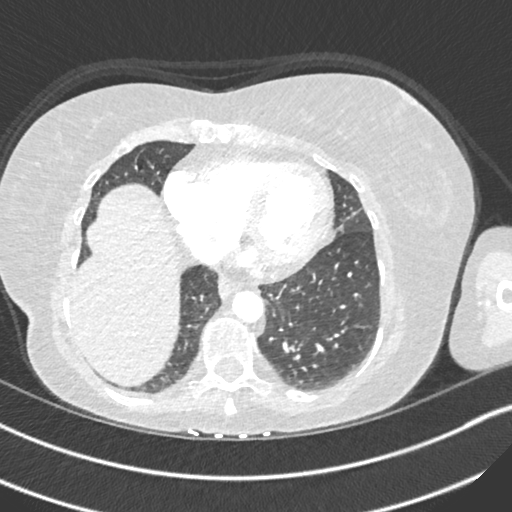
[im 233/641  soft-tissue]
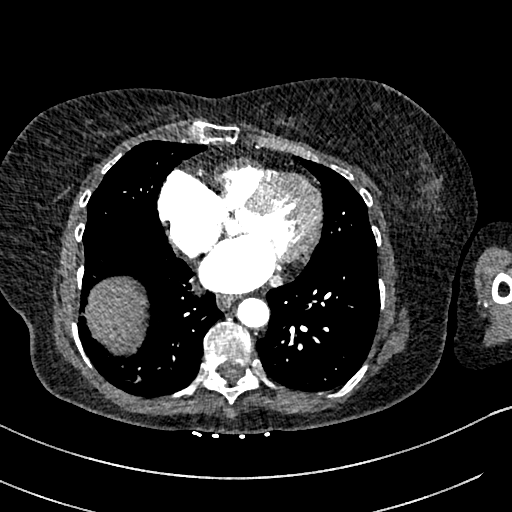
[im 291/641  lung]
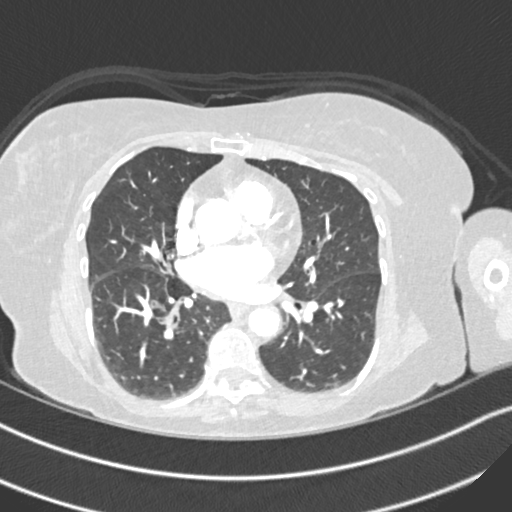
[im 321/641  soft-tissue]
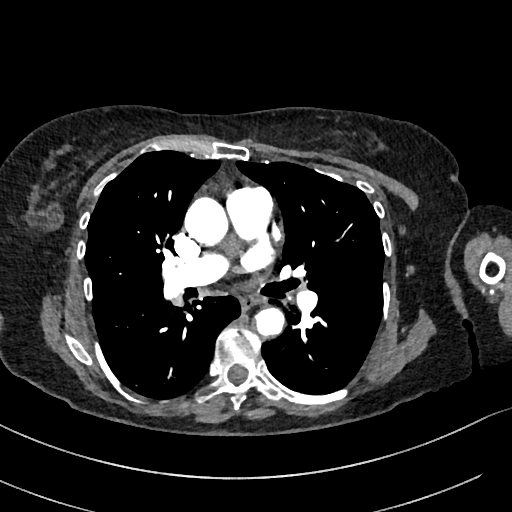
[im 350/641  lung]
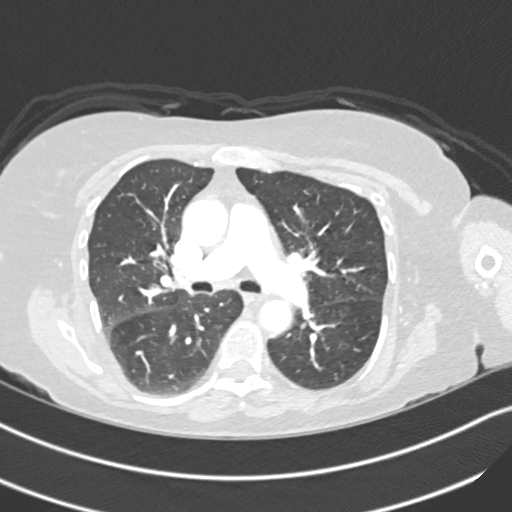
[im 408/641  soft-tissue]
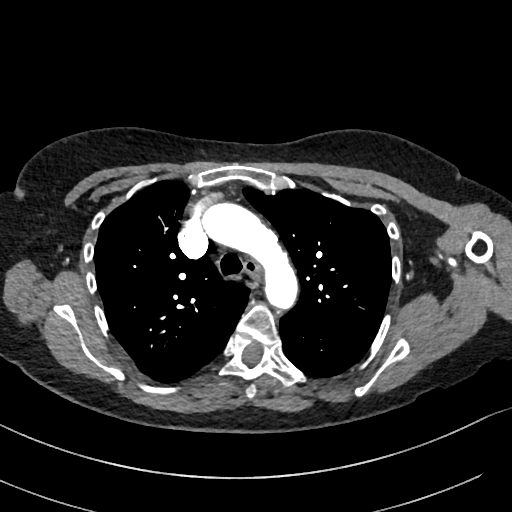
[im 437/641  lung]
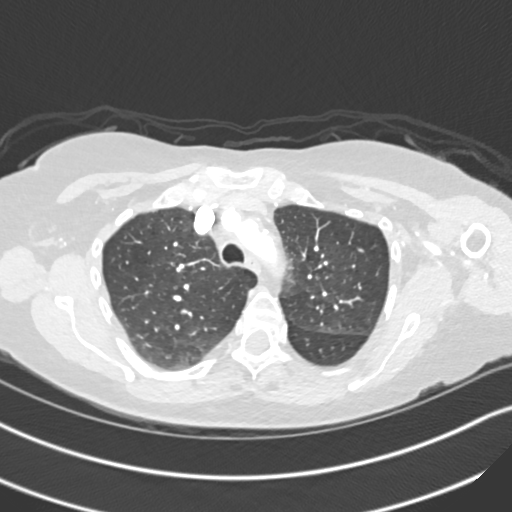
[im 495/641  soft-tissue]
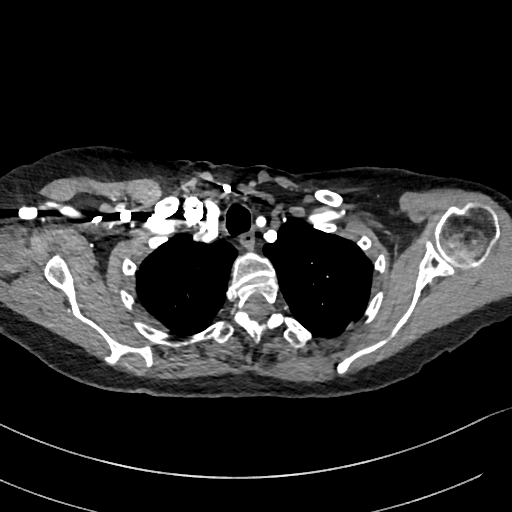
[im 524/641  lung]
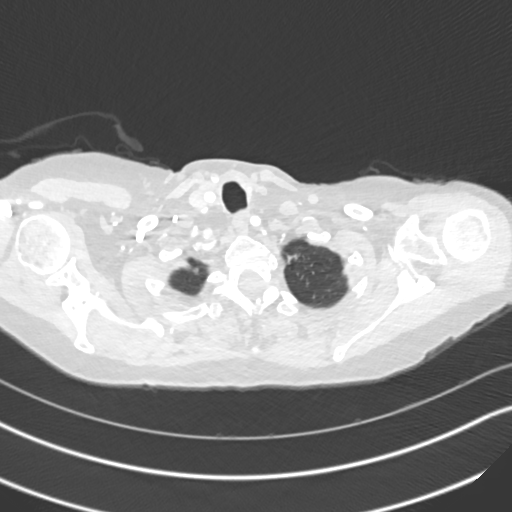
[im 553/641  soft-tissue]
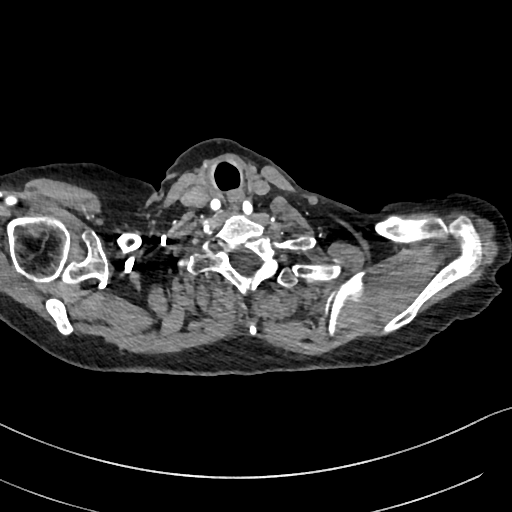
[im 611/641  lung]
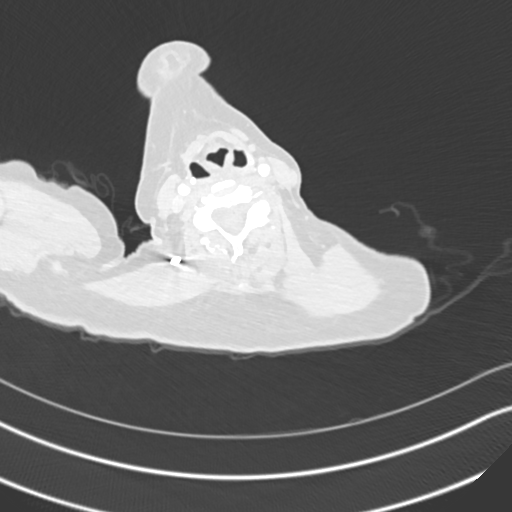

[Series 10: dissection 2mm cor · coronal · 0.58mm/px · 3 of 102 slices shown]
[im 26/102  soft-tissue]
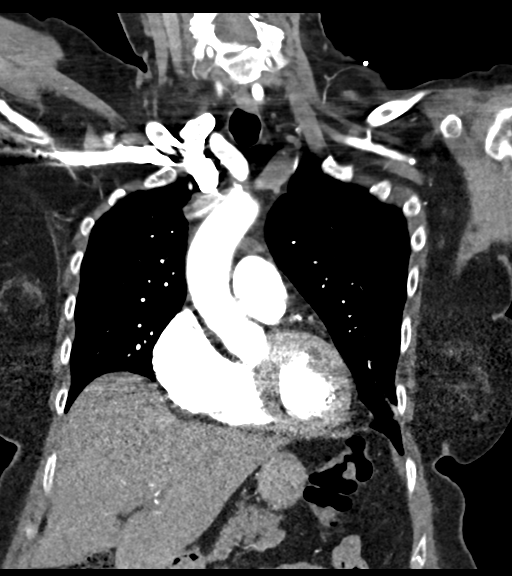
[im 51/102  soft-tissue]
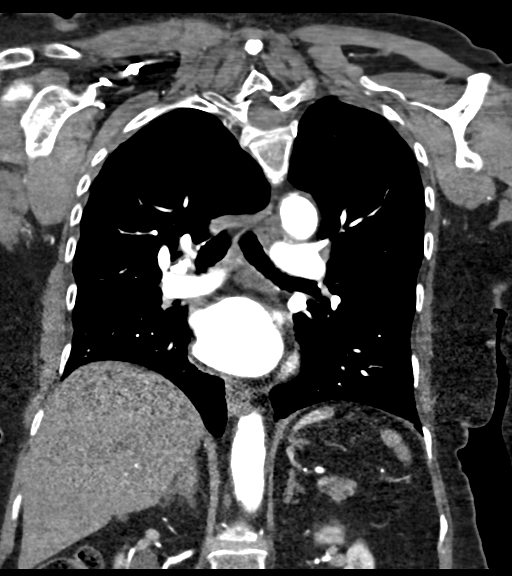
[im 76/102  soft-tissue]
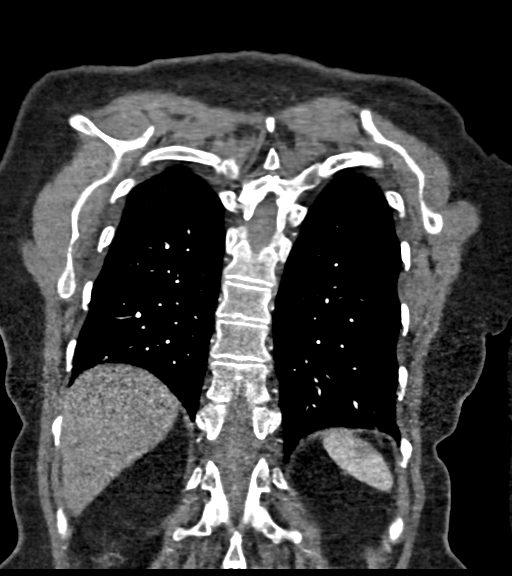

[18 of 46 positions shown; findings below may reference images not displayed]

FINDINGS: Cardiovascular: Prior left subclavian artery stenting with mild
neointimal hyperplasia but no visible hemodynamically significant
stenosis. Atherosclerosis involving the origins of the left common
carotid and left subclavian arteries, again without visible
stenosis. No evidence of left subclavian artery aneurysm.

Mild cardiomegaly. Mild biatrial enlargement. Mild left ventricular
hypertrophy. Mild-to-moderate LAD and left circumflex coronary
atherosclerosis. Left coronary dominance. No pericardial effusion.

Moderate to severe atherosclerosis involving the thoracic and upper
abdominal aorta without evidence of aneurysm or dissection. Mild
stenosis at the origin of the celiac artery. SMA origin widely
patent. The origin of the left renal artery is not visible. Origin
of the right renal artery is widely patent.

Mediastinum: Calcified right hilar and calcified lobe right
paratracheal lymph nodes. No pathologic lymphadenopathy. Normal
appearing esophagus. Visualized thyroid gland atrophic and without
nodularity.

Lungs/pleura: Emphysematous changes throughout both lungs. Densely
calcified granuloma involving the posterior right upper lobe. No
noncalcified pulmonary nodules. No confluent airspace consolidation.
No pleural effusions. Central airways patent without significant
bronchial wall thickening.

Upper abdomen: Benign cyst involving the upper pole left kidney. No
significant abnormalities allowing for the early arterial phase of
enhancement.

Musculoskeletal: Exaggeration of the usual thoracic kyphosis.
Thoracic dextroscoliosis. No acute abnormality.

Other: No left supraclavicular lymphadenopathy. No masses in the
area of clinical concern.

Review of the MIP images confirms the above findings.
IMPRESSION: 1. No evidence of left subclavian artery aneurysm. Left subclavian
artery stent widely patent with mild neointimal hyperplasia.
2. No mass or lymphadenopathy in the supraclavicular region in the
area of clinical concern.
3. Generalized atherosclerosis. No evidence of aortic aneurysm or
dissection.
4. COPD/emphysema. Old granulomatous disease. No acute
cardiopulmonary disease.

## 2017-02-03 IMAGING — US IR US GUIDE VASC ACCESS RIGHT
1 series · 2 of 2 positions shown · non-contrast
Comparison: none

INDICATION: 69-year-old female with a history of left subclavian artery
stenosis. She presents for CT and image guided IV access.

[Series 1: ir (id) (id)/(id)/(id) · 2 of 2 slices shown]
[im 1/2]
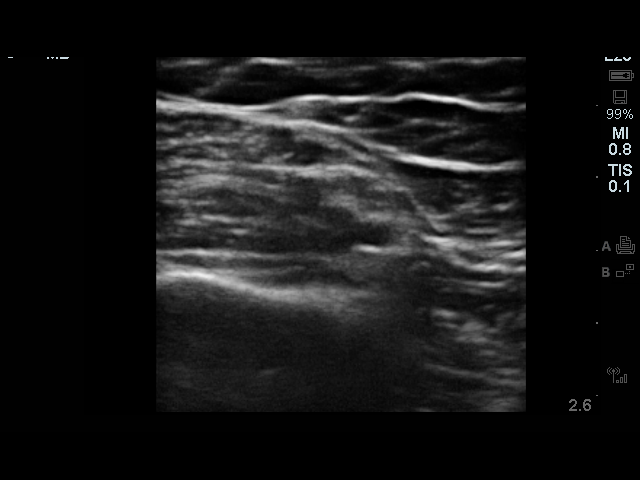
[im 2/2]
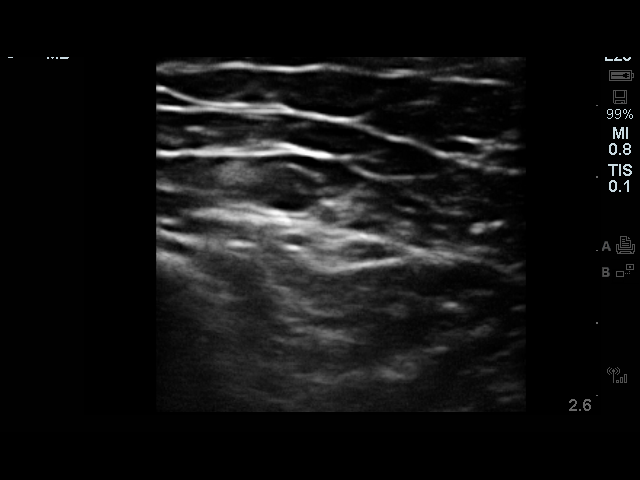

[2 of 2 positions shown; findings below may reference images not displayed]

EXAM:
IR RADIOLOGY PERIPHERAL GUIDED IV START; IR ULTRASOUND GUIDANCE VASC
ACCESS RIGHT

MEDICATIONS:
None

ANESTHESIA/SEDATION:
None

FLUOROSCOPY TIME:  None

COMPLICATIONS:
None

PROCEDURE:
The right upper arm was prepped with chlorhexidine in a sterile
fashion, and a sterile drape was applied covering the operative
field. Local anesthesia was provided with 1% lidocaine.

Under direct ultrasound guidance, the right basilic vein was
accessed with a micropuncture kit after the overlying soft tissues
were anesthetized with 1% lidocaine. An ultrasound image was saved
for documentation purposes. The micropuncture sheath easily
aspirated and flushed and was secured in place. A dressing was
placed. The patient tolerated the procedure well without immediate
post procedural complication.
IMPRESSION: Status post ultrasound-guided venipuncture, with placement of right
basilic IV access.

## 2017-02-11 DIAGNOSIS — M797 Fibromyalgia: Secondary | ICD-10-CM | POA: Diagnosis not present

## 2017-02-11 DIAGNOSIS — Z636 Dependent relative needing care at home: Secondary | ICD-10-CM | POA: Diagnosis not present

## 2017-02-11 DIAGNOSIS — G894 Chronic pain syndrome: Secondary | ICD-10-CM | POA: Diagnosis not present

## 2017-05-11 DIAGNOSIS — G894 Chronic pain syndrome: Secondary | ICD-10-CM | POA: Diagnosis not present

## 2017-05-11 DIAGNOSIS — M797 Fibromyalgia: Secondary | ICD-10-CM | POA: Diagnosis not present

## 2017-05-16 ENCOUNTER — Other Ambulatory Visit: Payer: Self-pay

## 2017-05-16 NOTE — Patient Outreach (Signed)
[facility]) Care Management  05/16/2017  Joann Ray 07-Sep-1946 [id number]   Medication Adherence to Joann Ray  Joann Ray was coming up due for one of the medication for valsartan 160 mg we call her and spoke to patient and she said she is no longer on this medication the doctor took her off and put her on a new medication she did not remember the name of the new medication.     [place] Management Direct Dial [phone]  Fax [phone] [email or web address]

## 2018-02-24 ENCOUNTER — Telehealth: Payer: Self-pay | Admitting: Cardiovascular Disease

## 2018-02-24 NOTE — Telephone Encounter (Signed)
Patient reports L shoulder pain she thinks could be related to her stent placement over 6 years ago. She would like an MRI ordered to check everything out. Reiterated to the patient that she has not been seen in the office for almost 2 years and needs to be evaluated prior to testing. She reports her armpit has been swollen since her stenting. Patient specifically denies CP, SOB, radiating pain.  Scheduled patient with Dr. Burt Knack for evaluation Monday, April 1. She will proceed to the ED if symptoms worsen prior to that time. Patient was grateful for call and agrees with treatment plan.

## 2018-02-24 NOTE — Telephone Encounter (Signed)
NEW MESSAGE    Patient calling with concerns about shoulder pain that could be related to stent. Wants to discuss getting MRI No chest pain. No shortness breath

## 2018-02-27 ENCOUNTER — Ambulatory Visit: Payer: Medicare Other | Admitting: Cardiovascular Disease

## 2018-02-27 ENCOUNTER — Encounter: Payer: Self-pay | Admitting: Cardiovascular Disease

## 2018-02-27 VITALS — BP 116/80 | HR 48 | Ht 62.0 in | Wt 121.8 lb

## 2018-02-27 DIAGNOSIS — I771 Stricture of artery: Secondary | ICD-10-CM

## 2018-02-27 DIAGNOSIS — R0602 Shortness of breath: Secondary | ICD-10-CM | POA: Diagnosis not present

## 2018-02-27 DIAGNOSIS — R011 Cardiac murmur, unspecified: Secondary | ICD-10-CM

## 2018-02-27 NOTE — Patient Instructions (Signed)
Medication Instructions:  Your provider recommends that you continue on your current medications as directed. Please refer to the Current Medication list given to you today.    Labwork: None  Testing/Procedures: Your physician has requested that you have a carotid duplex. This test is an ultrasound of the carotid arteries in your neck. It looks at blood flow through these arteries that supply the brain with blood. Allow one hour for this exam. There are no restrictions or special instructions.  Your provider has requested that you have an echocardiogram. Echocardiography is a painless test that uses sound waves to create images of your heart. It provides your doctor with information about the size and shape of your heart and how well your heart's chambers and valves are working. This procedure takes approximately one hour. There are no restrictions for this procedure.  Follow-Up: Your provider wants you to follow-up in: 1 year with Dr. Burt Knack. You will receive a reminder letter in the mail two months in advance. If you don't receive a letter, please call our office to schedule the follow-up appointment.    Any Other Special Instructions Will Be Listed Below (If Applicable).     If you need a refill on your cardiac medications before your next appointment, please call your pharmacy.

## 2018-02-27 NOTE — Progress Notes (Signed)
Cardiology Office Note Date:  02/27/2018   ID:  Joann Ray, DOB [birth year], MRN [id number]  PCP:  Christain Sacramento, MD  Cardiologist:  Sherren Mocha, MD    Chief Complaint  Patient presents with  . Dizziness     History of Present Illness: Joann Ray is a 72 y.o. female who presents for follow-up of peripheral arterial disease.  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  She presents with pain and swelling in her left shoulder and supraclavicular area.  She has had this issue in the past and was evaluated with a CT angiogram that demonstrated a widely patent stent with no evidence of aneurysm or other complication.  She denies left arm claudication symptoms.  She denies any central chest pain.  She does complain of chronic dizziness.  This is been more pronounced recently.  No syncope, edema, orthopnea, PND, or heart palpitations.  She admits to shortness of breath with physical activity.   Past Medical History:  Diagnosis Date  . Anemia   . Anxiety   . Arrhythmia     AVNRT  . Asthma   . COPD (chronic obstructive pulmonary disease) ([place])   . Depression   . Fibromyalgia   . Hyperlipidemia   . Hypertension   . Hypothyroidism   . Migraine     Past Surgical History:  Procedure Laterality Date  . ABDOMINAL HYSTERECTOMY    . APPENDECTOMY    . CARDIAC ELECTROPHYSIOLOGY MAPPING AND ABLATION  08/28/2010   EPS/RFA of AVNRT  - Dr. Crissie Sickles  . CORONARY STENT PLACEMENT  09/2012  . IR GENERIC HISTORICAL  07/29/2016   IR RADIOLOGY PERIPHERAL GUIDED IV START 07/29/2016 Corrie Mckusick, DO [facility]  . IR GENERIC HISTORICAL  07/29/2016   IR US GUIDE VASC ACCESS RIGHT 07/29/2016 Corrie Mckusick, DO [facility]  . NASAL SINUS SURGERY  1993  . PERCUTANEOUS STENT INTERVENTION Left 10/18/2012   Procedure: PERCUTANEOUS STENT INTERVENTION;  Surgeon: Sherren Mocha, MD;  Location[facility] LAB;  Service:  Cardiovascular;  Laterality: Left;  . UNILATERAL UPPER EXTREMEITY ANGIOGRAM Left 10/18/2012   Procedure: UNILATERAL UPPER Anselmo Rod;  Surgeon: Sherren Mocha, MD;  Location: [facility] CATH LAB;  Service: Cardiovascular;  Laterality: Left;    Current Outpatient Medications  Medication Sig Dispense Refill  . acetaminophen (TYLENOL) 500 MG tablet Take 1 tablet (500 mg total) by mouth every 12 (twelve) hours as needed. 30 tablet 0  . albuterol (PROAIR HFA) 108 (90 BASE) MCG/ACT inhaler INHALE 2 PUFFS INTO THE LUNGS TWICE A DAY 8.5 Inhaler 5  . amLODipine (NORVASC) 2.5 MG tablet Take 1 tablet by mouth daily.    Marland Kitchen aspirin 81 MG chewable tablet Chew 81 mg by mouth daily.    Marland Kitchen atenolol (TENORMIN) 25 MG tablet Take one tablet twice a day as needed for rapid heart rate.    . Cholecalciferol (VITAMIN D) 2000 UNITS CAPS Take 1 capsule (2,000 Units total) by mouth daily. 30 capsule   . dicyclomine (BENTYL) 10 MG capsule Take one capsule 3 times a day as needed for cramps.    Marland Kitchen LORazepam (ATIVAN) 1 MG tablet Take 1 mg by mouth 3 (three) times daily as needed.    . ondansetron (ZOFRAN) 4 MG tablet TAKE 1 TABLET BY MOUTH EVERY 6 HOURS AS NEEDED FOR NAUSEA AND VOMITING 30 tablet 1  . pantoprazole (PROTONIX) 40 MG tablet Take 40 mg by mouth.    Marland Kitchen  pravastatin (PRAVACHOL) 20 MG tablet Take 1 tablet (20 mg total) by mouth daily. 90 tablet 1  . SYNTHROID 100 MCG tablet Take 100 mcg by mouth daily.    . traMADol (ULTRAM) 50 MG tablet Take 1 tablet by mouth as needed. PT TAKES 1 TO 3 TABLETS DAILY AS NEEDED    . valsartan (DIOVAN) 160 MG tablet TAKE 1 TABLET (160 MG TOTAL) BY MOUTH DAILY. 30 tablet 5   No current facility-administered medications for this visit.     Allergies:   Penicillins and Diazepam   Social History:  The patient  reports that she quit smoking about 8 years ago. Her smoking use included cigarettes. She has a 20.00 pack-year smoking history. She has never used smokeless tobacco. She  reports that she does not drink alcohol or use drugs.   Family History:  The patient's family history includes Breast cancer in her paternal aunt; Colon cancer (age of onset: 64) in her sister; Dementia in her mother; Diabetes in her mother; Skin cancer in her father; Stomach cancer in her paternal aunt; Stroke in her mother and sister.    ROS:  Please see the history of present illness.  Otherwise, review of systems is positive for exertional dyspnea, back pain, muscle pain, dizziness, excessive fatigue, leg pain, facial swelling, wheezing, anxiety, joint swelling, headaches.  All other systems are reviewed and negative.    PHYSICAL EXAM: VS:  BP 116/80   Pulse (!) 48   Ht 5\' 2"  (1.575 m)   Wt 121 lb 12.8 oz (55.2 kg)   SpO2 99%   BMI 22.28 kg/m  , BMI Body mass index is 22.28 kg/m. GEN: Well nourished, well developed, in no acute distress  HEENT: normal  Neck: no JVD, no masses. No carotid bruits Cardiac: RRR with 2/6 early peaking systolic murmur at the RUSB, no diastolic murmur. Positive left subclavian bruit.    Chest: soft tissue swelling left supraclavicular area unchanged   Respiratory:  clear to auscultation bilaterally, normal work of breathing GI: soft, nontender, nondistended, + BS MS: no deformity or atrophy  Ext: no pretibial edema,  Radial pulses 1+ BL, brachial pulses 2+ BL Skin: warm and dry, no rash Neuro:  Strength and sensation are intact Psych: euthymic mood, full affect  EKG:  EKG is ordered today. The ekg ordered today shows sinus bradycardia 48 bpm, nonspecific ST and T wave abnormality.  Recent Labs: No results found for requested labs within last 8760 hours.   Lipid Panel     Component Value Date/Time   CHOL 245 (H) 01/09/2015 0939   TRIG 100.0 01/09/2015 0939   HDL 56.50 01/09/2015 0939   CHOLHDL 4 01/09/2015 0939   VLDL 20.0 01/09/2015 0939   LDLCALC 169 (H) 01/09/2015 0939   LDLDIRECT 135.9 10/02/2013 0949      Wt Readings from Last 3  Encounters:  02/27/18 121 lb 12.8 oz (55.2 kg)  05/17/16 128 lb (58.1 kg)  07/09/15 118 lb 3.2 oz (53.6 kg)     Cardiac Studies Reviewed: CTA Chest 07-29-2016: IMPRESSION: 1. No evidence of left subclavian artery aneurysm. Left subclavian artery stent widely patent with mild neointimal hyperplasia. 2. No mass or lymphadenopathy in the supraclavicular region in the area of clinical concern. 3. Generalized atherosclerosis. No evidence of aortic aneurysm or dissection. 4. COPD/emphysema. Old granulomatous disease. No acute cardiopulmonary disease.  ASSESSMENT AND PLAN: 1.  Left subclavian artery stenosis: I reviewed the patient's CTA from 2017 which demonstrated normal findings related to  her subclavian artery stent.  Because the patient has dizziness and left shoulder symptoms, will check an echocardiogram to evaluate the functional significance of her left subclavian artery make sure she does not have retrograde vertebral flow on the left side.  The patient's peripheral pulse exam is equal bilaterally with strong brachial pulses in both arms.  2.  Shortness of breath/heart murmur: We will check an echocardiogram.  Murmur likely is a benign flow murmur.  3.  Hypertension: Continue atenolol, valsartan, and amlodipine.  4.  Hyperlipidemia: Treated with pravastatin, followed by her PCP.  Current medicines are reviewed with the patient today.  The patient does not have concerns regarding medicines.  Labs/ tests ordered today include:  No orders of the defined types were placed in this encounter.   Disposition:   FU one year  Signed, Sherren Mocha, MD  02/27/2018 3:46 PM    [facility] [address], [place], [place]  [address] Phone: [phone]; Fax: [phone]

## 2018-03-06 ENCOUNTER — Encounter (HOSPITAL_COMMUNITY): Payer: Medicare Other

## 2018-03-06 ENCOUNTER — Encounter (HOSPITAL_COMMUNITY): Payer: Self-pay

## 2018-03-09 ENCOUNTER — Encounter (HOSPITAL_COMMUNITY): Payer: Self-pay

## 2018-03-09 ENCOUNTER — Other Ambulatory Visit (HOSPITAL_COMMUNITY): Payer: Medicare Other

## 2018-03-21 ENCOUNTER — Ambulatory Visit: Payer: Medicare Other | Admitting: Physician Assistant

## 2018-03-22 ENCOUNTER — Encounter (HOSPITAL_COMMUNITY): Payer: Medicare Other

## 2018-03-23 ENCOUNTER — Ambulatory Visit (HOSPITAL_COMMUNITY)
Admission: RE | Admit: 2018-03-23 | Discharge: 2018-03-23 | Disposition: A | Discharge status: Home / Self Care | Payer: Medicare Other | Source: Ambulatory Visit | Attending: Cardiology | Admitting: Cardiology

## 2018-03-23 DIAGNOSIS — I739 Peripheral vascular disease, unspecified: Secondary | ICD-10-CM | POA: Diagnosis not present

## 2018-03-23 DIAGNOSIS — I10 Essential (primary) hypertension: Secondary | ICD-10-CM | POA: Insufficient documentation

## 2018-03-23 DIAGNOSIS — Z87891 Personal history of nicotine dependence: Secondary | ICD-10-CM | POA: Insufficient documentation

## 2018-03-23 DIAGNOSIS — I771 Stricture of artery: Secondary | ICD-10-CM | POA: Diagnosis present

## 2018-03-24 ENCOUNTER — Ambulatory Visit (HOSPITAL_COMMUNITY): Payer: Medicare Other

## 2018-03-27 ENCOUNTER — Ambulatory Visit (HOSPITAL_COMMUNITY): Payer: Medicare Other | Attending: Cardiovascular Disease

## 2018-03-27 ENCOUNTER — Other Ambulatory Visit: Payer: Self-pay

## 2018-03-27 DIAGNOSIS — D649 Anemia, unspecified: Secondary | ICD-10-CM | POA: Insufficient documentation

## 2018-03-27 DIAGNOSIS — R011 Cardiac murmur, unspecified: Secondary | ICD-10-CM | POA: Diagnosis not present

## 2018-03-27 DIAGNOSIS — I10 Essential (primary) hypertension: Secondary | ICD-10-CM | POA: Insufficient documentation

## 2018-03-27 DIAGNOSIS — J449 Chronic obstructive pulmonary disease, unspecified: Secondary | ICD-10-CM | POA: Diagnosis not present

## 2018-03-27 DIAGNOSIS — Z87891 Personal history of nicotine dependence: Secondary | ICD-10-CM | POA: Diagnosis not present

## 2018-03-27 DIAGNOSIS — R0602 Shortness of breath: Secondary | ICD-10-CM

## 2018-03-30 ENCOUNTER — Ambulatory Visit: Payer: Medicare Other | Admitting: Physician Assistant

## 2018-04-03 ENCOUNTER — Other Ambulatory Visit: Payer: Self-pay | Admitting: Family Medicine

## 2018-04-03 DIAGNOSIS — Z1231 Encounter for screening mammogram for malignant neoplasm of breast: Secondary | ICD-10-CM

## 2018-04-05 ENCOUNTER — Ambulatory Visit: Payer: Medicare Other | Admitting: Cardiology

## 2018-04-25 ENCOUNTER — Ambulatory Visit: Payer: Medicare Other

## 2018-07-28 ENCOUNTER — Ambulatory Visit: Payer: Medicare Other | Admitting: Sports Medicine

## 2018-08-08 ENCOUNTER — Ambulatory Visit: Payer: Medicare Other | Admitting: Sports Medicine

## 2018-08-08 ENCOUNTER — Encounter: Payer: Self-pay | Admitting: Sports Medicine

## 2018-08-08 ENCOUNTER — Ambulatory Visit: Payer: Self-pay

## 2018-08-08 ENCOUNTER — Ambulatory Visit (INDEPENDENT_AMBULATORY_CARE_PROVIDER_SITE_OTHER): Payer: Medicare Other

## 2018-08-08 VITALS — BP 140/82 | HR 52 | Ht 62.0 in | Wt 127.8 lb

## 2018-08-08 DIAGNOSIS — G8929 Other chronic pain: Secondary | ICD-10-CM

## 2018-08-08 DIAGNOSIS — M19011 Primary osteoarthritis, right shoulder: Secondary | ICD-10-CM

## 2018-08-08 DIAGNOSIS — M25511 Pain in right shoulder: Secondary | ICD-10-CM

## 2018-08-08 NOTE — Patient Instructions (Addendum)

## 2018-08-08 NOTE — Procedures (Signed)
PROCEDURE NOTE:  Ultrasound Guided: Injection: Right shoulder Images were obtained and interpreted by myself, Teresa Coombs, DO  Images have been saved and stored to PACS system. Images obtained on: GE S7 Ultrasound machine    ULTRASOUND FINDINGS:  Small joint effusion. Mild degenerative changes  DESCRIPTION OF PROCEDURE:  The patient's clinical condition is marked by substantial pain and/or significant functional disability. Other conservative therapy has not provided relief, is contraindicated, or not appropriate. There is a reasonable likelihood that injection will significantly improve the patient's pain and/or functional impairment.   After discussing the risks, benefits and expected outcomes of the injection and all questions were reviewed and answered, the patient wished to undergo the above named procedure.  Verbal consent was obtained.  The ultrasound was used to identify the target structure and adjacent neurovascular structures. The skin was then prepped in sterile fashion and the target structure was injected under direct visualization using sterile technique as below:  Single injection performed as below: PREP: Alcohol and Ethel Chloride APPROACH:posterior, single injection, 21g 2 in. INJECTATE: 2 cc 0.5% Marcaine and 2 cc 40mg /mL DepoMedrol ASPIRATE: None DRESSING: Band-Aid  Post procedural instructions including recommending icing and warning signs for infection were reviewed.    This procedure was well tolerated and there were no complications.   IMPRESSION: Succesful Ultrasound Guided: Injection

## 2018-08-08 NOTE — Progress Notes (Signed)

## 2018-08-08 NOTE — Progress Notes (Signed)
Joann Ray. Joann Ray, Joann Ray at [facility]  Joann Ray - 72 y.o. female MRN [id number]  Date of birth: 02-Jun-1946  Visit Date: 08/08/2018  PCP: Christain Sacramento, MD   Referred by: Christain Sacramento, MD  Scribe(s) for today's visit: Wendy Poet, LAT, ATC  SUBJECTIVE:  Joann Marcus "Fraser Din" is here for New Patient (Initial Visit) (Shoulder pain)  Her R shoulder pain symptoms INITIALLY: Began about one month ago w/ no known MOI.  She went to her PCP and was given a medrol dose pack which did not help her symptoms.  Pt is RHD. Described as severe aching pain, radiating to R upper arm Worsened with R shoulder ROM, particularly R shoulder flexion and aBd Improved with rest Additional associated symptoms include: R shoulder mechanical symptoms noted; some N/T noted in R UE but mainly positional in nature.    At this time symptoms are worsening compared to onset. She has tried a medrol dose pack and Aspercream w/ no improvement noted.  REVIEW OF SYSTEMS: Reports night time disturbances. Denies fevers, chills, or night sweats. Denies unexplained weight loss. Denies personal history of cancer. Denies changes in bowel or bladder habits. Denies recent unreported falls. Reports new or worsening dyspnea or wheezing.  Due to asthma. Reports headaches or dizziness. Yes to headaches Reports numbness, tingling or weakness  In the extremities - in the R UE Denies dizziness or presyncopal episodes Denies lower extremity edema     HISTORY:  Prior history reviewed and updated per electronic medical record.  Social History   Occupational History  . Occupation: DISABLED    Employer: UNEMPLOYED  Tobacco Use  . Smoking status: Former Smoker    Packs/day: 0.50    Years: 40.00    Pack years: 20.00    Types: Cigarettes    Last attempt to quit: 11/29/2009    Years since quitting: 8.7  . Smokeless tobacco: Never Used  .  Tobacco comment: September 2011, again Oct 2013  Substance and Sexual Activity  . Alcohol use: No  . Drug use: No  . Sexual activity: Not on file   Social History   Social History Narrative   She has 74 year old granddaughter-Joann Ray   Diagnosed with juvenile arthritis   Pt lives at home with spouse.    Caffeine Use:2-3 cups daily.    Past Medical History:  Diagnosis Date  . Anemia   . Anxiety   . Arrhythmia     AVNRT  . Asthma   . COPD (chronic obstructive pulmonary disease) ([place])   . Depression   . Fibromyalgia   . Hyperlipidemia   . Hypertension   . Hypothyroidism   . Migraine    Past Surgical History:  Procedure Laterality Date  . ABDOMINAL HYSTERECTOMY    . APPENDECTOMY    . CARDIAC ELECTROPHYSIOLOGY MAPPING AND ABLATION  08/28/2010   EPS/RFA of AVNRT  - Dr. Crissie Sickles  . CORONARY STENT PLACEMENT  09/2012  . IR GENERIC HISTORICAL  07/29/2016   IR RADIOLOGY PERIPHERAL GUIDED IV START 07/29/2016 Corrie Mckusick, DO [facility]  . IR GENERIC HISTORICAL  07/29/2016   IR US GUIDE VASC ACCESS RIGHT 07/29/2016 Corrie Mckusick, DO [facility]  . NASAL SINUS SURGERY  1993  . PERCUTANEOUS STENT INTERVENTION Left 10/18/2012   Procedure: PERCUTANEOUS STENT INTERVENTION;  Surgeon: Sherren Mocha, MD;  Location: [facility] CATH LAB;  Service: Cardiovascular;  Laterality: Left;  . UNILATERAL UPPER EXTREMEITY ANGIOGRAM Left  10/18/2012   Procedure: UNILATERAL UPPER Anselmo Rod;  Surgeon: Sherren Mocha, MD;  Location: [facility] CATH LAB;  Service: Cardiovascular;  Laterality: Left;   family history includes Breast cancer in her paternal aunt; Colon cancer (age of onset: 46) in her sister; Dementia in her mother; Diabetes in her mother; Skin cancer in her father; Stomach cancer in her paternal aunt; Stroke in her mother and sister.  DATA OBTAINED & REVIEWED:  No results for input(s): HGBA1C, LABURIC, CREATINE in the last 8760 hours. Pertinent prior testing reviewed: C-spine XR - 03/07/2013  -age expected degenerative changes X-Rays obtained in office on 08/08/18 of the right shoulder show mild degenerative changes, calcified granulomas appreciated   OBJECTIVE:  VS:  HT:5\' 2"  (157.5 cm)   WT:127 lb 12.8 oz (58 kg)  BMI:23.37    BP:140/82  HR:(!) 52bpm  TEMP: ( )  RESP:98 %   PHYSICAL EXAM: CONSTITUTIONAL: Well-developed, Well-nourished and In no acute distress Psychiatric: Alert & appropriately interactive. and Not depressed or anxious appearing. RESPIRATORY: No increased work of breathing and Trachea Midline EYES: Pupils are equal., EOM intact without nystagmus. and No scleral icterus.  Upper extremities: Warm and well perfused NEURO: unremarkable Normal associated myotomal distribution strength to manual muscle testing Normal sensation to light touch Normal and symmetric associated DTRs   MSK Exam: Right Shoulder Exam: Well aligned, no significant deformity. No overlying skin changes. Neck: normal range of motion and supple Non tender to palpation over: Bony Landmarks Axial loading produces: Moderate pain and Mild crepitation Drop arm test: negative  Internal Rotation:  ROM: Right gluteal with severe pain.   Strength: 4/5 External Rotation:  ROM: 30 degrees at 90 degrees arm flexion and with moderate pain.   Strength: 4/5 Shoulder arc at 30 degrees of abduction is approximately 45 degrees  Hawkins: positive, moderate pain Neers: positive, severe pain  Empty Can: positive, severe pain Strength: 4/5 Speed's: positive, moderate pain Strength: Normal O'Briens: positive, moderate pain Strength: Normal    ASSESSMENT   1. Chronic right shoulder pain   2. Primary osteoarthritis of right shoulder     PLAN & PROCEDURES:   . Pertinent additional documentation may be included in corresponding procedure notes, imaging studies, problem based documentation and patient instructions. . US Guided Injection per procedure note . Discussed the foundation of  treatment for this condition is physical therapy and/or daily (5-6 days/week) therapeutic exercises, focusing on core strengthening, coordination, neuromuscular control/reeducation.  Therapeutic exercises prescribed per procedure note. . If any lack of improvement consider further diagnostic evaluation with Further advanced imaging with MRI.  We will plan to follow-up with repeat large-volume injection for presumed adhesive capsulitis in 6 weeks.  Follow-up: Return in about 6 weeks (around 09/19/2018).      CMA/ATC served as Education administrator during this visit. History, Physical, and Plan performed by medical provider.  Documentation and orders reviewed and attested to.      Gerda Diss, [facility] Physician

## 2018-08-10 ENCOUNTER — Encounter: Payer: Self-pay | Admitting: Sports Medicine

## 2018-09-07 ENCOUNTER — Ambulatory Visit: Payer: Medicare Other | Admitting: Family Medicine

## 2018-09-08 ENCOUNTER — Other Ambulatory Visit: Payer: Self-pay

## 2018-09-08 ENCOUNTER — Encounter: Payer: Self-pay | Admitting: Family Medicine

## 2018-09-08 ENCOUNTER — Ambulatory Visit: Payer: Medicare Other | Admitting: Family Medicine

## 2018-09-08 VITALS — BP 102/70 | HR 53 | Temp 97.9°F | Ht 62.0 in | Wt 130.8 lb

## 2018-09-08 DIAGNOSIS — I10 Essential (primary) hypertension: Secondary | ICD-10-CM

## 2018-09-08 DIAGNOSIS — F411 Generalized anxiety disorder: Secondary | ICD-10-CM

## 2018-09-08 DIAGNOSIS — M7501 Adhesive capsulitis of right shoulder: Secondary | ICD-10-CM | POA: Diagnosis not present

## 2018-09-08 DIAGNOSIS — M797 Fibromyalgia: Secondary | ICD-10-CM | POA: Diagnosis not present

## 2018-09-08 DIAGNOSIS — G894 Chronic pain syndrome: Secondary | ICD-10-CM

## 2018-09-08 DIAGNOSIS — E039 Hypothyroidism, unspecified: Secondary | ICD-10-CM

## 2018-09-08 DIAGNOSIS — M810 Age-related osteoporosis without current pathological fracture: Secondary | ICD-10-CM

## 2018-09-08 DIAGNOSIS — I471 Supraventricular tachycardia, unspecified: Secondary | ICD-10-CM

## 2018-09-08 DIAGNOSIS — Z1239 Encounter for other screening for malignant neoplasm of breast: Secondary | ICD-10-CM

## 2018-09-08 DIAGNOSIS — I739 Peripheral vascular disease, unspecified: Secondary | ICD-10-CM | POA: Diagnosis not present

## 2018-09-08 LAB — CBC WITH DIFFERENTIAL/PLATELET
BASOS ABS: 0.1 10*3/uL (ref 0.0–0.1)
Basophils Relative: 1.1 % (ref 0.0–3.0)
Eosinophils Absolute: 0.4 10*3/uL (ref 0.0–0.7)
Eosinophils Relative: 4.5 % (ref 0.0–5.0)
HCT: 38.9 % (ref 36.0–46.0)
HEMOGLOBIN: 12.6 g/dL (ref 12.0–15.0)
LYMPHS ABS: 2.4 10*3/uL (ref 0.7–4.0)
Lymphocytes Relative: 29.9 % (ref 12.0–46.0)
MCHC: 32.5 g/dL (ref 30.0–36.0)
MCV: 94.8 fl (ref 78.0–100.0)
MONO ABS: 0.6 10*3/uL (ref 0.1–1.0)
Monocytes Relative: 8.1 % (ref 3.0–12.0)
NEUTROS PCT: 56.4 % (ref 43.0–77.0)
Neutro Abs: 4.4 10*3/uL (ref 1.4–7.7)
Platelets: 318 10*3/uL (ref 150.0–400.0)
RBC: 4.11 Mil/uL (ref 3.87–5.11)
RDW: 15.1 % (ref 11.5–15.5)
WBC: 7.9 10*3/uL (ref 4.0–10.5)

## 2018-09-08 LAB — COMPREHENSIVE METABOLIC PANEL
ALK PHOS: 88 U/L (ref 39–117)
ALT: 16 U/L (ref 0–35)
AST: 16 U/L (ref 0–37)
Albumin: 4.4 g/dL (ref 3.5–5.2)
BILIRUBIN TOTAL: 0.4 mg/dL (ref 0.2–1.2)
BUN: 20 mg/dL (ref 6–23)
CO2: 29 meq/L (ref 19–32)
Calcium: 9.6 mg/dL (ref 8.4–10.5)
Chloride: 100 mEq/L (ref 96–112)
Creatinine, Ser: 0.96 mg/dL (ref 0.40–1.20)
GFR: 61.05 mL/min (ref >60.00)
GLUCOSE: 80 mg/dL (ref 70–99)
POTASSIUM: 4.2 meq/L (ref 3.5–5.1)
SODIUM: 140 meq/L (ref 135–145)
TOTAL PROTEIN: 7.2 g/dL (ref 6.0–8.3)

## 2018-09-08 LAB — TSH: TSH: 53.43 u[IU]/mL — ABNORMAL HIGH (ref 0.35–4.50)

## 2018-09-08 LAB — LIPID PANEL
CHOL/HDL RATIO: 5
Cholesterol: 339 mg/dL — ABNORMAL HIGH (ref 0–200)
HDL: 71.8 mg/dL (ref >39.00)
NONHDL: 267.51
TRIGLYCERIDES: 229 mg/dL — AB (ref 0.0–149.0)
VLDL: 45.8 mg/dL — ABNORMAL HIGH (ref 0.0–40.0)

## 2018-09-08 LAB — LDL CHOLESTEROL, DIRECT: LDL DIRECT: 230 mg/dL

## 2018-09-08 MED ORDER — LYRICA 50 MG PO CAPS: 50.0000 mg | ORAL_CAPSULE | Freq: Two times a day (BID) | ORAL | 2 refills | Status: DC

## 2018-09-08 MED ORDER — PNEUMOCOCCAL 13-VAL CONJ VACC IM SUSP: 0.5000 mL | Freq: Once | INTRAMUSCULAR | 0 refills | Status: AC

## 2018-09-08 NOTE — Patient Instructions (Signed)
Please return in 4 weeks for recheck   We will call you with information regarding your referral appointment. Mammogram and bone density.  If you do not hear from [place] within the next 2 weeks, please let me know. It can take 1-2 weeks to get appointments set up with the specialists.   We will call you about your blood test results.   I have ordered Lyrica to restart.   It was a pleasure meeting you today! Thank you for choosing [place] to meet your healthcare needs! I truly look forward to working with you. If you have any questions or concerns, please send me a message via Mychart or call the office at [phone].

## 2018-09-09 ENCOUNTER — Encounter: Payer: Self-pay | Admitting: Family Medicine

## 2018-09-09 NOTE — Progress Notes (Signed)
Subjective  CC:  Chief Complaint  Patient presents with  . Establish Care    Transfer from [facility], Dr. Redmond Pulling, last Physical 01/2018  . Eye Problem    feels like her eyes get pink and blurry and she is off balance     HPI: Joann Ray is a 72 y.o. female who presents to [place] at [facility] today to establish care with me as a new patient.   She has the following concerns or needs:  I have reviewed prior records from prior pcps via care everywhere.   Very pleasant [age]-year-old female with multiple medical problems as listed below no problem list.  We discussed her past medical history in detail.  She has a long history of fibromyalgia and chronic pain syndrome which has been treated for multiple years with chronic narcotics and benzodiazepines for anxiety and mood disorder.  She stopped medicines abruptly about 3 months ago.  Says she is feeling better.  She has taken Lyrica in the past with good results.  She has problems sleeping.  She would like to avoid benzodiazepines and opioids if possible.  She is not currently seeing a pain doctor.  She has history of osteoporosis and is overdue for testing.  This is not being treated  She has hypertension, hyperlipidemia, hypothyroidism.  She feels these are all well controlled.  She has a history of an arrhythmia for which she uses atenolol as needed for tachycardic symptoms or palpitations.  She is status post ablation.  She sees Dr. Burt Knack for cardiology.  Currently with cataract and problems with vision.  She has seen an eye doctor who is planning to correct this for her.  She is not currently taking her thyroid medication or cholesterol medication.  She reports her thyroid medication makes her feel badly.  Health maintenance: She is overdue for bone density screening and mammogram.  Currently seeing Dr. Paulla Fore for adhesive capsulitis of the right shoulder.  Assessment  1. GAD  (generalized anxiety disorder)   2. Adhesive capsulitis of right shoulder   3. PVD (peripheral vascular disease) ([place])   4. Fibromyalgia   5. Age-related osteoporosis without current pathological fracture   6. Chronic pain syndrome   7. Essential hypertension   8. Paroxysmal SVT (supraventricular tachycardia) (H[facility])   9. Breast cancer screening   10. Acquired hypothyroidism      Plan   Multiple chronic medical problems many of which are not well controlled.  Had long discussion with patient.  We will try to get her pain better controlled.  Restart Lyrica.  Close follow-up, recheck in 3 to 4 weeks.  Consider adding tricyclic antidepressant at that time.  Check blood work including renal function and lipids.  Monitor blood pressure.  Will need to discuss how to best treat her hypothyroidism.  Ordered mammogram and bone density testing today.  Prescription given for Prevnar.  Cardiovascular disease is reportedly well controlled.  Follow up:  No follow-ups on file. Orders Placed This Encounter  Procedures  . DG Bone Density  . MM DIGITAL SCREENING BILATERAL  . Comprehensive metabolic panel  . TSH  . Lipid panel  . CBC with Differential/Platelet  . LDL cholesterol, direct   Meds ordered this encounter  Medications  . LYRICA 50 MG capsule    Sig: Take 1 capsule (50 mg total) by mouth 2 (two) times daily.    Dispense:  90 capsule    Refill:  2  . pneumococcal  13-valent conjugate vaccine (PREVNAR 13) SUSP injection    Sig: Inject 0.5 mLs into the muscle once for 1 dose.    Dispense:  0.5 mL    Refill:  0     Depression screen [facility] 2/9 09/08/2018 01/15/2015 01/02/2014  Decreased Interest 1 0 3  Down, Depressed, Hopeless 1 0 -  PHQ - 2 Score 2 0 3  Altered sleeping 1 - -  Tired, decreased energy 1 - -  Change in appetite 1 - -  Feeling bad or failure about yourself  0 - -  Trouble concentrating 2 - -  Moving slowly or fidgety/restless 1 - -  Suicidal thoughts 0 - -  PHQ-9  Score 8 - -  Difficult doing work/chores Very difficult - -    We updated and reviewed the patient's past history in detail and it is documented below.  Patient Active Problem List   Diagnosis Date Noted  . Adhesive capsulitis of right shoulder 09/08/2018  . Paroxysmal SVT (supraventricular tachycardia) ([place]) 09/08/2018    S/p ablation; uses atenolol prn. Cards: Dr. [place] Knack   . Osteoarthritis of right knee 05/27/2016  . COPD (chronic obstructive pulmonary disease) ([place]) 03/02/2016  . Essential familial hypercholesterolemia 03/02/2016  . IBS (irritable bowel syndrome) 03/02/2016  . Migraine headache 03/02/2016  . Chronic fatigue syndrome 03/02/2016  . Vitamin D deficiency 01/15/2015    Overview:  Last Assessment & Plan:  Vitamin D level very low.  Treat with vitamin D 50,000 units once weekly for 12 weeks.     . Chronic back pain 02/13/2014    Overview:  Last Assessment & Plan:  She is experiencing exacerbation due to increased physical activity.  Use oxycodone 5 mg bid as needed for breakthrough pain   . PVD (peripheral vascular disease) ([place]) 08/16/2012  . Menopause syndrome 02/28/2012    Overview:  Last Assessment & Plan:  Patient experiencing postmenopausal symptoms. We discussed pros and cons of hormone replacement therapy. She had hysterectomy in her 57s. She did not have oophorectomy. Trial of low-dose estradiol transdermal patch.  She wants to proceed despite potential risks.   . Chronic pain 07/27/2011    Overview:  Last Assessment & Plan:  Stable. Pain clinic appt pending. Will continue to fill ms contin until appt.   . Essential hypertension 12/07/2010    .Qualifier: Diagnosis of  By: Wynona Luna   Overview:  Overview:  .Qualifier: Diagnosis of  By: Wynona Luna  Last Assessment & Plan:  Stable.  No change in medication.   . Insomnia 09/07/2010    Qualifier: Diagnosis of  By: Wynona Luna   Overview:  Overview:  Qualifier: Diagnosis  of  By: Wynona Luna  Last Assessment & Plan:  Patient experiencing daytime fatigue. Patient advised to try to taper clonazepam dose to 0.5 mg at bedtime as needed.   . Hypothyroidism 07/06/2010    Qualifier: Diagnosis of  By: Wynona Luna   Overview:  Overview:  Qualifier: Diagnosis of  By: Wynona Luna  Last Assessment & Plan:  Patient's TSH is elevated. She has been taking her Synthroid with antacids. Patient advised to take thyroid medication on an empty stomach. Repeat TSH in 2 months.   Marland Kitchen GAD (generalized anxiety disorder) 07/06/2010    Qualifier: Diagnosis of  By: Wynona Luna    . Fibromyalgia 07/06/2010    Qualifier: Diagnosis of  By: Wynona Luna   Overview:  Overview:  Qualifier: Diagnosis of  By: Wynona Luna  Last Assessment & Plan:  Continue same dose of long acting morphine.   . Osteoporosis 07/06/2010   Health Maintenance  Topic Date Due  . Hepatitis C Screening  10/08/1946  . PNA vac Low Risk Adult (2 of 2 - PCV13) 09/26/2015  . DEXA SCAN  12/26/2015  . MAMMOGRAM  08/04/2017  . TETANUS/TDAP  11/13/2018  . COLONOSCOPY  11/29/2020  . INFLUENZA VACCINE  Discontinued   Immunization History  Administered Date(s) Administered  . Influenza Split 09/28/2011  . Influenza Whole 09/07/2010  . Influenza,inj,Quad PF,6+ Mos 09/04/2014  . Influenza-Unspecified 09/28/2011, 09/04/2014  . Pneumococcal Polysaccharide-23 09/25/2014  . Pneumococcal-Unspecified 09/25/2014  . Td 07/19/2005  . Tdap 11/13/2008   Current Meds  Medication Sig  . albuterol (PROAIR HFA) 108 (90 BASE) MCG/ACT inhaler INHALE 2 PUFFS INTO THE LUNGS TWICE A DAY  . amLODipine (NORVASC) 2.5 MG tablet Take 1 tablet by mouth daily.  Marland Kitchen atenolol (TENORMIN) 25 MG tablet Take one tablet twice a day as needed for rapid heart rate.  . valsartan (DIOVAN) 160 MG tablet TAKE 1 TABLET (160 MG TOTAL) BY MOUTH DAILY.  . [DISCONTINUED] SYNTHROID 100 MCG tablet Take 100  mcg by mouth daily.    Allergies: Patient is allergic to penicillins and diazepam. Past Medical History Patient  has a past medical history of Anemia, Anxiety, Arrhythmia, Asthma, COPD (chronic obstructive pulmonary disease) ([place]), Depression, Fibromyalgia, Hyperlipidemia, Hypertension, Hypothyroidism, and Migraine. Past Surgical History Patient  has a past surgical history that includes Appendectomy; Abdominal hysterectomy; Nasal sinus surgery (1993); Cardiac electrophysiology mapping and ablation (08/28/2010); Coronary stent placement (09/2012); unilateral upper extremeity angiogram (Left, 10/18/2012); percutaneous stent intervention (Left, 10/18/2012); ir generic historical (07/29/2016); and ir generic historical (07/29/2016). Family History: Patient family history includes Breast cancer in her paternal aunt; Colon cancer (age of onset: 73) in her sister; Dementia in her mother; Diabetes in her mother; Skin cancer in her father; Stomach cancer in her paternal aunt; Stroke in her mother and sister. Social History:  Patient  reports that she quit smoking about 8 years ago. Her smoking use included cigarettes. She has a 20.00 pack-year smoking history. She has never used smokeless tobacco. She reports that she does not drink alcohol or use drugs.  Review of Systems: Constitutional: negative for fever or malaise Ophthalmic: negative for photophobia, double vision or loss of vision, positive for blurred vision Cardiovascular: negative for chest pain, dyspnea on exertion, or new LE swelling, positive for intermittent palpitations but rare Respiratory: negative for SOB or persistent cough Gastrointestinal: negative for abdominal pain, change in bowel habits or melena Genitourinary: negative for dysuria or gross hematuria Musculoskeletal: negative for new gait disturbance or muscular weakness, positive for back pain Integumentary: negative for new or persistent rashes Neurological: negative for TIA or  stroke symptoms Psychiatric: negative for SI or delusions, positive for anxiety Allergic/Immunologic: negative for hives  Patient Care Team    Relationship Specialty Notifications Start End  Leamon Arnt, MD PCP - General Family Medicine  09/08/18     Objective  Vitals: BP 102/70   Pulse (!) 53   Temp 97.9 F (36.6 C)   Ht 5\' 2"  (1.575 m)   Wt 130 lb 12.8 oz (59.3 kg)   SpO2 99%   BMI 23.92 kg/m  General:  Well developed, well nourished, no acute distress  Psych:  Alert and oriented,normal mood and affect HEENT:  Normocephalic, atraumatic, non-icteric sclera, PERRL, oropharynx  is without mass or exudate, supple neck without adenopathy, mass or thyromegaly Cardiovascular:  RRR without gallop, rub or murmur, nondisplaced PMI Respiratory:  Good breath sounds bilaterally, CTAB with normal respiratory effort Skin:  Warm, no rashes or suspicious lesions noted Neurologic:    Mental status is normal. Gross motor and sensory exams are normal. Normal gait   Commons side effects, risks, benefits, and alternatives for medications and treatment plan prescribed today were discussed, and the patient expressed understanding of the given instructions. Patient is instructed to call or message via MyChart if he/she has any questions or concerns regarding our treatment plan. No barriers to understanding were identified. We discussed Red Flag symptoms and signs in detail. Patient expressed understanding regarding what to do in case of urgent or emergency type symptoms.   Medication list was reconciled, printed and provided to the patient in AVS. Patient instructions and summary information was reviewed with the patient as documented in the AVS. This note was prepared with assistance of Dragon voice recognition software. Occasional wrong-word or sound-a-like substitutions may have occurred due to the inherent limitations of voice recognition software

## 2018-09-10 NOTE — Progress Notes (Signed)
Please call patient: I have reviewed his/her lab results. Labs show very UNCONTROLLED low thyroid - this is definitely contributing to her symptoms of not feeling well. As well, her cholesterol is very controlled again. Please schedule ov to discuss management of these issues. She should have a f/u visit scheduled in 4 weeks to discuss her pain mgt; I also recommend another visit sooner to discuss these problems.

## 2018-09-15 ENCOUNTER — Other Ambulatory Visit: Payer: Self-pay | Admitting: Family Medicine

## 2018-09-15 MED ORDER — ALBUTEROL SULFATE HFA 108 (90 BASE) MCG/ACT IN AERS
INHALATION_SPRAY | RESPIRATORY_TRACT | 5 refills | Status: DC
Start: 2018-09-15 — End: 2019-02-26

## 2018-09-15 NOTE — Telephone Encounter (Signed)
Patient is requesting a refill of: albuterol inhaler   Outside provider- patient will need new Rx  LOV: 09/08/18  PCP: [place]: verified

## 2018-09-15 NOTE — Telephone Encounter (Signed)
Copied from [place] [id number]. Topic: Quick Communication - Rx Refill/Question >> Sep 15, 2018  4:11 PM Reyne Dumas L wrote: Medication: albuterol (PROAIR HFA) 108 (90 BASE) MCG/ACT inhaler  Has the patient contacted their pharmacy? Yes - this is the first time provider will fill for pt (Agent: If no, request that the patient contact the pharmacy for the refill.) (Agent: If yes, when and what did the pharmacy advise?)  Preferred Pharmacy (with phone number or street name): [id number] - [place], [place] - [address] [place] [address] [place] [address] [phone] (Phone) [phone] (Fax)  Agent: Please be advised that RX refills may take up to 3 business days. We ask that you follow-up with your pharmacy.

## 2018-09-19 ENCOUNTER — Ambulatory Visit: Payer: Medicare Other | Admitting: Sports Medicine

## 2018-09-19 DIAGNOSIS — Z0289 Encounter for other administrative examinations: Secondary | ICD-10-CM

## 2018-09-21 ENCOUNTER — Encounter: Payer: Self-pay | Admitting: Family Medicine

## 2018-09-21 ENCOUNTER — Other Ambulatory Visit: Payer: Self-pay

## 2018-09-21 ENCOUNTER — Ambulatory Visit: Payer: Medicare Other | Admitting: Family Medicine

## 2018-09-21 VITALS — BP 112/68 | HR 55 | Temp 98.2°F | Ht 62.0 in | Wt 136.2 lb

## 2018-09-21 DIAGNOSIS — J0100 Acute maxillary sinusitis, unspecified: Secondary | ICD-10-CM | POA: Diagnosis not present

## 2018-09-21 DIAGNOSIS — E7801 Familial hypercholesterolemia: Secondary | ICD-10-CM | POA: Diagnosis not present

## 2018-09-21 DIAGNOSIS — M797 Fibromyalgia: Secondary | ICD-10-CM | POA: Diagnosis not present

## 2018-09-21 DIAGNOSIS — E039 Hypothyroidism, unspecified: Secondary | ICD-10-CM

## 2018-09-21 MED ORDER — AZITHROMYCIN 250 MG PO TABS: ORAL_TABLET | ORAL | 0 refills | Status: DC

## 2018-09-21 MED ORDER — LEVOTHYROXINE SODIUM 50 MCG PO TABS: ORAL_TABLET | ORAL | 0 refills | Status: DC

## 2018-09-21 MED ORDER — ROSUVASTATIN CALCIUM 5 MG PO TABS: 5.0000 mg | ORAL_TABLET | Freq: Every day | ORAL | 0 refills | Status: DC

## 2018-09-21 NOTE — Progress Notes (Signed)
Subjective  CC:  Chief Complaint  Patient presents with  . Thyroid Problem    Increased Thyroid Levels, here to discuss treatment  . Hyperlipidemia    Discuss treatment options  . Sinus Problem    sinus congestion and greensih mucus     HPI: Joann Ray is a 72 y.o. female who presents to the office today to address the problems listed above in the chief complaint.  Hyperlipidemia f/u: Patient presents for follow up of lipids. Very uncontrolled with LDL =230!Marland Kitchen She reports side effects with multiple meds in the past but willing to retry. Complicated by uncontrolled hypothyroidism.   Hypothyroidism f/u: Joann Ray is a 72 y.o. female who presents for follow up of hypothyroidism. TSH 50. Reports had been on meds several years ago but felt "shaky". Admits to fatigue, puffiness, low mood. No sob, + mild LE edema.   Sinus infection with sinus pain and thick drainage x 2 weeks w/o sob.   Started low dose lyrica last visit: helping.  Assessment  1. Essential familial hypercholesterolemia   2. Acquired hypothyroidism   3. Fibromyalgia   4. Acute non-recurrent maxillary sinusitis      Plan   Familial HLD with low thyroid:  Extensive counseling done. Start crestor, very low dose and titrate slowly. Start once weekly. See avs for instructions. Close f/u  Hypothyroidism: very uncontrolled and symptomatic. Educated on risks of untreated low thyroid. Start syntrhoid at 25 and titrate slowly. Perhaps side effects from last trial were due to starting at high dose.   Treat sinus infection.  lyrica is improving fibro pain. Will titrate up soon.   Follow up: Return in about 4 weeks (around 10/19/2018) for recheck.   No orders of the defined types were placed in this encounter.  Meds ordered this encounter  Medications  . rosuvastatin (CRESTOR) 5 MG tablet    Sig: Take 1 tablet (5 mg total) by mouth daily.    Dispense:  30 tablet    Refill:  0  . levothyroxine  (SYNTHROID, LEVOTHROID) 50 MCG tablet    Sig: Take 0.5 tablets (25 mcg total) by mouth daily before breakfast for 14 days, THEN 1 tablet (50 mcg total) daily before breakfast.    Dispense:  53 tablet    Refill:  0  . azithromycin (ZITHROMAX) 250 MG tablet    Sig: Take 2 tabs today, then 1 tab daily for 4 days    Dispense:  1 each    Refill:  0      I reviewed the patients updated PMH, FH, and SocHx.    Patient Active Problem List   Diagnosis Date Noted  . Adhesive capsulitis of right shoulder 09/08/2018  . Paroxysmal SVT (supraventricular tachycardia) ([place]) 09/08/2018  . Osteoarthritis of right knee 05/27/2016  . COPD (chronic obstructive pulmonary disease) ([place]) 03/02/2016  . Essential familial hypercholesterolemia 03/02/2016  . IBS (irritable bowel syndrome) 03/02/2016  . Migraine headache 03/02/2016  . Chronic fatigue syndrome 03/02/2016  . Vitamin D deficiency 01/15/2015  . Chronic back pain 02/13/2014  . PVD (peripheral vascular disease) ([place]) 08/16/2012  . Menopause syndrome 02/28/2012  . Chronic pain 07/27/2011  . Essential hypertension 12/07/2010  . Insomnia 09/07/2010  . Hypothyroidism 07/06/2010  . GAD (generalized anxiety disorder) 07/06/2010  . Fibromyalgia 07/06/2010  . Osteoporosis 07/06/2010   Current Meds  Medication Sig  . albuterol (PROAIR HFA) 108 (90 Base) MCG/ACT inhaler INHALE 2 PUFFS INTO THE LUNGS TWICE A DAY  . amLODipine (  NORVASC) 2.5 MG tablet Take 1 tablet by mouth daily.  Marland Kitchen aspirin 81 MG chewable tablet Chew 81 mg by mouth daily.  Marland Kitchen LYRICA 50 MG capsule Take 1 capsule (50 mg total) by mouth 2 (two) times daily.  . valsartan (DIOVAN) 160 MG tablet TAKE 1 TABLET (160 MG TOTAL) BY MOUTH DAILY.    Allergies: Patient is allergic to penicillins and diazepam. Family History: Patient family history includes Breast cancer in her paternal aunt; Colon cancer (age of onset: 43) in her sister; Dementia in her mother; Diabetes in her mother; Skin cancer  in her father; Stomach cancer in her paternal aunt; Stroke in her mother and sister. Social History:  Patient  reports that she quit smoking about 8 years ago. Her smoking use included cigarettes. She has a 20.00 pack-year smoking history. She has never used smokeless tobacco. She reports that she does not drink alcohol or use drugs.  Review of Systems: Constitutional: Negative for fever malaise or anorexia Cardiovascular: negative for chest pain Respiratory: negative for SOB or persistent cough Gastrointestinal: negative for abdominal pain  Objective  Vitals: BP 112/68   Pulse (!) 55   Temp 98.2 F (36.8 C)   Ht 5\' 2"  (1.575 m)   Wt 136 lb 3.2 oz (61.8 kg)   SpO2 98%   BMI 24.91 kg/m  General: no acute distress , A&Ox3, puffy face HEENT: PEERL, conjunctiva normal, Oropharynx moist,neck is supple, bilateral sinus ttp Cardiovascular:  RRR without murmur or gallop.  Respiratory:  Good breath sounds bilaterally, CTAB with normal respiratory effort Skin:  Warm, no rashes Tr pedal edema  Lab Results  Component Value Date   CHOL 339 (H) 09/08/2018   HDL 71.80 09/08/2018   LDLCALC 169 (H) 01/09/2015   LDLDIRECT 230.0 09/08/2018   TRIG 229.0 (H) 09/08/2018   CHOLHDL 5 09/08/2018   Lab Results  Component Value Date   TSH 53.43 (H) 09/08/2018       Commons side effects, risks, benefits, and alternatives for medications and treatment plan prescribed today were discussed, and the patient expressed understanding of the given instructions. Patient is instructed to call or message via MyChart if he/she has any questions or concerns regarding our treatment plan. No barriers to understanding were identified. We discussed Red Flag symptoms and signs in detail. Patient expressed understanding regarding what to do in case of urgent or emergency type symptoms.   Medication list was reconciled, printed and provided to the patient in AVS. Patient instructions and summary information was  reviewed with the patient as documented in the AVS. This note was prepared with assistance of Dragon voice recognition software. Occasional wrong-word or sound-a-like substitutions may have occurred due to the inherent limitations of voice recognition software

## 2018-09-21 NOTE — Patient Instructions (Signed)
Please return in 4 weeks.  If you have any questions or concerns, please don't hesitate to send me a message via MyChart or call the office at [phone]. Thank you for visiting with Joann Ray today! It's our pleasure caring for you.  Start the crestor 5mg  twice a week for 1-2 weeks,  Then increase to 3x/week for a week, Then increase to 4x/week for a week, etc until you are taking it nightly.  Start your thyroid medication daily.   Take the thyroid hormone every day, with water, at least 30 minutes before breakfast, separated by at least 4 hours from: - acid reflux medications - calcium - iron - multivitamins

## 2018-09-23 ENCOUNTER — Encounter: Payer: Self-pay | Admitting: Family Medicine

## 2018-09-27 ENCOUNTER — Encounter: Payer: Self-pay | Admitting: Sports Medicine

## 2018-10-02 ENCOUNTER — Ambulatory Visit: Payer: Medicare Other | Admitting: Family Medicine

## 2018-10-09 ENCOUNTER — Other Ambulatory Visit: Payer: Self-pay

## 2018-10-09 ENCOUNTER — Ambulatory Visit: Payer: Medicare Other | Admitting: Family Medicine

## 2018-10-09 ENCOUNTER — Encounter: Payer: Self-pay | Admitting: Family Medicine

## 2018-10-09 VITALS — BP 112/76 | HR 58 | Temp 98.0°F | Ht 62.0 in | Wt 133.2 lb

## 2018-10-09 DIAGNOSIS — M797 Fibromyalgia: Secondary | ICD-10-CM

## 2018-10-09 DIAGNOSIS — E7801 Familial hypercholesterolemia: Secondary | ICD-10-CM

## 2018-10-09 DIAGNOSIS — E039 Hypothyroidism, unspecified: Secondary | ICD-10-CM | POA: Diagnosis not present

## 2018-10-09 MED ORDER — DICLOFENAC SODIUM 75 MG PO TBEC: 75.0000 mg | DELAYED_RELEASE_TABLET | Freq: Two times a day (BID) | ORAL | 0 refills | Status: DC

## 2018-10-09 MED ORDER — PREGABALIN 150 MG PO CAPS: 150.0000 mg | ORAL_CAPSULE | Freq: Two times a day (BID) | ORAL | 5 refills | Status: DC

## 2018-10-09 NOTE — Patient Instructions (Signed)
Please return in 3 months for Recheck of pain.   Please schedule an annual wellness visit  Medicare recommends an Annual Wellness Visit for all patients. Please schedule this to be done with our Nurse Educator, Maudie Mercury. This is an informative "talk" visit; it's goals are to ensure that your health care needs are being met and to give you education regarding avoiding falls, ensuring you are not suffering from depression or problems with memory or thinking, and to educate you on Advance Care Planning. It helps me take good care of you!   If you have any questions or concerns, please don't hesitate to send me a message via MyChart or call the office at [phone]. Thank you for visiting with [place] today! It's our pleasure caring for you.   Please call to get your mammogram and bone density testing scheduled.   Go get your prevnar vaccine.   We will call you with information regarding your referral appointment. Endocrinology with Dr. Meyer Russel.  If you do not hear from [place] within the next 2 weeks, please let me know. It can take 1-2 weeks to get appointments set up with the specialists.   We are increasing your lyrica today and adding diclofenac twice a day.

## 2018-10-09 NOTE — Progress Notes (Signed)
Subjective  CC:  Chief Complaint  Patient presents with  . Hypothyroidism    patient states that she is in pain, she states thyroid medication makes it worse.  . Arm Pain    8/10 on pain scale, Right arm     HPI: Joann Ray is a 72 y.o. female who presents to the office today to address the problems listed above in the chief complaint.  Hypothyroidism: we started low dose thyroid last month... Pt reports causing increase in her fibromyalgia pain, even at the low dose.   Fibromyalgia: was improving with lyrica. No AEs. No longer on narcotics.   familial hyperlipidemia: hasn't started crestor due to pain.   Assessment  1. Acquired hypothyroidism   2. Essential familial hypercholesterolemia   3. Fibromyalgia      Plan   hypothyroidism:  Refer to endocrinology for help due to her report of increased pain on levothyroxine.   Titrate up lyrica to 150 bid and add nsaid for fibro pain.  Elevated lipids: deferring treatment until can get thyroid and pain under control  Follow up: Return in about 3 months (around 01/09/2019) for recheck thyroid and pain; , AWV at patient's convenience.   Orders Placed This Encounter  Procedures  . Ambulatory referral to Endocrinology   Meds ordered this encounter  Medications  . pregabalin (LYRICA) 150 MG capsule    Sig: Take 1 capsule (150 mg total) by mouth 2 (two) times daily. Increasing up from 50 bid    Dispense:  60 capsule    Refill:  5  . diclofenac (VOLTAREN) 75 MG EC tablet    Sig: Take 1 tablet (75 mg total) by mouth 2 (two) times daily.    Dispense:  30 tablet    Refill:  0      I reviewed the patients updated PMH, FH, and SocHx.    Patient Active Problem List   Diagnosis Date Noted  . Adhesive capsulitis of right shoulder 09/08/2018  . Paroxysmal SVT (supraventricular tachycardia) ([place]) 09/08/2018  . Osteoarthritis of right knee 05/27/2016  . COPD (chronic obstructive pulmonary disease) ([place]) 03/02/2016  .  Essential familial hypercholesterolemia 03/02/2016  . IBS (irritable bowel syndrome) 03/02/2016  . Migraine headache 03/02/2016  . Chronic fatigue syndrome 03/02/2016  . Vitamin D deficiency 01/15/2015  . Chronic back pain 02/13/2014  . PVD (peripheral vascular disease) ([place]) 08/16/2012  . Menopause syndrome 02/28/2012  . Chronic pain 07/27/2011  . Essential hypertension 12/07/2010  . Insomnia 09/07/2010  . Hypothyroidism 07/06/2010  . GAD (generalized anxiety disorder) 07/06/2010  . Fibromyalgia 07/06/2010  . Osteoporosis 07/06/2010   Current Meds  Medication Sig  . albuterol (PROAIR HFA) 108 (90 Base) MCG/ACT inhaler INHALE 2 PUFFS INTO THE LUNGS TWICE A DAY  . amLODipine (NORVASC) 2.5 MG tablet Take 1 tablet by mouth daily.  Marland Kitchen atenolol (TENORMIN) 25 MG tablet Take one tablet twice a day as needed for rapid heart rate.  . levothyroxine (SYNTHROID, LEVOTHROID) 50 MCG tablet Take 0.5 tablets (25 mcg total) by mouth daily before breakfast for 14 days, THEN 1 tablet (50 mcg total) daily before breakfast.  . valsartan (DIOVAN) 160 MG tablet TAKE 1 TABLET (160 MG TOTAL) BY MOUTH DAILY.  . [DISCONTINUED] LYRICA 50 MG capsule Take 1 capsule (50 mg total) by mouth 2 (two) times daily.    Allergies: Patient is allergic to penicillins and diazepam. Family History: Patient family history includes Breast cancer in her paternal aunt; Colon cancer (age of onset: 34)  in her sister; Dementia in her mother; Diabetes in her mother; Skin cancer in her father; Stomach cancer in her paternal aunt; Stroke in her mother and sister. Social History:  Patient  reports that she quit smoking about 8 years ago. Her smoking use included cigarettes. She has a 20.00 pack-year smoking history. She has never used smokeless tobacco. She reports that she does not drink alcohol or use drugs.  Review of Systems: Constitutional: Negative for fever malaise or anorexia Cardiovascular: negative for chest  pain Respiratory: negative for SOB or persistent cough Gastrointestinal: negative for abdominal pain  Objective  Vitals: BP 112/76   Pulse (!) 58   Temp 98 F (36.7 C)   Ht 5\' 2"  (1.575 m)   Wt 133 lb 3.2 oz (60.4 kg)   SpO2 96%   BMI 24.36 kg/m  General: no acute distress , A&Ox3 HEENT: puffy face, PEERL, conjunctiva normal, Oropharynx moist,neck is supple Cardiovascular:  RRR without murmur or gallop.  Respiratory:  Good breath sounds bilaterally, CTAB with normal respiratory effort Skin:  Warm, no rashes   Lab Results  Component Value Date   TSH 53.43 (H) 09/08/2018     Commons side effects, risks, benefits, and alternatives for medications and treatment plan prescribed today were discussed, and the patient expressed understanding of the given instructions. Patient is instructed to call or message via MyChart if he/she has any questions or concerns regarding our treatment plan. No barriers to understanding were identified. We discussed Red Flag symptoms and signs in detail. Patient expressed understanding regarding what to do in case of urgent or emergency type symptoms.   Medication list was reconciled, printed and provided to the patient in AVS. Patient instructions and summary information was reviewed with the patient as documented in the AVS. This note was prepared with assistance of Dragon voice recognition software. Occasional wrong-word or sound-a-like substitutions may have occurred due to the inherent limitations of voice recognition software

## 2018-10-16 ENCOUNTER — Ambulatory Visit: Payer: Medicare Other | Admitting: Family Medicine

## 2018-10-18 ENCOUNTER — Other Ambulatory Visit: Payer: Self-pay | Admitting: Family Medicine

## 2018-11-11 ENCOUNTER — Other Ambulatory Visit: Payer: Self-pay | Admitting: Family Medicine

## 2018-12-07 ENCOUNTER — Encounter: Payer: Self-pay | Admitting: Family Medicine

## 2018-12-07 ENCOUNTER — Ambulatory Visit (INDEPENDENT_AMBULATORY_CARE_PROVIDER_SITE_OTHER): Payer: Medicare Other | Admitting: Family Medicine

## 2018-12-07 VITALS — BP 124/70 | HR 47 | Temp 98.2°F | Resp 16 | Ht 62.0 in | Wt 130.0 lb

## 2018-12-07 DIAGNOSIS — F411 Generalized anxiety disorder: Secondary | ICD-10-CM | POA: Diagnosis not present

## 2018-12-07 DIAGNOSIS — M797 Fibromyalgia: Secondary | ICD-10-CM | POA: Diagnosis not present

## 2018-12-07 DIAGNOSIS — I471 Supraventricular tachycardia: Secondary | ICD-10-CM

## 2018-12-07 DIAGNOSIS — F5105 Insomnia due to other mental disorder: Secondary | ICD-10-CM

## 2018-12-07 DIAGNOSIS — I10 Essential (primary) hypertension: Secondary | ICD-10-CM

## 2018-12-07 DIAGNOSIS — G894 Chronic pain syndrome: Secondary | ICD-10-CM

## 2018-12-07 DIAGNOSIS — F99 Mental disorder, not otherwise specified: Secondary | ICD-10-CM

## 2018-12-07 DIAGNOSIS — E039 Hypothyroidism, unspecified: Secondary | ICD-10-CM

## 2018-12-07 MED ORDER — ATENOLOL 25 MG PO TABS: ORAL_TABLET | ORAL | 5 refills | Status: DC

## 2018-12-07 MED ORDER — ROSUVASTATIN CALCIUM 5 MG PO TABS
5.0000 mg | ORAL_TABLET | ORAL | 3 refills | Status: DC
Start: 2018-12-07 — End: 2024-06-12

## 2018-12-07 MED ORDER — AMLODIPINE BESYLATE 2.5 MG PO TABS: 2.5000 mg | ORAL_TABLET | Freq: Every day | ORAL | 3 refills | Status: DC

## 2018-12-07 MED ORDER — LEVOTHYROXINE SODIUM 50 MCG PO TABS: 25.0000 ug | ORAL_TABLET | Freq: Every day | ORAL | 5 refills | Status: DC

## 2018-12-07 MED ORDER — HYDROXYZINE HCL 50 MG PO TABS: 50.0000 mg | ORAL_TABLET | Freq: Every day | ORAL | 5 refills | Status: DC

## 2018-12-07 NOTE — Progress Notes (Signed)
Subjective  CC:  Chief Complaint  Patient presents with  . Follow-up  . Hypothyroidism  . Fibromyalgia    She states that Lyrica is barely helping with pain and she is unable to sleep    HPI: Joann Ray is a 73 y.o. female who presents to the office today to address the problems listed above in the chief complaint.  She is here today and is very rushed.  She says she has workers coming in the home and 10 minutes.  Hypothyroidism follow-up: Taking 25 mcg daily.  Continues to report that if she takes more her pain is worse.  She was referred to endocrinology but had to reschedule her appointment.  Now needs a new referral.  She says she will go.  She will not increase her dose currently.  Fibromyalgia and chronic pain: Continue to be active.  Lyrica was helping but now states that it is not.  She refuses to go to a higher dose.  Anxiety and panic: Requesting Klonopin for sleep.  Says her brain will not shut down.  Has never seen a psychiatrist.  Familial hyperlipidemia, uncontrolled.  Says she is tolerating the Crestor twice weekly.  She will go higher.  Request refills of her antihypertensives. Assessment  1. Acquired hypothyroidism   2. Chronic pain syndrome   3. GAD (generalized anxiety disorder)   4. Fibromyalgia   5. Insomnia due to other mental disorder   6. Essential hypertension   7. Paroxysmal SVT (supraventricular tachycardia) ([facility])      Plan   Hypothyroidism: Uncontrolled.  Patient says she cannot tolerate levothyroxine.  Again made a referral to endocrinology for assistance managing her uncontrolled hypothyroidism and intolerance to medications.  Recheck TSH today.  Again explained risks of uncontrolled thyroid levels.  Chronic pain: Continue Lyrica.  She will titrate further.  General anxiety disorder and poor sleep: Start hydroxyzine nightly.  May benefit from psychiatric referral.  Hyperlipidemia: Continue Crestor twice weekly.  Hypertension: This  medical condition is well controlled. There are no signs of complications, medication side effects, or red flags. Patient is instructed to continue the current treatment plan without change in therapies or medications.,  Refill medications.  Continue as needed beta-blocker for PSVT.  This medical condition is well controlled. There are no signs of complications, medication side effects, or red flags. Patient is instructed to continue the current treatment plan without change in therapies or medications.  Follow up: Return in about 3 months (around 03/08/2019) for complete physical.  01/09/2019  Orders Placed This Encounter  Procedures  . TSH  . Ambulatory referral to Endocrinology   Meds ordered this encounter  Medications  . rosuvastatin (CRESTOR) 5 MG tablet    Sig: Take 1 tablet (5 mg total) by mouth 2 (two) times a week.    Dispense:  90 tablet    Refill:  3  . hydrOXYzine (ATARAX/VISTARIL) 50 MG tablet    Sig: Take 1 tablet (50 mg total) by mouth at bedtime.    Dispense:  30 tablet    Refill:  5      I reviewed the patients updated PMH, FH, and SocHx.    Patient Active Problem List   Diagnosis Date Noted  . Adhesive capsulitis of right shoulder 09/08/2018  . Paroxysmal SVT (supraventricular tachycardia) ([place]) 09/08/2018  . Osteoarthritis of right knee 05/27/2016  . COPD (chronic obstructive pulmonary disease) ([place]) 03/02/2016  . Essential familial hypercholesterolemia 03/02/2016  . IBS (irritable bowel syndrome) 03/02/2016  .  Migraine headache 03/02/2016  . Chronic fatigue syndrome 03/02/2016  . Vitamin D deficiency 01/15/2015  . Chronic back pain 02/13/2014  . PVD (peripheral vascular disease) ([place]) 08/16/2012  . Menopause syndrome 02/28/2012  . Chronic pain 07/27/2011  . Essential hypertension 12/07/2010  . Insomnia 09/07/2010  . Hypothyroidism 07/06/2010  . GAD (generalized anxiety disorder) 07/06/2010  . Fibromyalgia 07/06/2010  . Osteoporosis 07/06/2010    Current Meds  Medication Sig  . albuterol (PROAIR HFA) 108 (90 Base) MCG/ACT inhaler INHALE 2 PUFFS INTO THE LUNGS TWICE A DAY  . amLODipine (NORVASC) 2.5 MG tablet Take 1 tablet by mouth daily.  Marland Kitchen atenolol (TENORMIN) 25 MG tablet Take one tablet twice a day as needed for rapid heart rate.  . dicyclomine (BENTYL) 10 MG capsule Take one capsule 3 times a day as needed for cramps.  . furosemide (LASIX) 20 MG tablet Take 20 mg by mouth daily.  Marland Kitchen losartan (COZAAR) 100 MG tablet Take 1 tablet by mouth daily.  . ondansetron (ZOFRAN) 4 MG tablet TAKE 1 TABLET BY MOUTH EVERY 6 HOURS AS NEEDED FOR NAUSEA AND VOMITING  . pregabalin (LYRICA) 150 MG capsule Take 1 capsule (150 mg total) by mouth 2 (two) times daily. Increasing up from 50 bid  . rosuvastatin (CRESTOR) 5 MG tablet Take 1 tablet (5 mg total) by mouth 2 (two) times a week.  . [DISCONTINUED] rosuvastatin (CRESTOR) 5 MG tablet TAKE 1 TABLET BY MOUTH EVERY DAY    Allergies: Patient is allergic to penicillins and diazepam. Family History: Patient family history includes Breast cancer in her paternal aunt; Colon cancer (age of onset: 54) in her sister; Dementia in her mother; Diabetes in her mother; Skin cancer in her father; Stomach cancer in her paternal aunt; Stroke in her mother and sister. Social History:  Patient  reports that she quit smoking about 9 years ago. Her smoking use included cigarettes. She has a 20.00 pack-year smoking history. She has never used smokeless tobacco. She reports that she does not drink alcohol or use drugs.  Review of Systems: Constitutional: Negative for fever malaise or anorexia Cardiovascular: negative for chest pain Respiratory: negative for SOB or persistent cough Gastrointestinal: negative for abdominal pain  Objective  Vitals: BP 124/70   Pulse (!) 47   Temp 98.2 F (36.8 C) (Oral)   Resp 16   Ht 5\' 2"  (1.575 m)   Wt 130 lb (59 kg)   SpO2 98%   BMI 23.78 kg/m  General: no acute distress ,  A&Ox3 Patient defers exam due to time constraints.    Commons side effects, risks, benefits, and alternatives for medications and treatment plan prescribed today were discussed, and the patient expressed understanding of the given instructions. Patient is instructed to call or message via MyChart if he/she has any questions or concerns regarding our treatment plan. No barriers to understanding were identified. We discussed Red Flag symptoms and signs in detail. Patient expressed understanding regarding what to do in case of urgent or emergency type symptoms.   Medication list was reconciled, printed and provided to the patient in AVS. Patient instructions and summary information was reviewed with the patient as documented in the AVS. This note was prepared with assistance of Dragon voice recognition software. Occasional wrong-word or sound-a-like substitutions may have occurred due to the inherent limitations of voice recognition software

## 2018-12-07 NOTE — Patient Instructions (Signed)
Please return in 3 months for for your annual complete physical; please come fasting.  If you have any questions or concerns, please don't hesitate to send me a message via MyChart or call the office at [phone]. Thank you for visiting with us today! It's our pleasure caring for you.   

## 2018-12-13 DIAGNOSIS — E063 Autoimmune thyroiditis: Secondary | ICD-10-CM | POA: Insufficient documentation

## 2019-01-09 ENCOUNTER — Ambulatory Visit: Payer: Medicare Other | Admitting: Family Medicine

## 2019-01-17 ENCOUNTER — Ambulatory Visit: Payer: Medicare Other

## 2019-02-26 ENCOUNTER — Other Ambulatory Visit: Payer: Self-pay | Admitting: Family Medicine

## 2019-03-06 ENCOUNTER — Encounter: Payer: Medicare Other | Admitting: Family Medicine

## 2019-05-03 ENCOUNTER — Other Ambulatory Visit: Payer: Self-pay | Admitting: Family Medicine

## 2019-05-03 NOTE — Telephone Encounter (Signed)
Copied from [place] [id number]. Topic: Quick Communication - Rx Refill/Question >> May 03, 2019  9:51 AM [name] wrote: Medication: Lyrica   Has the patient contacted their pharmacy? yes (Agent: If no, request that the patient contact the pharmacy for the refill.) (Agent: If yes, when and what did the pharmacy advise?) pt stated she is switching PCP and can't get in until 6.11.20 to see new PCP. Pt need medication until she can see her new PCP  Preferred Pharmacy (with phone number or street name): [facility]  Agent: Please be advised that RX refills may take up to 3 business days. We ask that you follow-up with your pharmacy.

## 2019-05-04 MED ORDER — PREGABALIN 150 MG PO CAPS: 150.0000 mg | ORAL_CAPSULE | Freq: Two times a day (BID) | ORAL | 0 refills | Status: DC

## 2019-05-04 NOTE — Telephone Encounter (Signed)
Last ov 11/2017. Overdue for HM/cpe visit. Please call her to schedule. In office. Refilled 1 month supply only. Thanks.

## 2019-07-16 ENCOUNTER — Ambulatory Visit: Payer: Self-pay

## 2019-07-16 ENCOUNTER — Telehealth: Payer: Self-pay | Admitting: Orthopaedic Surgery

## 2019-07-16 ENCOUNTER — Encounter: Payer: Self-pay | Admitting: Orthopaedic Surgery

## 2019-07-16 ENCOUNTER — Ambulatory Visit (INDEPENDENT_AMBULATORY_CARE_PROVIDER_SITE_OTHER): Payer: Medicare Other | Admitting: Orthopaedic Surgery

## 2019-07-16 VITALS — Ht 62.0 in | Wt 135.0 lb

## 2019-07-16 DIAGNOSIS — G8929 Other chronic pain: Secondary | ICD-10-CM

## 2019-07-16 DIAGNOSIS — M25561 Pain in right knee: Secondary | ICD-10-CM | POA: Diagnosis not present

## 2019-07-16 DIAGNOSIS — M1711 Unilateral primary osteoarthritis, right knee: Secondary | ICD-10-CM | POA: Diagnosis not present

## 2019-07-16 MED ORDER — ACETAMINOPHEN-CODEINE #3 300-30 MG PO TABS: 1.0000 | ORAL_TABLET | Freq: Three times a day (TID) | ORAL | 0 refills | Status: DC | PRN

## 2019-07-16 NOTE — Telephone Encounter (Signed)
Please advise 

## 2019-07-16 NOTE — Telephone Encounter (Signed)
I just did send some in.

## 2019-07-16 NOTE — Telephone Encounter (Signed)
Patient wanted to know if Dr. Ninfa Linden was going to call in Tylenol #3 to her pharmacy.  She did not see if on her MyChart after leaving her appointment.  CB#[phone].  Thank you.

## 2019-07-16 NOTE — Progress Notes (Signed)
Office Visit Note   Patient: Joann Ray           Date of Birth: 02-18-1946           MRN: [id number] Visit Date: 07/16/2019              Requested by: No referring provider defined for this encounter. PCP: Patient, No Pcp Per   Assessment & Plan: Visit Diagnoses:  1. Chronic pain of right knee   2. Unilateral primary osteoarthritis, right knee     Plan: At this point she is tried and failed conservative treatment for well over 3 to 5 years for her right knee.  She would like to proceed with knee replacement surgery.  We had a long thorough discussion about this.  We talked about her interoperative and postoperative course.  We did discuss in detail the risk and benefits of surgery.  She does state that she would like to see her cardiologist in the future because she sees them once a year for her blood pressure but she would like [place] to go ahead and set up the surgery but she has had no other issues or changes in her medical status.  We will work on getting this scheduled.  All question concerns were answered and addressed.  Follow-Up Instructions: Return for 2 weeks post-op.   Orders:  Orders Placed This Encounter  Procedures  . XR Knee 1-2 Views Right   No orders of the defined types were placed in this encounter.     Procedures: No procedures performed   Clinical Data: No additional findings.   Subjective: Chief Complaint  Patient presents with  . Right Knee - Pain  The patient is someone is well-known to me.  We have been seeing her for several years now.  She has debilitating arthritis in the right knee.  She is try to hold off on any type of surgery while she was dealing with a husband who was critically ill.  She is tried and failed all forms conservative treatment at this point including numerous steroid injections and anti-inflammatories.  She also has fibromyalgia and high blood pressure.  She is a very thin individual.  She can only take  anti-inflammatories for short amount of time.  She is worked on activity modification as well as quad strengthening exercises and therapy.  At this point her right knee pain is daily.  It is 10 out of 10.  Her right knee pain is detriment affecting her mobility, her quality of life and her actives of daily living to the point she would like to proceed with total knee arthroplasty.  We have been discussing this with her again for at least 3 years.  HPI  Review of Systems She currently denies any headache, chest pain, shortness of breath, fever, chills, nausea, vomiting  Objective: Vital Signs: Ht 5\' 2"  (1.575 m)   Wt 135 lb (61.2 kg)   BMI 24.69 kg/m   Physical Exam She is alert and orient x3 and in no acute distress Ortho Exam Examination of her right knee shows significant varus malalignment that is correctable.  She has patellofemoral crepitation as well as medial and lateral joint line tenderness and patellofemoral pain.  She has good range of motion of her right knee and it feels ligamentously stable. Specialty Comments:  No specialty comments available.  Imaging: Xr Knee 1-2 Views Right  Result Date: 07/16/2019 2 views of the right knee show significant tricompartmental arthritic changes.  The  medial joint space is completely loss and bone-on-bone.  There periarticular osteophytes throughout the knee and significant patellofemoral disease.    PMFS History: Patient Active Problem List   Diagnosis Date Noted  . Adhesive capsulitis of right shoulder 09/08/2018  . Paroxysmal SVT (supraventricular tachycardia) ([place]) 09/08/2018  . Osteoarthritis of right knee 05/27/2016  . COPD (chronic obstructive pulmonary disease) ([place]) 03/02/2016  . Essential familial hypercholesterolemia 03/02/2016  . IBS (irritable bowel syndrome) 03/02/2016  . Migraine headache 03/02/2016  . Chronic fatigue syndrome 03/02/2016  . Vitamin D deficiency 01/15/2015  . Chronic back pain 02/13/2014  . PVD  (peripheral vascular disease) ([place]) 08/16/2012  . Menopause syndrome 02/28/2012  . Chronic pain 07/27/2011  . Essential hypertension 12/07/2010  . Insomnia 09/07/2010  . Hypothyroidism 07/06/2010  . GAD (generalized anxiety disorder) 07/06/2010  . Fibromyalgia 07/06/2010  . Osteoporosis 07/06/2010   Past Medical History:  Diagnosis Date  . Anemia   . Anxiety   . Arrhythmia     AVNRT  . Asthma   . COPD (chronic obstructive pulmonary disease) ([place])   . Depression   . Fibromyalgia   . Hyperlipidemia   . Hypertension   . Hypothyroidism   . Migraine     Family History  Problem Relation Age of Onset  . Colon cancer Sister 56  . Diabetes Mother   . Stroke Mother   . Dementia Mother   . Skin cancer Father   . Stroke Sister   . Breast cancer Paternal Aunt   . Stomach cancer Paternal Aunt     Past Surgical History:  Procedure Laterality Date  . ABDOMINAL HYSTERECTOMY    . APPENDECTOMY    . CARDIAC ELECTROPHYSIOLOGY MAPPING AND ABLATION  08/28/2010   EPS/RFA of AVNRT  - Dr. Crissie Sickles  . CORONARY STENT PLACEMENT  09/2012  . IR GENERIC HISTORICAL  07/29/2016   IR RADIOLOGY PERIPHERAL GUIDED IV START 07/29/2016 Corrie Mckusick, DO [facility]INTER[facility]  . IR GENERIC HISTORICAL  07/29/2016   IR US GUIDE VASC ACCESS RIGHT 07/29/2016 Corrie Mckusick, DO [facility]  . NASAL SINUS SURGERY  1993  . PERCUTANEOUS STENT INTERVENTION Left 10/18/2012   Procedure: PERCUTANEOUS STENT INTERVENTION;  Surgeon: Sherren Mocha, MD;  Location: [facility] C[facility] LAB;  Service: Cardiovascular;  Laterality: Left;  . UNILATERAL UPPER EXTREMEITY ANGIOGRAM Left 10/18/2012   Procedure: UNILATERAL UPPER Anselmo Rod;  Surgeon: Sherren Mocha, MD;  Location: [facility] CATH LAB;  Service: Cardiovascular;  Laterality: Left;   Social History   Occupational History  . Occupation: DISABLED    Employer: UNEMPLOYED  Tobacco Use  . Smoking status: Former Smoker    Packs/day: 0.50    Years: 40.00    Pack years: 20.00    Types:  Cigarettes    Quit date: 11/29/2009    Years since quitting: 9.6  . Smokeless tobacco: Never Used  . Tobacco comment: September 2011, again Oct 2013  Substance and Sexual Activity  . Alcohol use: No  . Drug use: No  . Sexual activity: Not on file

## 2019-08-01 ENCOUNTER — Other Ambulatory Visit: Payer: Self-pay | Admitting: Orthopaedic Surgery

## 2019-08-01 NOTE — Telephone Encounter (Signed)
Please advise 

## 2019-08-09 ENCOUNTER — Ambulatory Visit (INDEPENDENT_AMBULATORY_CARE_PROVIDER_SITE_OTHER): Payer: Medicare Other | Admitting: Orthopaedic Surgery

## 2019-08-09 ENCOUNTER — Other Ambulatory Visit: Payer: Self-pay

## 2019-08-09 ENCOUNTER — Ambulatory Visit: Payer: Medicare Other | Admitting: Surgery

## 2019-08-09 ENCOUNTER — Encounter: Payer: Self-pay | Admitting: Orthopaedic Surgery

## 2019-08-09 ENCOUNTER — Ambulatory Visit: Payer: Self-pay

## 2019-08-09 DIAGNOSIS — M5431 Sciatica, right side: Secondary | ICD-10-CM | POA: Diagnosis not present

## 2019-08-09 DIAGNOSIS — M25551 Pain in right hip: Secondary | ICD-10-CM

## 2019-08-09 MED ORDER — METHYLPREDNISOLONE 4 MG PO TABS: ORAL_TABLET | ORAL | 0 refills | Status: DC

## 2019-08-09 MED ORDER — CELECOXIB 200 MG PO CAPS: 200.0000 mg | ORAL_CAPSULE | Freq: Two times a day (BID) | ORAL | 1 refills | Status: DC | PRN

## 2019-08-09 NOTE — Progress Notes (Signed)
Office Visit Note   Patient: Joann Ray           Date of Birth: 06-Oct-1946           MRN: [id number] Visit Date: 08/09/2019              Requested by: No referring provider defined for this encounter. PCP: Patient, No Pcp Per   Assessment & Plan: Visit Diagnoses:  1. Pain in right hip   2. Sciatica, right side     Plan: I do feel that she is experiencing some low back pain and sciatica as a relates to her trying to offload her right knee.  On her try Celebrex twice a day as well as a steroid taper to see if this will help get her through the pain prior to her knee replacement surgery.  All question concerns were answered and addressed.  We will still proceed with her knee replacement on September 22.  We would see her back 2 weeks postoperative after that.  Follow-Up Instructions: Return for 2 weeks post-op.   Orders:  Orders Placed This Encounter  Procedures   XR HIP UNILAT W OR W/O PELVIS 1V RIGHT   Meds ordered this encounter  Medications   methylPREDNISolone (MEDROL) 4 MG tablet    Sig: Medrol dose pack. Take as instructed    Dispense:  21 tablet    Refill:  0   celecoxib (CELEBREX) 200 MG capsule    Sig: Take 1 capsule (200 mg total) by mouth 2 (two) times daily between meals as needed.    Dispense:  60 capsule    Refill:  1      Procedures: No procedures performed   Clinical Data: No additional findings.   Subjective: Chief Complaint  Patient presents with   Right Knee - Follow-up  The patient is well-known to me.  She is actually scheduled for right total knee arthroplasty on 08/21/2019 which is just 2 weeks.  She has been developing right-sided hip pain but she points to sciatic area as a source of her pain.  This is worsened for her and she is worried about this before her surgery.  She denies any pain in her groin.  She does report right knee pain but that is to be expected given the severity of her arthritis and we do again have her  scheduled for a right total knee arthroplasty on September 22.  HPI  Review of Systems She currently denies any headache, chest pain, shortness of breath, fever, chills, nausea, vomiting  Objective: Vital Signs: There were no vitals taken for this visit.  Physical Exam She is alert and orient x3 and in no acute distress but obvious discomfort Ortho Exam She is a very petite individual.  Her right hip moves fluidly and smoothly with no pain in the groin.  Her pain seems to be when she first gets up and it seems to be more low back and sciatic region.  Her knee shows severe medial joint line tenderness with good range of motion. Specialty Comments:  No specialty comments available.  Imaging: Xr Hip Unilat W Or W/o Pelvis 1v Right  Result Date: 08/09/2019 An AP pelvis and lateral right hip shows no acute findings of the right hip.  The joint space is well located and well maintained.  I did independently reviewed plain films of her lumbar spine that were done several years ago and it did show arthritic changes in the posterior elements that L4-L5  and L5-S1.  PMFS History: Patient Active Problem List   Diagnosis Date Noted   Adhesive capsulitis of right shoulder 09/08/2018   Paroxysmal SVT (supraventricular tachycardia) (HCC) 09/08/2018   Osteoarthritis of right knee 05/27/2016   COPD (chronic obstructive pulmonary disease) ([place]) 03/02/2016   Essential familial hypercholesterolemia 03/02/2016   IBS (irritable bowel syndrome) 03/02/2016   Migraine headache 03/02/2016   Chronic fatigue syndrome 03/02/2016   Vitamin D deficiency 01/15/2015   Chronic back pain 02/13/2014   PVD (peripheral vascular disease) ([place]) 08/16/2012   Menopause syndrome 02/28/2012   Chronic pain 07/27/2011   Essential hypertension 12/07/2010   Insomnia 09/07/2010   Hypothyroidism 07/06/2010   GAD (generalized anxiety disorder) 07/06/2010   Fibromyalgia 07/06/2010   Osteoporosis  07/06/2010   Past Medical History:  Diagnosis Date   Anemia    Anxiety    Arrhythmia     AVNRT   Asthma    COPD (chronic obstructive pulmonary disease) ([place])    Depression    Fibromyalgia    Hyperlipidemia    Hypertension    Hypothyroidism    Migraine     Family History  Problem Relation Age of Onset   Colon cancer Sister 37   Diabetes Mother    Stroke Mother    Dementia Mother    Skin cancer Father    Stroke Sister    Breast cancer Paternal Aunt    Stomach cancer Paternal Aunt     Past Surgical History:  Procedure Laterality Date   ABDOMINAL HYSTERECTOMY     APPENDECTOMY     CARDIAC ELECTROPHYSIOLOGY MAPPING AND ABLATION  08/28/2010   EPS/RFA of AVNRT  - Dr. Crissie Sickles   CORONARY STENT PLACEMENT  09/2012   IR GENERIC HISTORICAL  07/29/2016   IR RADIOLOGY PERIPHERAL GUIDED IV START 07/29/2016 Corrie Mckusick, DO [facility]   IR GENERIC HISTORICAL  07/29/2016   IR US GUIDE VASC ACCESS RIGHT 07/29/2016 Corrie Mckusick, DO [facility]   NASAL SINUS SURGERY  1993   PERCUTANEOUS STENT INTERVENTION Left 10/18/2012   Procedure: PERCUTANEOUS STENT INTERVENTION;  Surgeon: Sherren Mocha, MD;  Location[facility] C[facility] LAB;  Service: Cardiovascular;  Laterality: Left;   UNILATERAL UPPER EXTREMEITY ANGIOGRAM Left 10/18/2012   Procedure: UNILATERAL UPPER Anselmo Rod;  Surgeon: Sherren Mocha, MD;  Location: [facility] CATH LAB;  Service: Cardiovascular;  Laterality: Left;   Social History   Occupational History   Occupation: DISABLED    Employer: UNEMPLOYED  Tobacco Use   Smoking status: Former Smoker    Packs/day: 0.50    Years: 40.00    Pack years: 20.00    Types: Cigarettes    Quit date: 11/29/2009    Years since quitting: 9.6   Smokeless tobacco: Never Used   Tobacco comment: September 2011, again Oct 2013  Substance and Sexual Activity   Alcohol use: No   Drug use: No   Sexual activity: Not on file

## 2019-08-13 ENCOUNTER — Other Ambulatory Visit: Payer: Self-pay | Admitting: Orthopaedic Surgery

## 2019-08-13 NOTE — Telephone Encounter (Signed)
Please advise 

## 2019-08-14 ENCOUNTER — Other Ambulatory Visit: Payer: Self-pay | Admitting: Physician Assistant

## 2019-08-15 ENCOUNTER — Other Ambulatory Visit: Payer: Self-pay

## 2019-08-15 ENCOUNTER — Encounter (HOSPITAL_COMMUNITY)
Admission: RE | Admit: 2019-08-15 | Discharge: 2019-08-15 | Disposition: A | Discharge status: Home / Self Care | Payer: Medicare Other | Source: Ambulatory Visit | Attending: Orthopaedic Surgery | Admitting: Orthopaedic Surgery

## 2019-08-15 ENCOUNTER — Encounter (HOSPITAL_COMMUNITY): Payer: Self-pay

## 2019-08-15 DIAGNOSIS — R9431 Abnormal electrocardiogram [ECG] [EKG]: Secondary | ICD-10-CM | POA: Insufficient documentation

## 2019-08-15 DIAGNOSIS — R001 Bradycardia, unspecified: Secondary | ICD-10-CM | POA: Insufficient documentation

## 2019-08-15 DIAGNOSIS — Z01818 Encounter for other preprocedural examination: Secondary | ICD-10-CM | POA: Diagnosis not present

## 2019-08-15 HISTORY — DX: Nausea with vomiting, unspecified: R11.2

## 2019-08-15 HISTORY — DX: Other specified postprocedural states: Z98.890

## 2019-08-15 LAB — BASIC METABOLIC PANEL
Anion gap: 11 (ref 5–15)
BUN: 13 mg/dL (ref 8–23)
CO2: 27 mmol/L (ref 22–32)
Calcium: 8.9 mg/dL (ref 8.9–10.3)
Chloride: 100 mmol/L (ref 98–111)
Creatinine, Ser: 0.95 mg/dL (ref 0.44–1.00)
GFR calc Af Amer: >60 mL/min (ref >60)
GFR calc non Af Amer: >60 mL/min (ref >60)
Glucose, Bld: 94 mg/dL (ref 70–99)
Potassium: 4.1 mmol/L (ref 3.5–5.1)
Sodium: 138 mmol/L (ref 135–145)

## 2019-08-15 LAB — CBC
HCT: 36.8 % (ref 36.0–46.0)
Hemoglobin: 11.3 g/dL — ABNORMAL LOW (ref 12.0–15.0)
MCH: 30.6 pg (ref 26.0–34.0)
MCHC: 30.7 g/dL (ref 30.0–36.0)
MCV: 99.7 fL (ref 80.0–100.0)
Platelets: 269 10*3/uL (ref 150–400)
RBC: 3.69 MIL/uL — ABNORMAL LOW (ref 3.87–5.11)
RDW: 15 % (ref 11.5–15.5)
WBC: 8.7 10*3/uL (ref 4.0–10.5)
nRBC: 0 % (ref 0.0–0.2)

## 2019-08-15 LAB — SURGICAL PCR SCREEN
MRSA, PCR: NEGATIVE
Staphylococcus aureus: NEGATIVE

## 2019-08-15 NOTE — Progress Notes (Addendum)
  Coronavirus Screening Scheduled for COVID test on Friday. Have you experienced the following symptoms:  Cough yes/no: No Fever (>100.47F)  yes/no: No Runny nose yes/no: No Sore throat yes/no: No Difficulty breathing/shortness of breath  yes/no: No Loss of smell or taste-no Have you or a family member traveled in the last 14 days and where? yes/no: No  PCP - Dr Woody Seller  Cardiologist - Dr Sherren Mocha  Endocrinology-Dr Francetta Found  Chest x-ray - NA  EKG - today   Stress Test - Done more than 20 yrs back at [facility].  ECHO - 03-27-18  Cardiac Cath - denies  AICD-denies PM-denies LOOP-denies  Sleep Study -denies  CPAP - denies  LABS-CBC,BMP,PCR  ASA-denies  ERAS-Pre-surgery Ensure with instructions  HA1C-denies Fasting Blood Sugar -  Checks Blood Sugar _____ times a day  Anesthesia-Y  H/O Arrhythmia  Pt denies having palpitations,chest pain, sob, or fever at this time. All instructions explained to the pt, with a verbal understanding of the material. Pt agrees to go over the instructions while at home for a better understanding. Pt also instructed to self quarantine after being tested for COVID-19. The opportunity to ask questions was provided.

## 2019-08-15 NOTE — Pre-Procedure Instructions (Addendum)
   Joann Ray  08/15/2019     [id number] - [place], [place] - [address] [place] [address] Phone: [phone] Fax: [phone]   Your procedure is scheduled on Tuesday, August 21, 2019  Report to [facility] Admitting at 10:00 A.M.  Call this number if you have problems the morning of surgery:  [phone]   Remember: Brush your teeth the morning of surgery with your regular toothpaste.   Do not eat after midnight  Monday, August 20, 2019   You may drink clear liquids until 9:00A.M..  Clear liquids allowed are:  Water, Juice (non-citric and without pulp), Carbonated beverages, Clear Tea, Black Coffee only, Plain Jell-O only, Gatorade and Plain Popsicles only Please finish your Ensure Pre-Surgery Drink by 9:00A.M. (Do not sip)    Take these medicines the morning of surgery with A SIP OF WATER : amLODipine (NORVASC),   levothyroxine (SYNTHROID),    methylPREDNISolone (MEDROL),     pregabalin (LYRICA),     albuterol (PROAIR HFA)  Inhaler ( Bring inhaler in with you on day of surgery )  If needed: TYLENOL #3 for pain,  atenolol (TENORMIN) for rapid heart rate,   ondansetron (ZOFRAN) for nausea or vomiting  Stop taking Aspirin (unless otherwise advised by surgeon), vitamins, fish oil and herbal medications. Do not take any NSAIDs [id number] (CELEBREX),  Ibuprofen, Advil, Naproxen (Aleve), Motrin, BC and Goody Powder; stop now.   Do not wear jewelry, make-up or nail polish.  Do not wear lotions, powders, or perfumes, or deodorant.  Do not shave 48 hours prior to surgery.    Do not bring valuables to the hospital.  [facility] is not responsible for any belongings or valuables.  Contacts, dentures or bridgework may not be worn into surgery.  Leave your suitcase in the car.  After surgery it may be brought to your room. Special instructions: Shower the night before and morning of  surgery with the CHG wash provided. See " [facility] Preparing For Surgery " sheet. Please read over the following fact sheets that you were given. Pain Booklet, Coughing and Deep Breathing, Total Joint Packet, MRSA Information and Surgical Site Infection Prevention

## 2019-08-15 NOTE — Pre-Procedure Instructions (Signed)
[id number] - [place], [place] - [address] [place] [address] Phone: [phone] Fax: [phone]      Your procedure is scheduled on  08-21-19   Report to [facility] Main Entrance "A" at 10 A.M., and check in at the Admitting office.  Call this number if you have problems the morning of surgery:  [phone]  Call [phone] if you have any questions prior to your surgery date Monday-Friday 8am-4pm    Remember:  Do not eat after midnight the night before your surgery  You may drink clear liquids until 9 AMthe morning of your surgery.   Clear liquids allowed are: Water, Non-Citrus Juices (without pulp), Carbonated Beverages, Clear Tea, Black Coffee Only, and Gatorade Please complete your PRE-SURGERY ENSURE that was provided to you by 9 AM  the morning of surgery.  Please, if able, drink it in one setting. DO NOT SIP.   Take these medicines the morning of surgery with A SIP OF WATER : sertraline (ZOLOFT) pregabalin (LYRICA)  pantoprazole (PROTONIX) levothyroxine (SYNTHROID, LEVOTHROID) gabapentin (NEURONTIN)  famotidine (PEPCID)  amLODipine (NORVASC)   clonazePAM (KLONOPIN)as needed albuterol (PROAIR HFA)  acetaminophen-codeine (TYLENOL #3)as needed atenolol (TENORMIN)as needed dicyclomine (BENTYL)as needed  7 days prior to surgery STOP taking any Aspirin (unless otherwise instructed by your surgeon), Aleve, Naproxen, Ibuprofen, Motrin, Advil, CELEBREX Goody's, BC's, all herbal medications, fish oil, and all vitamins.    The Morning of Surgery  Do not wear jewelry, make-up or nail polish.  Do not wear lotions, powders, or perfumes, or deodorant  Do not shave 48 hours prior to surgery.    Do not bring valuables to the hospital.  [facility] is not responsible for any belongings or valuables.  If you are a smoker, DO NOT Smoke 24 hours prior to surgery IF you wear a CPAP at night please bring  your mask, tubing, and machine the morning of surgery   Remember that you must have someone to transport you home after your surgery, and remain with you for 24 hours if you are discharged the same day.   Contacts, glasses, hearing aids, dentures or bridgework may not be worn into surgery.    Leave your suitcase in the car.  After surgery it may be brought to your room.  For patients admitted to the hospital, discharge time will be determined by your treatment team.  Patients discharged the day of surgery will not be allowed to drive home.    Special instructions:   [place]- Preparing For Surgery  Before surgery, you can play an important role. Because skin is not sterile, your skin needs to be as free of germs as possible. You can reduce the number of germs on your skin by washing with CHG (chlorahexidine gluconate) Soap before surgery.  CHG is an antiseptic cleaner which kills germs and bonds with the skin to continue killing germs even after washing.    Oral Hygiene is also important to reduce your risk of infection.  Remember - BRUSH YOUR TEETH THE MORNING OF SURGERY WITH YOUR REGULAR TOOTHPASTE  Please do not use if you have an allergy to CHG or antibacterial soaps. If your skin becomes reddened/irritated stop using the CHG.  Do not shave (including legs and underarms) for at least 48 hours prior to first CHG shower. It is OK to shave your face.  Please follow these instructions carefully.   1. Shower the [employer]  BEFORE SURGERY and the MORNING OF SURGERY with CHG Soap.   2. If you chose to wash your hair, wash your hair first as usual with your normal shampoo.  3. After you shampoo, rinse your hair and body thoroughly to remove the shampoo.  4. Use CHG as you would any other liquid soap. You can apply CHG directly to the skin and wash gently with a scrungie or a clean washcloth.   5. Apply the CHG Soap to your body ONLY FROM THE NECK DOWN.  Do not use on open wounds or open  sores. Avoid contact with your eyes, ears, mouth and genitals (private parts). Wash Face and genitals (private parts)  with your normal soap.   6. Wash thoroughly, paying special attention to the area where your surgery will be performed.  7. Thoroughly rinse your body with warm water from the neck down.  8. DO NOT shower/wash with your normal soap after using and rinsing off the CHG Soap.  9. Pat yourself dry with a CLEAN TOWEL.  10. Wear CLEAN PAJAMAS to bed the night before surgery, wear comfortable clothes the morning of surgery  11. Place CLEAN SHEETS on your bed the night of your first shower and DO NOT SLEEP WITH PETS.    Day of Surgery:  Do not apply any deodorants/lotions. Please shower the morning of surgery with the CHG soap  Please wear clean clothes to the hospital/surgery center.   Remember to brush your teeth WITH YOUR REGULAR TOOTHPASTE.   Please read over the following fact sheets that you were given.

## 2019-08-16 NOTE — Anesthesia Preprocedure Evaluation (Addendum)
Anesthesia Evaluation  Patient identified by MRN, date of birth, ID band Patient awake    Reviewed: Allergy & Precautions, NPO status , Patient's Chart, lab work & pertinent test results  History of Anesthesia Complications (+) PONV and history of anesthetic complications  Airway Mallampati: II  TM Distance: >3 FB Neck ROM: Full    Dental no notable dental hx.    Pulmonary asthma , COPD, former smoker,    Pulmonary exam normal        Cardiovascular hypertension, Pt. on medications + Peripheral Vascular Disease  Normal cardiovascular exam     Neuro/Psych  Headaches, Anxiety Depression negative psych ROS   GI/Hepatic negative GI ROS, Neg liver ROS,   Endo/Other  Hypothyroidism   Renal/GU negative Renal ROS  negative genitourinary   Musculoskeletal  (+) Arthritis , Fibromyalgia -  Abdominal   Peds  Hematology  (+) anemia , Hgb 11.3   Anesthesia Other Findings Day of surgery medications reviewed with patient.  Reproductive/Obstetrics negative OB ROS                           Anesthesia Physical Anesthesia Plan  ASA: II  Anesthesia Plan: Spinal   Post-op Pain Management:  Regional for Post-op pain   Induction:   PONV Risk Score and Plan: 4 or greater and Treatment may vary due to age or medical condition, Ondansetron, Propofol infusion, Dexamethasone and Midazolam  Airway Management Planned: Natural Airway and Simple Face Mask  Additional Equipment: None  Intra-op Plan:   Post-operative Plan:   Informed Consent: I have reviewed the patients History and Physical, chart, labs and discussed the procedure including the risks, benefits and alternatives for the proposed anesthesia with the patient or authorized representative who has indicated his/her understanding and acceptance.     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 Also history of stable asymptomatic bradycardia. Last seen by cardiology 02/27/18, echo ordered at that time to evaluate murmur showed "Essentially normal study. Murmur related to flow in LV outflow tract, no significant heart valve problems noted."  TTE 03/27/18: - Left ventricle: Mild flow acceleration in LVOT No outflow   obstruction noted at rest. The cavity size was normal. Wall   thickness was normal. Systolic function was vigorous. The   estimated ejection fraction was in the range of 65% to 70%. - Mitral valve: Calcified annulus. Mildly thickened leaflets .)      Anesthesia Quick Evaluation

## 2019-08-17 ENCOUNTER — Other Ambulatory Visit (HOSPITAL_COMMUNITY)
Admission: RE | Admit: 2019-08-17 | Discharge: 2019-08-17 | Disposition: A | Discharge status: Home / Self Care | Payer: Medicare Other | Source: Ambulatory Visit | Attending: Orthopaedic Surgery | Admitting: Orthopaedic Surgery

## 2019-08-17 DIAGNOSIS — Z01812 Encounter for preprocedural laboratory examination: Secondary | ICD-10-CM | POA: Insufficient documentation

## 2019-08-17 DIAGNOSIS — Z20828 Contact with and (suspected) exposure to other viral communicable diseases: Secondary | ICD-10-CM | POA: Diagnosis not present

## 2019-08-18 LAB — NOVEL CORONAVIRUS, NAA (HOSP ORDER, SEND-OUT TO REF LAB; TAT 18-24 HRS): SARS-CoV-2, NAA: NOT DETECTED

## 2019-08-20 MED ORDER — CLINDAMYCIN PHOSPHATE 900 MG/50ML IV SOLN
900.0000 mg | INTRAVENOUS | Status: AC
  Administered 2019-08-21: 900 mg via INTRAVENOUS
  Filled 2019-08-20: qty 50

## 2019-08-20 MED ORDER — TRANEXAMIC ACID-NACL 1000-0.7 MG/100ML-% IV SOLN
1000.0000 mg | INTRAVENOUS | Status: AC
  Administered 2019-08-21: 1000 mg via INTRAVENOUS
  Filled 2019-08-20: qty 100

## 2019-08-21 ENCOUNTER — Observation Stay (HOSPITAL_COMMUNITY): Payer: Medicare Other

## 2019-08-21 ENCOUNTER — Encounter (HOSPITAL_COMMUNITY)
Admission: AD | Disposition: A | Discharge status: Home Health | Payer: Self-pay | Source: Home / Self Care | Attending: Orthopaedic Surgery

## 2019-08-21 ENCOUNTER — Other Ambulatory Visit: Payer: Self-pay

## 2019-08-21 ENCOUNTER — Encounter (HOSPITAL_COMMUNITY): Payer: Self-pay | Admitting: Certified Registered"

## 2019-08-21 ENCOUNTER — Inpatient Hospital Stay (HOSPITAL_COMMUNITY)
Admission: AD | Admit: 2019-08-21 | Discharge: 2019-08-24 | DRG: 470 | Disposition: A | Discharge status: Home Health | Payer: Medicare Other | Attending: Orthopaedic Surgery | Admitting: Orthopaedic Surgery

## 2019-08-21 ENCOUNTER — Ambulatory Visit (HOSPITAL_COMMUNITY): Payer: Medicare Other | Admitting: Physician Assistant

## 2019-08-21 ENCOUNTER — Ambulatory Visit (HOSPITAL_COMMUNITY): Payer: Medicare Other | Admitting: Anesthesiology

## 2019-08-21 DIAGNOSIS — Z96651 Presence of right artificial knee joint: Secondary | ICD-10-CM

## 2019-08-21 DIAGNOSIS — J449 Chronic obstructive pulmonary disease, unspecified: Secondary | ICD-10-CM | POA: Diagnosis present

## 2019-08-21 DIAGNOSIS — E039 Hypothyroidism, unspecified: Secondary | ICD-10-CM | POA: Diagnosis present

## 2019-08-21 DIAGNOSIS — Z90710 Acquired absence of both cervix and uterus: Secondary | ICD-10-CM

## 2019-08-21 DIAGNOSIS — M797 Fibromyalgia: Secondary | ICD-10-CM | POA: Diagnosis present

## 2019-08-21 DIAGNOSIS — D62 Acute posthemorrhagic anemia: Secondary | ICD-10-CM | POA: Diagnosis not present

## 2019-08-21 DIAGNOSIS — F411 Generalized anxiety disorder: Secondary | ICD-10-CM | POA: Diagnosis present

## 2019-08-21 DIAGNOSIS — Z833 Family history of diabetes mellitus: Secondary | ICD-10-CM

## 2019-08-21 DIAGNOSIS — M1711 Unilateral primary osteoarthritis, right knee: Secondary | ICD-10-CM | POA: Diagnosis not present

## 2019-08-21 DIAGNOSIS — Z87891 Personal history of nicotine dependence: Secondary | ICD-10-CM

## 2019-08-21 DIAGNOSIS — E1151 Type 2 diabetes mellitus with diabetic peripheral angiopathy without gangrene: Secondary | ICD-10-CM | POA: Diagnosis present

## 2019-08-21 DIAGNOSIS — E785 Hyperlipidemia, unspecified: Secondary | ICD-10-CM | POA: Diagnosis present

## 2019-08-21 DIAGNOSIS — I10 Essential (primary) hypertension: Secondary | ICD-10-CM | POA: Diagnosis present

## 2019-08-21 DIAGNOSIS — Z955 Presence of coronary angioplasty implant and graft: Secondary | ICD-10-CM

## 2019-08-21 DIAGNOSIS — G8929 Other chronic pain: Secondary | ICD-10-CM | POA: Diagnosis present

## 2019-08-21 HISTORY — PX: TOTAL KNEE ARTHROPLASTY: SHX125

## 2019-08-21 HISTORY — DX: Family history of other specified conditions: Z84.89

## 2019-08-21 SURGERY — ARTHROPLASTY, KNEE, TOTAL
Anesthesia: Spinal | Site: Knee | Laterality: Right

## 2019-08-21 MED ORDER — DOCUSATE SODIUM 100 MG PO CAPS
100.0000 mg | ORAL_CAPSULE | Freq: Two times a day (BID) | ORAL | Status: DC
  Administered 2019-08-21 – 2019-08-24 (×6): 100 mg via ORAL
  Filled 2019-08-21 (×6): qty 1

## 2019-08-21 MED ORDER — PREGABALIN 75 MG PO CAPS
150.0000 mg | ORAL_CAPSULE | Freq: Two times a day (BID) | ORAL | Status: DC
  Filled 2019-08-21: qty 2

## 2019-08-21 MED ORDER — CLONAZEPAM 0.5 MG PO TABS
0.5000 mg | ORAL_TABLET | Freq: Two times a day (BID) | ORAL | Status: DC | PRN
  Administered 2019-08-21 – 2019-08-23 (×2): 0.5 mg via ORAL
  Filled 2019-08-21 (×2): qty 1

## 2019-08-21 MED ORDER — PANTOPRAZOLE SODIUM 40 MG PO TBEC
40.0000 mg | DELAYED_RELEASE_TABLET | Freq: Every day | ORAL | Status: DC
  Administered 2019-08-22 – 2019-08-23 (×2): 40 mg via ORAL
  Filled 2019-08-21 (×3): qty 1

## 2019-08-21 MED ORDER — 0.9 % SODIUM CHLORIDE (POUR BTL) OPTIME
TOPICAL | Status: DC | PRN
  Administered 2019-08-21: 1000 mL

## 2019-08-21 MED ORDER — METHOCARBAMOL 1000 MG/10ML IJ SOLN
500.0000 mg | Freq: Four times a day (QID) | INTRAVENOUS | Status: DC | PRN
  Filled 2019-08-21: qty 5

## 2019-08-21 MED ORDER — SERTRALINE HCL 50 MG PO TABS
50.0000 mg | ORAL_TABLET | Freq: Every day | ORAL | Status: DC
  Administered 2019-08-22 – 2019-08-24 (×3): 50 mg via ORAL
  Filled 2019-08-21 (×3): qty 1

## 2019-08-21 MED ORDER — ACETAMINOPHEN 500 MG PO TABS
1000.0000 mg | ORAL_TABLET | Freq: Once | ORAL | Status: AC
  Administered 2019-08-21: 1000 mg via ORAL

## 2019-08-21 MED ORDER — OXYCODONE HCL 5 MG/5ML PO SOLN: 5.0000 mg | Freq: Once | ORAL | Status: AC | PRN

## 2019-08-21 MED ORDER — OXYCODONE HCL 5 MG PO TABS
ORAL_TABLET | ORAL | Status: AC
  Filled 2019-08-21: qty 1

## 2019-08-21 MED ORDER — HYDROXYZINE HCL 25 MG PO TABS
50.0000 mg | ORAL_TABLET | Freq: Every day | ORAL | Status: DC
  Administered 2019-08-23: 50 mg via ORAL
  Filled 2019-08-21 (×3): qty 2

## 2019-08-21 MED ORDER — PROPOFOL 10 MG/ML IV BOLUS
INTRAVENOUS | Status: DC | PRN
  Administered 2019-08-21 (×2): 10 mg via INTRAVENOUS

## 2019-08-21 MED ORDER — ONDANSETRON HCL 4 MG/2ML IJ SOLN
4.0000 mg | Freq: Four times a day (QID) | INTRAMUSCULAR | Status: DC | PRN
  Filled 2019-08-21: qty 2

## 2019-08-21 MED ORDER — ROSUVASTATIN CALCIUM 5 MG PO TABS
5.0000 mg | ORAL_TABLET | Freq: Every day | ORAL | Status: DC
  Administered 2019-08-22: 5 mg via ORAL
  Filled 2019-08-21 (×2): qty 1

## 2019-08-21 MED ORDER — PROPOFOL 500 MG/50ML IV EMUL
INTRAVENOUS | Status: DC | PRN
  Administered 2019-08-21: 75 ug/kg/min via INTRAVENOUS

## 2019-08-21 MED ORDER — ALUM & MAG HYDROXIDE-SIMETH 200-200-20 MG/5ML PO SUSP: 30.0000 mL | ORAL | Status: DC | PRN

## 2019-08-21 MED ORDER — METHOCARBAMOL 500 MG PO TABS
ORAL_TABLET | ORAL | Status: AC
  Filled 2019-08-21: qty 1

## 2019-08-21 MED ORDER — VITAMIN D 25 MCG (1000 UNIT) PO TABS
1000.0000 [IU] | ORAL_TABLET | Freq: Every day | ORAL | Status: DC
  Administered 2019-08-22 – 2019-08-24 (×3): 1000 [IU] via ORAL
  Filled 2019-08-21 (×3): qty 1

## 2019-08-21 MED ORDER — TRANEXAMIC ACID-NACL 1000-0.7 MG/100ML-% IV SOLN
INTRAVENOUS | Status: AC
  Filled 2019-08-21: qty 100

## 2019-08-21 MED ORDER — LEVOTHYROXINE SODIUM 25 MCG PO TABS
25.0000 ug | ORAL_TABLET | Freq: Every day | ORAL | Status: DC
  Administered 2019-08-22 – 2019-08-24 (×3): 25 ug via ORAL
  Filled 2019-08-21 (×3): qty 1

## 2019-08-21 MED ORDER — OXYCODONE HCL 5 MG PO TABS
5.0000 mg | ORAL_TABLET | Freq: Once | ORAL | Status: AC | PRN
  Administered 2019-08-21: 5 mg via ORAL

## 2019-08-21 MED ORDER — POVIDONE-IODINE 10 % EX SWAB
2.0000 "application " | Freq: Once | CUTANEOUS | Status: AC
  Administered 2019-08-21: 2 via TOPICAL

## 2019-08-21 MED ORDER — METHOCARBAMOL 500 MG PO TABS
500.0000 mg | ORAL_TABLET | Freq: Four times a day (QID) | ORAL | Status: DC | PRN
  Administered 2019-08-21 – 2019-08-24 (×7): 500 mg via ORAL
  Filled 2019-08-21 (×6): qty 1

## 2019-08-21 MED ORDER — METOCLOPRAMIDE HCL 5 MG PO TABS: 5.0000 mg | ORAL_TABLET | Freq: Three times a day (TID) | ORAL | Status: DC | PRN

## 2019-08-21 MED ORDER — FENTANYL CITRATE (PF) 100 MCG/2ML IJ SOLN
INTRAMUSCULAR | Status: AC
  Filled 2019-08-21: qty 2

## 2019-08-21 MED ORDER — EPHEDRINE SULFATE-NACL 50-0.9 MG/10ML-% IV SOSY
PREFILLED_SYRINGE | INTRAVENOUS | Status: DC | PRN
  Administered 2019-08-21: 5 mg via INTRAVENOUS
  Administered 2019-08-21: 10 mg via INTRAVENOUS
  Administered 2019-08-21: 5 mg via INTRAVENOUS

## 2019-08-21 MED ORDER — OXYCODONE HCL 5 MG PO TABS
5.0000 mg | ORAL_TABLET | ORAL | Status: DC | PRN
  Administered 2019-08-21 – 2019-08-23 (×4): 10 mg via ORAL
  Administered 2019-08-24: 5 mg via ORAL
  Filled 2019-08-21 (×3): qty 2
  Filled 2019-08-21: qty 1
  Filled 2019-08-21 (×4): qty 2

## 2019-08-21 MED ORDER — POLYETHYLENE GLYCOL 3350 17 G PO PACK: 17.0000 g | PACK | Freq: Every day | ORAL | Status: DC | PRN

## 2019-08-21 MED ORDER — MIDAZOLAM HCL 2 MG/2ML IJ SOLN
1.0000 mg | Freq: Once | INTRAMUSCULAR | Status: AC
  Administered 2019-08-21: 12:00:00 1 mg via INTRAVENOUS

## 2019-08-21 MED ORDER — ACETAMINOPHEN 325 MG PO TABS
325.0000 mg | ORAL_TABLET | Freq: Four times a day (QID) | ORAL | Status: DC | PRN
  Administered 2019-08-23 – 2019-08-24 (×3): 650 mg via ORAL
  Filled 2019-08-21 (×3): qty 2

## 2019-08-21 MED ORDER — GABAPENTIN 300 MG PO CAPS
300.0000 mg | ORAL_CAPSULE | Freq: Two times a day (BID) | ORAL | Status: DC
  Administered 2019-08-21 – 2019-08-24 (×6): 300 mg via ORAL
  Filled 2019-08-21 (×6): qty 1

## 2019-08-21 MED ORDER — HYDROMORPHONE HCL 1 MG/ML IJ SOLN
INTRAMUSCULAR | Status: AC
  Filled 2019-08-21: qty 1

## 2019-08-21 MED ORDER — ACETAMINOPHEN 500 MG PO TABS
ORAL_TABLET | ORAL | Status: AC
  Filled 2019-08-21: qty 2

## 2019-08-21 MED ORDER — ONDANSETRON HCL 4 MG/2ML IJ SOLN
INTRAMUSCULAR | Status: DC | PRN
  Administered 2019-08-21: 4 mg via INTRAVENOUS

## 2019-08-21 MED ORDER — ALBUTEROL SULFATE HFA 108 (90 BASE) MCG/ACT IN AERS: 2.0000 | INHALATION_SPRAY | RESPIRATORY_TRACT | Status: DC | PRN

## 2019-08-21 MED ORDER — FENTANYL CITRATE (PF) 100 MCG/2ML IJ SOLN
INTRAMUSCULAR | Status: AC
  Administered 2019-08-21: 50 ug via INTRAVENOUS
  Filled 2019-08-21: qty 2

## 2019-08-21 MED ORDER — CHLORHEXIDINE GLUCONATE 4 % EX LIQD
60.0000 mL | Freq: Once | CUTANEOUS | Status: AC
  Administered 2019-08-21: 4 via TOPICAL

## 2019-08-21 MED ORDER — BUPIVACAINE-EPINEPHRINE (PF) 0.5% -1:200000 IJ SOLN
INTRAMUSCULAR | Status: DC | PRN
  Administered 2019-08-21: 15 mL via PERINEURAL

## 2019-08-21 MED ORDER — PROMETHAZINE HCL 25 MG/ML IJ SOLN: 6.2500 mg | INTRAMUSCULAR | Status: DC | PRN

## 2019-08-21 MED ORDER — CELECOXIB 200 MG PO CAPS: 200.0000 mg | ORAL_CAPSULE | Freq: Two times a day (BID) | ORAL | Status: DC | PRN

## 2019-08-21 MED ORDER — AMLODIPINE BESYLATE 2.5 MG PO TABS
2.5000 mg | ORAL_TABLET | Freq: Every day | ORAL | Status: DC
  Administered 2019-08-22 – 2019-08-23 (×2): 2.5 mg via ORAL
  Filled 2019-08-21 (×3): qty 1

## 2019-08-21 MED ORDER — HYDROMORPHONE HCL 1 MG/ML IJ SOLN
0.5000 mg | INTRAMUSCULAR | Status: DC | PRN
  Administered 2019-08-21: 0.5 mg via INTRAVENOUS
  Administered 2019-08-21: 1 mg via INTRAVENOUS
  Filled 2019-08-21: qty 1

## 2019-08-21 MED ORDER — LOSARTAN POTASSIUM 50 MG PO TABS
100.0000 mg | ORAL_TABLET | Freq: Every day | ORAL | Status: DC
  Administered 2019-08-22 – 2019-08-23 (×2): 100 mg via ORAL
  Filled 2019-08-21 (×3): qty 2

## 2019-08-21 MED ORDER — EPHEDRINE 5 MG/ML INJ
INTRAVENOUS | Status: AC
  Filled 2019-08-21: qty 10

## 2019-08-21 MED ORDER — ONDANSETRON HCL 4 MG PO TABS: 4.0000 mg | ORAL_TABLET | Freq: Four times a day (QID) | ORAL | Status: DC | PRN

## 2019-08-21 MED ORDER — BUPIVACAINE IN DEXTROSE 0.75-8.25 % IT SOLN
INTRATHECAL | Status: DC | PRN
  Administered 2019-08-21: 1.6 mL via INTRATHECAL

## 2019-08-21 MED ORDER — MIDAZOLAM HCL 2 MG/2ML IJ SOLN
INTRAMUSCULAR | Status: AC
  Filled 2019-08-21: qty 2

## 2019-08-21 MED ORDER — ASPIRIN 81 MG PO CHEW
81.0000 mg | CHEWABLE_TABLET | Freq: Two times a day (BID) | ORAL | Status: DC
  Administered 2019-08-21 – 2019-08-24 (×6): 81 mg via ORAL
  Filled 2019-08-21 (×6): qty 1

## 2019-08-21 MED ORDER — SODIUM CHLORIDE 0.9 % IV SOLN
INTRAVENOUS | Status: DC
  Administered 2019-08-21 – 2019-08-22 (×2): via INTRAVENOUS

## 2019-08-21 MED ORDER — FENTANYL CITRATE (PF) 100 MCG/2ML IJ SOLN
25.0000 ug | INTRAMUSCULAR | Status: DC | PRN
  Administered 2019-08-21 (×4): 25 ug via INTRAVENOUS

## 2019-08-21 MED ORDER — METOCLOPRAMIDE HCL 5 MG/ML IJ SOLN: 5.0000 mg | Freq: Three times a day (TID) | INTRAMUSCULAR | Status: DC | PRN

## 2019-08-21 MED ORDER — SODIUM CHLORIDE 0.9 % IR SOLN
Status: DC | PRN
  Administered 2019-08-21: 3000 mL

## 2019-08-21 MED ORDER — FENTANYL CITRATE (PF) 100 MCG/2ML IJ SOLN
50.0000 ug | Freq: Once | INTRAMUSCULAR | Status: AC
  Administered 2019-08-21: 12:00:00 50 ug via INTRAVENOUS

## 2019-08-21 MED ORDER — LACTATED RINGERS IV SOLN
INTRAVENOUS | Status: DC
  Administered 2019-08-21: 12:00:00 via INTRAVENOUS

## 2019-08-21 MED ORDER — MENTHOL 3 MG MT LOZG: 1.0000 | LOZENGE | OROMUCOSAL | Status: DC | PRN

## 2019-08-21 MED ORDER — ONDANSETRON HCL 4 MG/2ML IJ SOLN
INTRAMUSCULAR | Status: AC
  Filled 2019-08-21: qty 2

## 2019-08-21 MED ORDER — ALBUTEROL SULFATE (2.5 MG/3ML) 0.083% IN NEBU: 2.5000 mg | INHALATION_SOLUTION | RESPIRATORY_TRACT | Status: DC | PRN

## 2019-08-21 MED ORDER — CLINDAMYCIN PHOSPHATE 600 MG/50ML IV SOLN
600.0000 mg | Freq: Four times a day (QID) | INTRAVENOUS | Status: AC
  Administered 2019-08-21 – 2019-08-22 (×2): 600 mg via INTRAVENOUS
  Filled 2019-08-21 (×2): qty 50

## 2019-08-21 MED ORDER — OXYCODONE HCL 5 MG PO TABS
10.0000 mg | ORAL_TABLET | ORAL | Status: DC | PRN
  Administered 2019-08-22 – 2019-08-23 (×5): 10 mg via ORAL
  Filled 2019-08-21 (×2): qty 2

## 2019-08-21 MED ORDER — DIPHENHYDRAMINE HCL 12.5 MG/5ML PO ELIX: 12.5000 mg | ORAL_SOLUTION | ORAL | Status: DC | PRN

## 2019-08-21 MED ORDER — PHENOL 1.4 % MT LIQD: 1.0000 | OROMUCOSAL | Status: DC | PRN

## 2019-08-21 SURGICAL SUPPLY — 69 items
BANDAGE ESMARK 6X9 LF (GAUZE/BANDAGES/DRESSINGS) ×1 IMPLANT
BASEPLATE TIBIAL NO 3 (Orthopedic Implant) ×3 IMPLANT
BENZOIN TINCTURE PRP APPL 2/3 (GAUZE/BANDAGES/DRESSINGS) ×3 IMPLANT
BLADE SAG 18X100X1.27 (BLADE) ×3 IMPLANT
BNDG ELASTIC 6X5.8 VLCR STR LF (GAUZE/BANDAGES/DRESSINGS) ×3 IMPLANT
BNDG ESMARK 6X9 LF (GAUZE/BANDAGES/DRESSINGS) ×3
BOWL SMART MIX CTS (DISPOSABLE) ×3 IMPLANT
CEMENT BONE SIMPLEX SPEEDSET (Cement) ×6 IMPLANT
CLOSURE STERI-STRIP 1/2X4 (GAUZE/BANDAGES/DRESSINGS) ×1
CLOSURE WOUND 1/2 X4 (GAUZE/BANDAGES/DRESSINGS)
CLSR STERI-STRIP ANTIMIC 1/2X4 (GAUZE/BANDAGES/DRESSINGS) ×2 IMPLANT
COVER SURGICAL LIGHT HANDLE (MISCELLANEOUS) ×3 IMPLANT
COVER WAND RF STERILE (DRAPES) ×3 IMPLANT
CUFF TOURN SGL QUICK 34 (TOURNIQUET CUFF) ×2
CUFF TRNQT CYL 34X4.125X (TOURNIQUET CUFF) ×1 IMPLANT
DRAPE EXTREMITY T 121X128X90 (DISPOSABLE) ×3 IMPLANT
DRAPE HALF SHEET 40X57 (DRAPES) ×3 IMPLANT
DRAPE INCISE IOBAN 66X45 STRL (DRAPES) ×3 IMPLANT
DRAPE U-SHAPE 47X51 STRL (DRAPES) ×3 IMPLANT
DRSG PAD ABDOMINAL 8X10 ST (GAUZE/BANDAGES/DRESSINGS) ×6 IMPLANT
DURAPREP 26ML APPLICATOR (WOUND CARE) ×3 IMPLANT
ELECT CAUTERY BLADE 6.4 (BLADE) ×3 IMPLANT
ELECT REM PT RETURN 9FT ADLT (ELECTROSURGICAL) ×3
ELECTRODE REM PT RTRN 9FT ADLT (ELECTROSURGICAL) ×1 IMPLANT
FACESHIELD WRAPAROUND (MASK) ×6 IMPLANT
FEMORAL PEG DISTAL FIXATION (Orthopedic Implant) ×3 IMPLANT
FEMORAL POST STABILIZED NO 3 (Orthopedic Implant) ×3 IMPLANT
GAUZE SPONGE 4X4 12PLY STRL (GAUZE/BANDAGES/DRESSINGS) ×3 IMPLANT
GAUZE XEROFORM 1X8 LF (GAUZE/BANDAGES/DRESSINGS) ×3 IMPLANT
GLOVE BIOGEL PI IND STRL 8 (GLOVE) ×2 IMPLANT
GLOVE BIOGEL PI INDICATOR 8 (GLOVE) ×4
GLOVE ORTHO TXT STRL SZ7.5 (GLOVE) ×3 IMPLANT
GLOVE SURG ORTHO 8.0 STRL STRW (GLOVE) ×3 IMPLANT
GOWN STRL REUS W/ TWL LRG LVL3 (GOWN DISPOSABLE) ×2 IMPLANT
GOWN STRL REUS W/ TWL XL LVL3 (GOWN DISPOSABLE) ×2 IMPLANT
GOWN STRL REUS W/TWL LRG LVL3 (GOWN DISPOSABLE) ×4
GOWN STRL REUS W/TWL XL LVL3 (GOWN DISPOSABLE) ×4
HANDPIECE INTERPULSE COAX TIP (DISPOSABLE) ×2
IMMOBILIZER KNEE 22 UNIV (SOFTGOODS) ×3 IMPLANT
INSERT PS TRIATH X3 #3 10 (Insert) ×3 IMPLANT
KIT BASIN OR (CUSTOM PROCEDURE TRAY) ×3 IMPLANT
KIT TURNOVER KIT B (KITS) ×3 IMPLANT
MANIFOLD NEPTUNE II (INSTRUMENTS) ×3 IMPLANT
NEEDLE 18GX1X1/2 (RX/OR ONLY) (NEEDLE) IMPLANT
NS IRRIG 1000ML POUR BTL (IV SOLUTION) ×3 IMPLANT
PACK TOTAL JOINT (CUSTOM PROCEDURE TRAY) ×3 IMPLANT
PAD ARMBOARD 7.5X6 YLW CONV (MISCELLANEOUS) ×3 IMPLANT
PADDING CAST COTTON 6X4 STRL (CAST SUPPLIES) ×3 IMPLANT
PATELLA TRIATHLON SZ 29 9 MM (Orthopedic Implant) ×3 IMPLANT
PIN FLUTED HEDLESS FIX 3.5X1/8 (PIN) ×3 IMPLANT
SET HNDPC FAN SPRY TIP SCT (DISPOSABLE) ×1 IMPLANT
SET PAD KNEE POSITIONER (MISCELLANEOUS) ×3 IMPLANT
STAPLER VISISTAT 35W (STAPLE) IMPLANT
STRIP CLOSURE SKIN 1/2X4 (GAUZE/BANDAGES/DRESSINGS) IMPLANT
SUCTION FRAZIER HANDLE 10FR (MISCELLANEOUS) ×2
SUCTION TUBE FRAZIER 10FR DISP (MISCELLANEOUS) ×1 IMPLANT
SUT ETHILON 2 0 PSLX (SUTURE) ×6 IMPLANT
SUT MNCRL AB 4-0 PS2 18 (SUTURE) IMPLANT
SUT VIC AB 0 CT1 27 (SUTURE) ×2
SUT VIC AB 0 CT1 27XBRD ANBCTR (SUTURE) ×1 IMPLANT
SUT VIC AB 1 CT1 27 (SUTURE) ×4
SUT VIC AB 1 CT1 27XBRD ANBCTR (SUTURE) ×2 IMPLANT
SUT VIC AB 2-0 CT1 27 (SUTURE) ×4
SUT VIC AB 2-0 CT1 TAPERPNT 27 (SUTURE) ×2 IMPLANT
SYR 50ML LL SCALE MARK (SYRINGE) ×3 IMPLANT
TOWEL GREEN STERILE (TOWEL DISPOSABLE) ×3 IMPLANT
TOWEL GREEN STERILE FF (TOWEL DISPOSABLE) ×3 IMPLANT
TRAY CATH 16FR W/PLASTIC CATH (SET/KITS/TRAYS/PACK) ×3 IMPLANT
WRAP KNEE MAXI GEL POST OP (GAUZE/BANDAGES/DRESSINGS) ×3 IMPLANT

## 2019-08-21 NOTE — Progress Notes (Signed)
Orthopedic Tech Progress Note Patient Details:  Joann Ray 1946/05/25 [id number]  CPM Right Knee CPM Right Knee: On Right Knee Flexion (Degrees): 0 Right Knee Extension (Degrees): 60  Post Interventions Patient Tolerated: Well Instructions Provided: [facility] [place] 08/21/2019, 5:12 PM

## 2019-08-21 NOTE — H&P (Signed)
TOTAL KNEE ADMISSION H&P  Patient is being admitted for right total knee arthroplasty.  Subjective:  Chief Complaint:right knee pain.  HPI: Joann Ray, 73 y.o. female, has a history of pain and functional disability in the right knee due to arthritis and has failed non-surgical conservative treatments for greater than 12 weeks to includeNSAID's and/or analgesics, corticosteriod injections, viscosupplementation injections, flexibility and strengthening excercises, use of assistive devices and activity modification.  Onset of symptoms was gradual, starting 3 years ago with gradually worsening course since that time. The patient noted no past surgery on the right knee(s).  Patient currently rates pain in the right knee(s) at 10 out of 10 with activity. Patient has night pain, worsening of pain with activity and weight bearing, pain that interferes with activities of daily living, pain with passive range of motion, crepitus and joint swelling.  Patient has evidence of subchondral cysts, subchondral sclerosis, periarticular osteophytes and joint space narrowing by imaging studies. There is no active infection.  Patient Active Problem List   Diagnosis Date Noted  . Adhesive capsulitis of right shoulder 09/08/2018  . Paroxysmal SVT (supraventricular tachycardia) ([place]) 09/08/2018  . Osteoarthritis of right knee 05/27/2016  . COPD (chronic obstructive pulmonary disease) ([place]) 03/02/2016  . Essential familial hypercholesterolemia 03/02/2016  . IBS (irritable bowel syndrome) 03/02/2016  . Migraine headache 03/02/2016  . Chronic fatigue syndrome 03/02/2016  . Vitamin D deficiency 01/15/2015  . Chronic back pain 02/13/2014  . PVD (peripheral vascular disease) ([place]) 08/16/2012  . Menopause syndrome 02/28/2012  . Chronic pain 07/27/2011  . Essential hypertension 12/07/2010  . Insomnia 09/07/2010  . Hypothyroidism 07/06/2010  . GAD (generalized anxiety disorder) 07/06/2010  . Fibromyalgia  07/06/2010  . Osteoporosis 07/06/2010   Past Medical History:  Diagnosis Date  . Anemia   . Anxiety   . Arrhythmia     AVNRT  . Asthma   . COPD (chronic obstructive pulmonary disease) ([place])   . Depression   . Fibromyalgia   . Hyperlipidemia   . Hypertension   . Hypothyroidism   . Migraine   . PONV (postoperative nausea and vomiting)     Past Surgical History:  Procedure Laterality Date  . ABDOMINAL HYSTERECTOMY    . APPENDECTOMY    . CARDIAC ELECTROPHYSIOLOGY MAPPING AND ABLATION  08/28/2010   EPS/RFA of AVNRT  - Dr. Crissie Sickles  . CORONARY STENT PLACEMENT  09/2012  . IR GENERIC HISTORICAL  07/29/2016   IR RADIOLOGY PERIPHERAL GUIDED IV START 07/29/2016 Corrie Mckusick, DO [facility]INTER[facility]  . IR GENERIC HISTORICAL  07/29/2016   IR US GUIDE VASC ACCESS RIGHT 07/29/2016 Corrie Mckusick, DO [facility]  . NASAL SINUS SURGERY  1993  . PERCUTANEOUS STENT INTERVENTION Left 10/18/2012   Procedure: PERCUTANEOUS STENT INTERVENTION;  Surgeon: Sherren Mocha, MD;  Location: [facility] CATH LAB;  Service: Cardiovascular;  Laterality: Left;  . UNILATERAL UPPER EXTREMEITY ANGIOGRAM Left 10/18/2012   Procedure: UNILATERAL UPPER Anselmo Rod;  Surgeon: Sherren Mocha, MD;  Location: [facility] CATH LAB;  Service: Cardiovascular;  Laterality: Left;    Current Facility-Administered Medications  Medication Dose Route Frequency Provider Last Rate Last Dose  . acetaminophen (TYLENOL) 500 MG tablet           . clindamycin (CLEOCIN) IVPB 900 mg  900 mg Intravenous To SS-Surg Mcarthur Rossetti, MD      . fentaNYL (SUBLIMAZE) 100 MCG/2ML injection           . lactated ringers infusion   Intravenous Continuous Doloris Hall  E, MD 10 mL/hr at 08/21/19 1142    . midazolam (VERSED) 2 MG/2ML injection           . tranexamic acid (CYKLOKAPRON) IVPB 1,000 mg  1,000 mg Intravenous To OR Mcarthur Rossetti, MD       Allergies  Allergen Reactions  . Penicillins Anaphylaxis, Hives and Swelling    Did it involve  swelling of the face/tongue/throat, SOB, or low BP? Yes Did it involve sudden or severe rash/hives, skin peeling, or any reaction on the inside of your mouth or nose? No Did you need to seek medical attention at a hospital or doctor's office? Yes When did it last happen?50 years ago If all above answers are "NO", may proceed with cephalosporin use.  . Diazepam Palpitations    Social History   Tobacco Use  . Smoking status: Former Smoker    Packs/day: 0.50    Years: 40.00    Pack years: 20.00    Types: Cigarettes    Quit date: 2018    Years since quitting: 2.7  . Smokeless tobacco: Never Used  . Tobacco comment: September 2011, again Oct 2013  Substance Use Topics  . Alcohol use: No    Family History  Problem Relation Age of Onset  . Colon cancer Sister 36  . Diabetes Mother   . Stroke Mother   . Dementia Mother   . Skin cancer Father   . Stroke Sister   . Breast cancer Paternal Aunt   . Stomach cancer Paternal Aunt      Review of Systems  Musculoskeletal: Positive for joint pain.    Objective:  Physical Exam  Constitutional: She is oriented to person, place, and time. She appears well-developed and well-nourished.  HENT:  Head: Normocephalic and atraumatic.  Eyes: Pupils are equal, round, and reactive to light. EOM are normal.  Neck: Normal range of motion. Neck supple.  Cardiovascular: Normal rate.  Respiratory: Effort normal.  GI: Soft.  Musculoskeletal:     Right knee: She exhibits decreased range of motion, effusion, abnormal alignment, bony tenderness and abnormal meniscus. Tenderness found. Medial joint line and lateral joint line tenderness noted.  Neurological: She is alert and oriented to person, place, and time.  Skin: Skin is warm and dry.  Psychiatric: She has a normal mood and affect.    Vital signs in last 24 hours: Temp:  [97.6 F (36.4 C)] 97.6 F (36.4 C) (09/22 0951) Pulse Rate:  [69] 69 (09/22 0951) Resp:  [18] 18 (09/22 0951) BP:  (143)/(58) 143/58 (09/22 0951) SpO2:  [100 %] 100 % (09/22 0951) Weight:  [63.2 kg] 63.2 kg (09/22 1021)  Labs:   Estimated body mass index is 25.48 kg/m as calculated from the following:   Height as of this encounter: 5\' 2"  (1.575 m).   Weight as of this encounter: 63.2 kg.   Imaging Review Plain radiographs demonstrate severe degenerative joint disease of the right knee(s). The overall alignment ismild varus. The bone quality appears to be good for age and reported activity level.      Assessment/Plan:  End stage arthritis, right knee   The patient history, physical examination, clinical judgment of the provider and imaging studies are consistent with end stage degenerative joint disease of the right knee(s) and total knee arthroplasty is deemed medically necessary. The treatment options including medical management, injection therapy arthroscopy and arthroplasty were discussed at length. The risks and benefits of total knee arthroplasty were presented and reviewed. The risks due  to aseptic loosening, infection, stiffness, patella tracking problems, thromboembolic complications and other imponderables were discussed. The patient acknowledged the explanation, agreed to proceed with the plan and consent was signed. Patient is being admitted for inpatient treatment for surgery, pain control, PT, OT, prophylactic antibiotics, VTE prophylaxis, progressive ambulation and ADL's and discharge planning. The patient is planning to be discharged home with home health services

## 2019-08-21 NOTE — Brief Op Note (Signed)
08/21/2019  1:34 PM  PATIENT:  Ross Marcus  73 y.o. female  PRE-OPERATIVE DIAGNOSIS:  osteoarthritis right knee  POST-OPERATIVE DIAGNOSIS:  osteoarthritis right knee  PROCEDURE:  Procedure(s): RIGHT TOTAL KNEE ARTHROPLASTY (Right)  SURGEON:  Surgeon(s) and Role:    Mcarthur Rossetti, MD - Primary  PHYSICIAN ASSISTANT:  Benita Stabile, PA-C  ANESTHESIA:   regional and spinal  COUNTS:  YES  TOURNIQUET:   Total Tourniquet Time Documented: Thigh (Right) - 48 minutes Total: Thigh (Right) - 48 minutes   DICTATION: .Other Dictation: Dictation Number [id number]  PLAN OF CARE: Admit to inpatient   PATIENT DISPOSITION:  PACU - hemodynamically stable.   Delay start of Pharmacological VTE agent (>24hrs) due to surgical blood loss or risk of bleeding: no

## 2019-08-21 NOTE — Anesthesia Procedure Notes (Signed)
Anesthesia Regional Block: Adductor canal block   Pre-Anesthetic Checklist: ,, timeout performed, Correct Patient, Correct Site, Correct Laterality, Correct Procedure, Correct Position, site marked, Risks and benefits discussed, pre-op evaluation,  At surgeon's request and post-op pain management  Laterality: Right  Prep: Maximum Sterile Barrier Precautions used, chloraprep       Needles:  Injection technique: Single-shot  Needle Type: Echogenic Stimulator Needle     Needle Length: 9cm  Needle Gauge: 22     Additional Needles:   Procedures:,,,, ultrasound used (permanent image in chart),,,,  Narrative:  Start time: 08/21/2019 11:50 AM End time: 08/21/2019 11:52 AM Injection made incrementally with aspirations every 5 mL.  Performed by: Personally  Anesthesiologist: Brennan Bailey, MD  Additional Notes: Risks, benefits, and alternative discussed. Patient gave consent for procedure. Patient prepped and draped in sterile fashion. Sedation administered, patient remains easily responsive to voice. Relevant anatomy identified with ultrasound guidance. Local anesthetic given in 5cc increments with no signs or symptoms of intravascular injection. No pain or paraesthesias with injection. Patient monitored throughout procedure with signs of LAST or immediate complications. Tolerated well. Ultrasound image placed in chart.  Tawny Asal, MD

## 2019-08-21 NOTE — Evaluation (Signed)
Physical Therapy Evaluation Patient Details Name: Joann Ray MRN: [id number] DOB: Mar 08, 1946 Today's Date: 08/21/2019   History of Present Illness  Pt is a 73 y/o female s/p R TKA. PMH includes COPD, HTN, fibromyalgia and osteoporosis.   Clinical Impression  Pt s/p surgery above with deficits below. Pt with increased pain and only able to tolerate side stepping at EOB using RW. Required min A for mobility tasks. Reviewed knee precautions with pt. Will continue to follow acutely to maximize functional mobility independence and safety.     Follow Up Recommendations Follow surgeon's recommendation for DC plan and follow-up therapies;Supervision for mobility/OOB    Equipment Recommendations  Rolling walker with 5" wheels;3in1 (PT)    Recommendations for Other Services       Precautions / Restrictions Precautions Precautions: Knee Precaution Booklet Issued: No Precaution Comments: Verbally reviewed knee precautions with pt.  Restrictions Weight Bearing Restrictions: Yes RLE Weight Bearing: Weight bearing as tolerated      Mobility  Bed Mobility Overal bed mobility: Needs Assistance Bed Mobility: Supine to Sit;Sit to Supine     Supine to sit: Min assist Sit to supine: Min assist   General bed mobility comments: Min A for RLE assist throughout bed mobility tasks.   Transfers Overall transfer level: Needs assistance Equipment used: Rolling walker (2 wheeled) Transfers: Sit to/from Stand Sit to Stand: Min assist         General transfer comment: Min A for lift assist and steadying. Cues for safe hand placement.   Ambulation/Gait Ambulation/Gait assistance: Min assist   Assistive device: Rolling walker (2 wheeled)       General Gait Details: took side steps at EOB with min A. Pt with increased pain with side stepping, so further mobility deferred.   Stairs            Wheelchair Mobility    Modified Rankin (Stroke Patients Only)       Balance  Overall balance assessment: Needs assistance Sitting-balance support: No upper extremity supported;Feet supported Sitting balance-Leahy Scale: Good     Standing balance support: Bilateral upper extremity supported;During functional activity Standing balance-Leahy Scale: Poor Standing balance comment: Reliant on BUE support                              Pertinent Vitals/Pain Pain Assessment: 0-10 Pain Score: 8  Pain Location: R knee Pain Descriptors / Indicators: Aching;Operative site guarding Pain Intervention(s): Limited activity within patient's tolerance;Monitored during session;Repositioned;RN gave pain meds during session    Home Living Family/patient expects to be discharged to:: Private residence Living Arrangements: Children Available Help at Discharge: Family;Available 24 hours/day Type of Home: House Home Access: Stairs to enter Entrance Stairs-Rails: Right Entrance Stairs-Number of Steps: 4-5 Home Layout: Two level;Able to live on main level with bedroom/bathroom Home Equipment: None      Prior Function Level of Independence: Independent               Hand Dominance        Extremity/Trunk Assessment   Upper Extremity Assessment Upper Extremity Assessment: Overall WFL for tasks assessed    Lower Extremity Assessment Lower Extremity Assessment: RLE deficits/detail RLE Deficits / Details: Deficits consistent with post op pain and weakness.     Cervical / Trunk Assessment Cervical / Trunk Assessment: Normal  Communication   Communication: No difficulties  Cognition Arousal/Alertness: Awake/alert Behavior During Therapy: WFL for tasks assessed/performed Overall Cognitive Status: Within Functional Limits  for tasks assessed                                        General Comments General comments (skin integrity, edema, etc.): Pt's son present throughout session.     Exercises     Assessment/Plan    PT Assessment  Patient needs continued PT services  PT Problem List Decreased strength;Decreased range of motion;Decreased activity tolerance;Decreased mobility;Decreased balance;Decreased knowledge of use of DME;Decreased knowledge of precautions;Pain       PT Treatment Interventions Gait training;DME instruction;Functional mobility training;Stair training;Therapeutic activities;Therapeutic exercise;Balance training;Patient/family education    PT Goals (Current goals can be found in the Care Plan section)  Acute Rehab PT Goals Patient Stated Goal: to decrease pain PT Goal Formulation: With patient Time For Goal Achievement: 09/04/19 Potential to Achieve Goals: Good    Frequency 7X/week   Barriers to discharge        Co-evaluation               AM-PAC PT "6 Clicks" Mobility  Outcome Measure Help needed turning from your back to your side while in a flat bed without using bedrails?: A Little Help needed moving from lying on your back to sitting on the side of a flat bed without using bedrails?: A Little Help needed moving to and from a bed to a chair (including a wheelchair)?: A Little Help needed standing up from a chair using your arms (e.g., wheelchair or bedside chair)?: A Little Help needed to walk in hospital room?: A Little Help needed climbing 3-5 steps with a railing? : A Lot 6 Click Score: 17    End of Session Equipment Utilized During Treatment: Gait belt Activity Tolerance: Patient limited by pain Patient left: in bed;with call bell/phone within reach;with family/visitor present Nurse Communication: Mobility status PT Visit Diagnosis: Difficulty in walking, not elsewhere classified (R26.2);Pain Pain - Right/Left: Right Pain - part of body: Knee    Time: 1741-1805 PT Time Calculation (min) (ACUTE ONLY): 24 min   Charges:   PT Evaluation $PT Eval Low Complexity: 1 Low PT Treatments $Therapeutic Activity: 8-22 mins        Leighton Ruff, PT, DPT  Acute  Rehabilitation Services  Pager: [phone] Office: [phone]   Rudean Hitt 08/21/2019, 6:12 PM

## 2019-08-21 NOTE — Anesthesia Procedure Notes (Signed)
Spinal  Patient location during procedure: OR Start time: 08/21/2019 12:16 PM End time: 08/21/2019 12:17 PM Staffing Anesthesiologist: Brennan Bailey, MD Performed: anesthesiologist  Preanesthetic Checklist Completed: patient identified, surgical consent, pre-op evaluation, timeout performed, IV checked, risks and benefits discussed and monitors and equipment checked Spinal Block Patient position: sitting Prep: site prepped and draped and DuraPrep Patient monitoring: continuous pulse ox, blood pressure and heart rate Approach: midline Location: L3-4 Injection technique: single-shot Needle Needle type: Whitacre  Needle gauge: 22 G Needle length: 9 cm Additional Notes Risks, benefits, and alternative discussed. Patient gave consent to procedure. Prepped and draped in sitting position. Patient sedated but responsive to voice. Clear CSF obtained after one needle pass. Positive terminal aspiration. No pain or paraesthesias with injection. Patient tolerated procedure well. Vital signs stable. Tawny Asal, MD

## 2019-08-21 NOTE — Transfer of Care (Signed)
Immediate Anesthesia Transfer of Care Note  Patient: Joann Ray  Procedure(s) Performed: RIGHT TOTAL KNEE ARTHROPLASTY (Right Knee)  Patient Location: PACU  Anesthesia Type:MAC combined with regional for post-op pain  Level of Consciousness: awake and patient cooperative  Airway & Oxygen Therapy: Patient Spontanous Breathing and Patient connected to nasal cannula oxygen  Post-op Assessment: Report given to RN, Post -op Vital signs reviewed and stable and Patient moving all extremities  Post vital signs: Reviewed and stable  Last Vitals:  Vitals Value Taken Time  BP 115/54 08/21/19 1415  Temp    Pulse 57 08/21/19 1416  Resp 11 08/21/19 1416  SpO2 100 % 08/21/19 1416  Vitals shown include unvalidated device data.  Last Pain:  Vitals:   08/21/19 1021  TempSrc:   PainSc: 4       Patients Stated Pain Goal: 2 (53/96/72 8979)  Complications: No apparent anesthesia complications

## 2019-08-21 NOTE — Op Note (Signed)
NAME: Joann Ray, Joann Ray MEDICAL RECORD [id number] ACCOUNT [id number] DATE OF BIRTH:09/06/1946 FACILITY: [facility] LOCATION: [facility] PHYSICIAN:Cellie Dardis Kerry Fort, MD  OPERATIVE REPORT  DATE OF PROCEDURE:  08/21/2019  PREOPERATIVE DIAGNOSIS:  Primary osteoarthritis and degenerative joint disease, right knee.  POSTOPERATIVE DIAGNOSIS:  Primary osteoarthritis and degenerative joint disease, right knee.  PROCEDURE:  Right total knee arthroplasty.  IMPLANTS:  Stryker Triathlon cemented knee system with size 3 femur, size 3 tibial tray, 10 mm fixed-bearing polyethylene insert, size 29 patellar button.  SURGEON:  Lind Guest. Ninfa Linden, MD  ASSISTANT:  Erskine Emery, PA-C  ANESTHESIA: 1.  Right lower extremity adductor canal block. 2.  Spinal.  ANTIBIOTICS:  Two grams IV Ancef.  TOURNIQUET TIME:  Under 1 hour.  ESTIMATED BLOOD LOSS:  Less than 100 mL.  COMPLICATIONS:  None.  INDICATIONS:  The patient is a 73 year old active female with severe end-stage arthritis involving her right knee.  She has tried and failed all forms of conservative treatment.  Her right knee shows complete loss of the medial joint space with varus  malalignment and bone-on-bone wear.  At this point, her pain is daily and is detrimentally affecting her mobility, her quality of life, and activities of daily living to the point she does wish to proceed with total knee arthroplasty.  We had a long and  thorough discussion about this type of surgery.  We talked in detail about the risk of acute blood loss anemia, nerve and vessel injury, fracture, infection, DVT, and implant failure.  We talked about the goals being decreased pain, improved mobility,  and overall improved quality of life.  DESCRIPTION OF PROCEDURE:  After informed consent was obtained and appropriate right knee was marked, she was brought to the operating room after an adductor canal block was obtained of her right lower extremity in the  holding room.  She was sat up on  the operating table.  Spinal anesthesia was then obtained.  She was then laid in supine position on the operating table.  Foley catheter was placed, and a nonsterile tourniquet was placed around her upper right thigh.  Her right thigh, knee, leg, ankle,  and foot were prepped and draped with DuraPrep and sterile drapes.  A time-out was called, and she was identified as correct patient and correct right knee.  We then used an Esmarch to wrap that leg, and tourniquet was inflated to 250 mm of pressure.  We  then made a midline incision over the knee at the level of the patella and carried this proximally and distally.  We dissected down to the knee joint and carried out a medial parapatellar arthrotomy.  We found a large joint effusion and significant  osteoarthritis throughout her right knee.  We removed remnants of ACL, PCL, medial and lateral meniscus.  With the knee in a flexed position, we used our extramedullary cutting guide for taking 9 mm off the high side of the proximal tibia.  We corrected  for varus and valgus and neutral slope.  We made this cut without difficulty.  We then used an intramedullary drill through the notch of the femur to set her intramedullary cutting guide for 8 mm distal femoral cut, setting this for right knee at 5  degrees externally rotated.  We made that cut without difficulty.  We brought the knee back down to full extension with a 9 mm extension block.  We achieved full extension.  We then went back to the femur and put our femoral sizing guide  based off the  epicondylar axis and Whitesides line.  Based off this, we chose a size 3 femur.  We put a 4-in-1 cutting block for a size 3 femur, made our anterior and posterior cuts, followed by our chamfer cuts.  We then made our femoral box cut.  With the knee in 90  degrees, we did a test again with our flexion block.  At 90 degrees, we felt like we had balanced our gaps.  We then went back to  the tibia and chose a size 3 tibial tray for coverage.  We set the rotation off the tibial tubercle and the femur, and we  did our keel punch off of this.  With the size 3 trial tibia followed by the size 3 trial right femur, we trialed a 9 mm fixed-bearing polyethylene insert.  We were pleased with stability.  We felt like we needed just maybe 1 more millimeter of  thickness.  We then made our patellar cut and drilled 3 holes for a size 29 patellar button.  We put all the trial instrumentation in the knee and put the knee through range of motion.  We were pleased with range of motion and stability.  We then removed  all trial instrumentation and irrigated the knee with normal saline solution using pulsatile lavage.  We dried the knee real well and mixed our cement.  We then cemented the Stryker Triathlon tibial tray size 3, followed by cementing the size 3 right  femur.  We placed our 10 mm fixed-bearing polyethylene insert and cemented our size 29 patellar button.  We then held the knee fully extended while the cement cured.  Once the cement hardened, we let the tourniquet down.  Hemostasis was obtained with  electrocautery.  We removed excess debris from the knee.  We then closed our arthrotomy with interrupted #1 Vicryl suture followed by closing the deep tissue with 0 Vicryl, subcutaneous tissue was closed with 2-0 Vicryl suture, and a 4-0 Monocryl  subcuticular stitch was applied.  A well-padded sterile dressing was applied.  She was taken to recovery room in stable condition.  All final counts were correct.  There were no complications noted.  Of note, Benita Stabile, PA-C, assisted the entire case.   His assistance was crucial for facilitating all aspects of this case.  LN/NUANCE  D:08/21/2019 T:08/21/2019 JOB:[id number]

## 2019-08-21 NOTE — Progress Notes (Signed)
1710 Received pt from PACU, A&O x4. RLE with ace wrap dry and intact. CPM on at 0-60 degrees. Pt is c/o right knee pain, medicated.

## 2019-08-21 NOTE — Anesthesia Postprocedure Evaluation (Signed)
Anesthesia Post Note  Patient: Joann Ray  Procedure(s) Performed: RIGHT TOTAL KNEE ARTHROPLASTY (Right Knee)     Patient location during evaluation: PACU Anesthesia Type: Spinal Level of consciousness: awake and alert and oriented Pain management: pain level controlled Vital Signs Assessment: post-procedure vital signs reviewed and stable Respiratory status: spontaneous breathing, nonlabored ventilation and respiratory function stable Cardiovascular status: blood pressure returned to baseline Postop Assessment: no apparent nausea or vomiting and spinal receding Anesthetic complications: no    Last Vitals:  Vitals:   08/21/19 1600 08/21/19 1615  BP: (!) 123/59 (!) 127/56  Pulse: (!) 55 (!) 53  Resp: (!) 9 11  Temp:    SpO2: 100% 99%    Last Pain:  Vitals:   08/21/19 1545  TempSrc:   PainSc: [place]

## 2019-08-22 ENCOUNTER — Encounter (HOSPITAL_COMMUNITY): Payer: Self-pay | Admitting: Orthopaedic Surgery

## 2019-08-22 DIAGNOSIS — F411 Generalized anxiety disorder: Secondary | ICD-10-CM | POA: Diagnosis present

## 2019-08-22 DIAGNOSIS — E1151 Type 2 diabetes mellitus with diabetic peripheral angiopathy without gangrene: Secondary | ICD-10-CM | POA: Diagnosis present

## 2019-08-22 DIAGNOSIS — M797 Fibromyalgia: Secondary | ICD-10-CM | POA: Diagnosis present

## 2019-08-22 DIAGNOSIS — E785 Hyperlipidemia, unspecified: Secondary | ICD-10-CM | POA: Diagnosis present

## 2019-08-22 DIAGNOSIS — I10 Essential (primary) hypertension: Secondary | ICD-10-CM | POA: Diagnosis present

## 2019-08-22 DIAGNOSIS — Z833 Family history of diabetes mellitus: Secondary | ICD-10-CM | POA: Diagnosis not present

## 2019-08-22 DIAGNOSIS — E039 Hypothyroidism, unspecified: Secondary | ICD-10-CM | POA: Diagnosis present

## 2019-08-22 DIAGNOSIS — M25561 Pain in right knee: Secondary | ICD-10-CM | POA: Diagnosis present

## 2019-08-22 DIAGNOSIS — G8929 Other chronic pain: Secondary | ICD-10-CM | POA: Diagnosis present

## 2019-08-22 DIAGNOSIS — Z90710 Acquired absence of both cervix and uterus: Secondary | ICD-10-CM | POA: Diagnosis not present

## 2019-08-22 DIAGNOSIS — J449 Chronic obstructive pulmonary disease, unspecified: Secondary | ICD-10-CM | POA: Diagnosis present

## 2019-08-22 DIAGNOSIS — M1711 Unilateral primary osteoarthritis, right knee: Secondary | ICD-10-CM | POA: Diagnosis present

## 2019-08-22 DIAGNOSIS — Z955 Presence of coronary angioplasty implant and graft: Secondary | ICD-10-CM | POA: Diagnosis not present

## 2019-08-22 DIAGNOSIS — Z87891 Personal history of nicotine dependence: Secondary | ICD-10-CM | POA: Diagnosis not present

## 2019-08-22 DIAGNOSIS — D62 Acute posthemorrhagic anemia: Secondary | ICD-10-CM | POA: Diagnosis not present

## 2019-08-22 LAB — CBC
HCT: 31.4 % — ABNORMAL LOW (ref 36.0–46.0)
Hemoglobin: 9.8 g/dL — ABNORMAL LOW (ref 12.0–15.0)
MCH: 31.4 pg (ref 26.0–34.0)
MCHC: 31.2 g/dL (ref 30.0–36.0)
MCV: 100.6 fL — ABNORMAL HIGH (ref 80.0–100.0)
Platelets: 203 10*3/uL (ref 150–400)
RBC: 3.12 MIL/uL — ABNORMAL LOW (ref 3.87–5.11)
RDW: 15.3 % (ref 11.5–15.5)
WBC: 10.7 10*3/uL — ABNORMAL HIGH (ref 4.0–10.5)
nRBC: 0 % (ref 0.0–0.2)

## 2019-08-22 LAB — GLUCOSE, CAPILLARY
Glucose-Capillary: 119 mg/dL — ABNORMAL HIGH (ref 70–99)
Glucose-Capillary: 127 mg/dL — ABNORMAL HIGH (ref 70–99)
Glucose-Capillary: 112 mg/dL — ABNORMAL HIGH (ref 70–99)
Glucose-Capillary: 117 mg/dL — ABNORMAL HIGH (ref 70–99)

## 2019-08-22 LAB — BASIC METABOLIC PANEL
Anion gap: 8 (ref 5–15)
BUN: 17 mg/dL (ref 8–23)
CO2: 24 mmol/L (ref 22–32)
Calcium: 7.8 mg/dL — ABNORMAL LOW (ref 8.9–10.3)
Chloride: 100 mmol/L (ref 98–111)
Creatinine, Ser: 1.1 mg/dL — ABNORMAL HIGH (ref 0.44–1.00)
GFR calc Af Amer: 58 mL/min — ABNORMAL LOW (ref >60)
GFR calc non Af Amer: 50 mL/min — ABNORMAL LOW (ref >60)
Glucose, Bld: 143 mg/dL — ABNORMAL HIGH (ref 70–99)
Potassium: 4.4 mmol/L (ref 3.5–5.1)
Sodium: 132 mmol/L — ABNORMAL LOW (ref 135–145)

## 2019-08-22 MED ORDER — HYDROMORPHONE HCL 1 MG/ML IJ SOLN
0.5000 mg | INTRAMUSCULAR | Status: DC | PRN
  Administered 2019-08-22 – 2019-08-23 (×2): 1 mg via INTRAVENOUS
  Filled 2019-08-22 (×2): qty 1

## 2019-08-22 MED ORDER — KETOROLAC TROMETHAMINE 15 MG/ML IJ SOLN
7.5000 mg | Freq: Four times a day (QID) | INTRAMUSCULAR | Status: AC
  Administered 2019-08-22 – 2019-08-24 (×8): 7.5 mg via INTRAVENOUS
  Filled 2019-08-22 (×8): qty 1

## 2019-08-22 NOTE — Progress Notes (Signed)
Physical Therapy Treatment Patient Details Name: Joann Ray MRN: [id number] DOB: [birth year] Today's Date: 08/22/2019    History of Present Illness Pt is a 73 y/o female s/p R TKA. PMH includes COPD, HTN, fibromyalgia and osteoporosis.     PT Comments    Pt with slow progression towards goals. Remains limited by pain and only tolerated short gait distance within the room. Pt with shakiness and required min A for steadying assist during mobility tasks using RW. Current recommendations appropriate. Will continue to follow acutely to maximize functional mobility independence and safety.     Follow Up Recommendations  Follow surgeon's recommendation for DC plan and follow-up therapies;Supervision for mobility/OOB     Equipment Recommendations  Rolling walker with 5" wheels;3in1 (PT)    Recommendations for Other Services       Precautions / Restrictions Precautions Precautions: Knee Precaution Booklet Issued: No Restrictions Weight Bearing Restrictions: Yes RLE Weight Bearing: Weight bearing as tolerated    Mobility  Bed Mobility Overal bed mobility: Needs Assistance Bed Mobility: Supine to Sit     Supine to sit: Min assist     General bed mobility comments: Min A for trunk elevation and RLE assist. Increased time required to come to EOB.   Transfers Overall transfer level: Needs assistance Equipment used: Rolling walker (2 wheeled) Transfers: Sit to/from Stand Sit to Stand: Min assist         General transfer comment: Min A for lift assist and steadying. Cues for safe hand placement. Pt kicking out RLE secondary to pain.   Ambulation/Gait Ambulation/Gait assistance: Min assist Gait Distance (Feet): 15 Feet Assistive device: Rolling walker (2 wheeled) Gait Pattern/deviations: Step-to pattern;Decreased step length - right;Decreased step length - left;Decreased weight shift to right;Antalgic Gait velocity: Decreased   General Gait Details: Very slow,  antalgic gait. Required cues for sequencing using RW and required cues for proximity to device. Gait distance limited to bathroom and back to recliner secondary to pain.    Stairs             Wheelchair Mobility    Modified Rankin (Stroke Patients Only)       Balance Overall balance assessment: Needs assistance Sitting-balance support: No upper extremity supported;Feet supported Sitting balance-Leahy Scale: Good     Standing balance support: Bilateral upper extremity supported;During functional activity Standing balance-Leahy Scale: Poor Standing balance comment: Reliant on BUE support                             Cognition Arousal/Alertness: Awake/alert Behavior During Therapy: WFL for tasks assessed/performed Overall Cognitive Status: Within Functional Limits for tasks assessed                                        Exercises Total Joint Exercises Ankle Circles/Pumps: AROM;Both;20 reps    General Comments General comments (skin integrity, edema, etc.): Pt's son present during session.       Pertinent Vitals/Pain Pain Assessment: Faces Faces Pain Scale: Hurts whole lot Pain Location: R knee Pain Descriptors / Indicators: Aching;Operative site guarding Pain Intervention(s): Limited activity within patient's tolerance;Monitored during session;Repositioned;Premedicated before session    Home Living                      Prior Function            PT  Goals (current goals can now be found in the care plan section) Acute Rehab PT Goals Patient Stated Goal: to decrease pain PT Goal Formulation: With patient Time For Goal Achievement: 09/04/19 Potential to Achieve Goals: Good Progress towards PT goals: Progressing toward goals    Frequency    7X/week      PT Plan Current plan remains appropriate    Co-evaluation              AM-PAC PT "6 Clicks" Mobility   Outcome Measure  Help needed turning from your  back to your side while in a flat bed without using bedrails?: A Little Help needed moving from lying on your back to sitting on the side of a flat bed without using bedrails?: A Little Help needed moving to and from a bed to a chair (including a wheelchair)?: A Little Help needed standing up from a chair using your arms (e.g., wheelchair or bedside chair)?: A Little Help needed to walk in hospital room?: A Little Help needed climbing 3-5 steps with a railing? : A Lot 6 Click Score: 17    End of Session Equipment Utilized During Treatment: Gait belt Activity Tolerance: Patient limited by pain Patient left: in chair;with call bell/phone within reach;with chair alarm set Nurse Communication: Mobility status PT Visit Diagnosis: Difficulty in walking, not elsewhere classified (R26.2);Pain Pain - Right/Left: Right Pain - part of body: Knee     Time: 1136-1203 PT Time Calculation (min) (ACUTE ONLY): 27 min  Charges:  $Gait Training: 8-22 mins $Therapeutic Activity: 8-22 mins                     Leighton Ruff, PT, DPT  Acute Rehabilitation Services  Pager: [phone] Office: [phone]    Rudean Hitt 08/22/2019, 12:11 PM

## 2019-08-22 NOTE — Progress Notes (Signed)
   08/22/19 1552  PT Visit Information  Last PT Received On 08/22/19  Assistance Needed +1  History of Present Illness Pt is a 73 y/o female s/p R TKA. PMH includes COPD, HTN, fibromyalgia and osteoporosis.   Subjective Data  Patient Stated Goal to decrease pain  Precautions  Precautions Knee  Precaution Booklet Issued Yes (comment)  Precaution Comments Reviewed supine HEP  Restrictions  Weight Bearing Restrictions Yes  RLE Weight Bearing WBAT  Pain Assessment  Pain Assessment Faces  Faces Pain Scale 8  Pain Location R knee  Pain Descriptors / Indicators Aching;Operative site guarding  Pain Intervention(s) Limited activity within patient's tolerance;Monitored during session;Repositioned;RN gave pain meds during session  Cognition  Arousal/Alertness Awake/alert  Behavior During Therapy [facility] for tasks assessed/performed  Overall Cognitive Status Within Functional Limits for tasks assessed  Bed Mobility  General bed mobility comments Pt just returned to bed and in increased pain, so requesting to defer OOB mobility. Was agreeable to performing HEP.   Exercises  Exercises Total Joint  Total Joint Exercises  Ankle Circles/Pumps AROM;Both;10 reps;Supine  Quad Sets AROM;Right;10 reps;Supine  Towel Squeeze AROM;Both;10 reps;Supine  Heel Slides AAROM;10 reps;Right;Supine;Limitations  Hip ABduction/ADduction AAROM;Right;10 reps;Supine  Heel Slides Limitations Limited ROM secondary to increased pain   PT - End of Session  Activity Tolerance Patient limited by pain  Patient left in bed;with call bell/phone within reach  Nurse Communication Mobility status  CPM Right Knee  CPM Right Knee Off   PT - Assessment/Plan  PT Plan Current plan remains appropriate  PT Visit Diagnosis Difficulty in walking, not elsewhere classified (R26.2);Pain  Pain - Right/Left Right  Pain - part of body Knee  PT Frequency (ACUTE ONLY) 7X/week  Follow Up Recommendations Follow surgeon's recommendation for DC  plan and follow-up therapies;Supervision for mobility/OOB  PT equipment Rolling walker with 5" wheels;3in1 (PT)  AM-PAC PT "6 Clicks" Mobility Outcome Measure (Version 2)  Help needed turning from your back to your side while in a flat bed without using bedrails? 3  Help needed moving from lying on your back to sitting on the side of a flat bed without using bedrails? 3  Help needed moving to and from a bed to a chair (including a wheelchair)? 3  Help needed standing up from a chair using your arms (e.g., wheelchair or bedside chair)? 3  Help needed to walk in hospital room? 3  Help needed climbing 3-5 steps with a railing?  2  6 Click Score 17  Consider Recommendation of Discharge To: Home with [facility]  PT Goal Progression  Progress towards PT goals Not progressing toward goals - comment (2/2 pain )  Acute Rehab PT Goals  PT Goal Formulation With patient  Time For Goal Achievement 09/04/19  Potential to Achieve Goals Good  PT Time Calculation  PT Start Time (ACUTE ONLY) 1506  PT Stop Time (ACUTE ONLY) 1523  PT Time Calculation (min) (ACUTE ONLY) 17 min  PT General Charges  $$ ACUTE PT VISIT 1 Visit  PT Treatments  $Therapeutic Exercise 8-22 mins   Pt continues to be very limited secondary to pain. Pt just getting back to bed upon entry and asked to defer OOB mobility secondary to pain. Performed RLE HEP in supine. Pt with notable limitations in R knee ROM secondary to pain. Will continue to follow acutely to maximize functional mobility independence and safety.   Leighton Ruff, PT, DPT  Acute Rehabilitation Services  Pager: [phone] Office: [phone]

## 2019-08-22 NOTE — Progress Notes (Signed)
PT Cancellation Note  Patient Details Name: TABATHIA MEDDINGS MRN: [id number] DOB: Dec 13, 1945   Cancelled Treatment:    Reason Eval/Treat Not Completed: Pain limiting ability to participate Pt requesting to hold on PT until she receives pain medications. Will follow up as schedule allows.   Leighton Ruff, PT, DPT  Acute Rehabilitation Services  Pager: [phone] Office: [phone]  Rudean Hitt 08/22/2019, 10:44 AM

## 2019-08-22 NOTE — Progress Notes (Signed)
Patient ID: Joann Ray, female   DOB: 10-May-1946, 73 y.o.   MRN: [id number] The patient needs to be reclassified as an inpatient admission due to her lack of progress with physical therapy and pain control issues.  It is not safe for her medically or mobility wise to be discharged to home today.

## 2019-08-22 NOTE — Plan of Care (Signed)
  Problem: Education: Goal: Knowledge of General Education information will improve Description Including pain rating scale, medication(s)/side effects and non-pharmacologic comfort measures Outcome: Progressing   

## 2019-08-22 NOTE — Progress Notes (Signed)
Subjective: 1 Day Post-Op Procedure(s) (LRB): RIGHT TOTAL KNEE ARTHROPLASTY (Right) Patient reports pain as moderate.  Reports nausea this am.  Acute blood loss anemia from surgery, but vitals stable.  Objective: Vital signs in last 24 hours: Temp:  [97.6 F (36.4 C)-97.9 F (36.6 C)] 97.6 F (36.4 C) (09/22 2010) Pulse Rate:  [53-111] 61 (09/22 2010) Resp:  [9-21] 18 (09/22 2010) BP: (102-143)/(48-76) 117/48 (09/22 2010) SpO2:  [98 %-100 %] 100 % (09/22 2010) Weight:  [63.2 kg] 63.2 kg (09/22 1021)  Intake/Output from previous day: 09/22 0701 - 09/23 0700 In: 1782.3 [P.O.:240; I.V.:1492.3; IV Piggyback:50] Out: 1000 [Urine:950; Blood:50] Intake/Output this shift: No intake/output data recorded.  Recent Labs    08/22/19 0544  HGB 9.8*   Recent Labs    08/22/19 0544  WBC 10.7*  RBC 3.12*  HCT 31.4*  PLT 203   Recent Labs    08/22/19 0544  NA 132*  K 4.4  CL 100  CO2 24  BUN 17  CREATININE 1.10*  GLUCOSE 143*  CALCIUM 7.8*   No results for input(s): LABPT, INR in the last 72 hours.  Sensation intact distally Intact pulses distally Dorsiflexion/Plantar flexion intact Incision: dressing C/D/I Compartment soft   Assessment/Plan: 1 Day Post-Op Procedure(s) (LRB): RIGHT TOTAL KNEE ARTHROPLASTY (Right) Up with therapy Can not make a decision about discharge yet given her pain, nausea, acute blood loss anemia and due to the fact that she has not been up with therapy yet. Can not go home today.   Patient's anticipated LOS is less than 2 midnights, meeting these requirements: - Younger than 69 - Lives within 1 hour of care - Has a competent adult at home to recover with post-op recover - NO history of  - Chronic pain requiring opiods  - Diabetes  - Coronary Artery Disease  - Heart failure  - Heart attack  - Stroke  - DVT/VTE  - Cardiac arrhythmia  - Respiratory Failure/COPD  - Renal failure  - Anemia  - Advanced Liver  disease       Mcarthur Rossetti 08/22/2019, 7:27 AM

## 2019-08-22 NOTE — Progress Notes (Signed)
Orthopedic Tech Progress Note Patient Details:  Joann Ray 1946/02/14 [id number]  Patient ID: Joann Ray, female   DOB: 13-Oct-1946, 73 y.o.   MRN: [id number] Pt in to much pain to get in cpm  Joann Ray 08/22/2019, 5:32 AM

## 2019-08-23 ENCOUNTER — Encounter (HOSPITAL_COMMUNITY): Payer: Self-pay | Admitting: *Deleted

## 2019-08-23 LAB — ABO/RH: ABO/RH(D): O POS

## 2019-08-23 LAB — CBC
HCT: 22.8 % — ABNORMAL LOW (ref 36.0–46.0)
Hemoglobin: 7.3 g/dL — ABNORMAL LOW (ref 12.0–15.0)
MCH: 31.7 pg (ref 26.0–34.0)
MCHC: 32 g/dL (ref 30.0–36.0)
MCV: 99.1 fL (ref 80.0–100.0)
Platelets: 161 10*3/uL (ref 150–400)
RBC: 2.3 MIL/uL — ABNORMAL LOW (ref 3.87–5.11)
RDW: 15.3 % (ref 11.5–15.5)
WBC: 7.6 10*3/uL (ref 4.0–10.5)
nRBC: 0 % (ref 0.0–0.2)

## 2019-08-23 LAB — PREPARE RBC (CROSSMATCH)

## 2019-08-23 LAB — GLUCOSE, CAPILLARY
Glucose-Capillary: 82 mg/dL (ref 70–99)
Glucose-Capillary: 84 mg/dL (ref 70–99)
Glucose-Capillary: 109 mg/dL — ABNORMAL HIGH (ref 70–99)
Glucose-Capillary: 146 mg/dL — ABNORMAL HIGH (ref 70–99)

## 2019-08-23 LAB — HEMOGLOBIN AND HEMATOCRIT, BLOOD
HCT: 29.1 % — ABNORMAL LOW (ref 36.0–46.0)
Hemoglobin: 9.5 g/dL — ABNORMAL LOW (ref 12.0–15.0)

## 2019-08-23 MED ORDER — SODIUM CHLORIDE 0.9% IV SOLUTION
Freq: Once | INTRAVENOUS | Status: AC
  Administered 2019-08-23: 10:00:00 via INTRAVENOUS

## 2019-08-23 MED ORDER — ASPIRIN 81 MG PO CHEW: 81.0000 mg | CHEWABLE_TABLET | Freq: Two times a day (BID) | ORAL | 0 refills | Status: DC

## 2019-08-23 MED ORDER — FUROSEMIDE 10 MG/ML IJ SOLN
20.0000 mg | Freq: Once | INTRAMUSCULAR | Status: AC
  Administered 2019-08-23: 12:00:00 20 mg via INTRAVENOUS
  Filled 2019-08-23: qty 2

## 2019-08-23 MED ORDER — METHOCARBAMOL 500 MG PO TABS: 500.0000 mg | ORAL_TABLET | Freq: Four times a day (QID) | ORAL | 1 refills | Status: DC | PRN

## 2019-08-23 MED ORDER — OXYCODONE HCL 5 MG PO TABS: 5.0000 mg | ORAL_TABLET | ORAL | 0 refills | Status: DC | PRN

## 2019-08-23 NOTE — Plan of Care (Signed)
  Problem: Education: Goal: Knowledge of General Education information will improve Description: Including pain rating scale, medication(s)/side effects and non-pharmacologic comfort measures Outcome: Progressing   Problem: Health Behavior/Discharge Planning: Goal: Ability to manage health-related needs will improve Outcome: Progressing   Problem: Clinical Measurements: Goal: Ability to maintain clinical measurements within normal limits will improve Outcome: Progressing Goal: Will remain free from infection Outcome: Progressing Goal: Diagnostic test results will improve Outcome: Progressing Goal: Respiratory complications will improve Outcome: Progressing Goal: Cardiovascular complication will be avoided Outcome: Progressing   Problem: Activity: Goal: Risk for activity intolerance will decrease Outcome: Progressing   Problem: Elimination: Goal: Will not experience complications related to bowel motility Outcome: Progressing Goal: Will not experience complications related to urinary retention Outcome: Progressing   Problem: Pain Managment: Goal: General experience of comfort will improve Outcome: Progressing   Problem: Safety: Goal: Ability to remain free from injury will improve Outcome: Progressing   

## 2019-08-23 NOTE — Progress Notes (Signed)
CRITICAL VALUE STICKER  CRITICAL VALUE: drop in hemoglobin to 7.3 from 9.8  RECEIVER (on-site recipient of call): Thamas Jaegers, RN  [place] NOTIFIED: Not notified by lab, noted in results review   MESSENGER (representative from lab):  MD NOTIFIED: Dr. Sharol Given notified   TIME OF NOTIFICATION: [phone]  RESPONSE: No transfusion at this time, to monitor the patient

## 2019-08-23 NOTE — Progress Notes (Signed)
Physical Therapy Treatment Patient Details Name: Joann Ray MRN: [id number] DOB: [birth year] Today's Date: 08/23/2019    History of Present Illness Pt is a 73 y/o female s/p R TKA. PMH includes COPD, HTN, fibromyalgia and osteoporosis.     PT Comments    Pt was attempted 3 x today without a visit and was seen a fourth time for actual therapy. Her issue was being seen by OT, then a meal and then transfusion; after that PT saw her for there ex during transfusion and after was too fatigued for a walk with stairs.  Follow up tomorrow for her progression of gait, and will monitor for her hgb since pregait was 7.3 today before transfusions.   Follow Up Recommendations  Follow surgeon's recommendation for DC plan and follow-up therapies;Supervision for mobility/OOB     Equipment Recommendations  Rolling walker with 5" wheels;3in1 (PT)    Recommendations for Other Services       Precautions / Restrictions Precautions Precautions: Knee Precaution Booklet Issued: Yes (comment) Precaution Comments: Reviewed supine HEP Restrictions Weight Bearing Restrictions: Yes RLE Weight Bearing: Weight bearing as tolerated    Mobility  Bed Mobility               General bed mobility comments: did not get OOB  Transfers                    Ambulation/Gait                 Stairs             Wheelchair Mobility    Modified Rankin (Stroke Patients Only)       Balance                                            Cognition Arousal/Alertness: Awake/alert Behavior During Therapy: WFL for tasks assessed/performed Overall Cognitive Status: Within Functional Limits for tasks assessed                                        Exercises Total Joint Exercises Ankle Circles/Pumps: AROM;Both;10 reps;Supine Quad Sets: AROM;Right;10 reps;Supine Heel Slides: AAROM;10 reps;Right;Supine;Limitations Heel Slides Limitations: R knee  flexion limited by pain and stiffness Hip ABduction/ADduction: AAROM;Right;10 reps;Supine    General Comments        Pertinent Vitals/Pain Pain Assessment: 0-10 Pain Score: 5  Pain Location: R knee Pain Descriptors / Indicators: Aching;Operative site guarding Pain Intervention(s): Limited activity within patient's tolerance;Monitored during session;Premedicated before session;Repositioned    Home Living                      Prior Function            PT Goals (current goals can now be found in the care plan section) Acute Rehab PT Goals Patient Stated Goal: go home Progress towards PT goals: Progressing toward goals    Frequency    7X/week      PT Plan Current plan remains appropriate    Co-evaluation              AM-PAC PT "6 Clicks" Mobility   Outcome Measure  Help needed turning from your back to your side while in a flat bed without using bedrails?: A Little Help needed  moving from lying on your back to sitting on the side of a flat bed without using bedrails?: A Little Help needed moving to and from a bed to a chair (including a wheelchair)?: A Little Help needed standing up from a chair using your arms (e.g., wheelchair or bedside chair)?: A Little Help needed to walk in hospital room?: A Little Help needed climbing 3-5 steps with a railing? : A Lot 6 Click Score: 17    End of Session   Activity Tolerance: Patient limited by pain Patient left: in bed;with call bell/phone within reach Nurse Communication: Mobility status PT Visit Diagnosis: Difficulty in walking, not elsewhere classified (R26.2);Pain Pain - Right/Left: Right Pain - part of body: Knee     Time: [id number] PT Time Calculation (min) (ACUTE ONLY): 19 min  Charges:  $Therapeutic Exercise: 8-22 mins                   Ramond Dial 08/23/2019, 5:17 PM   Mee Hives, PT MS Acute Rehab Dept. Number: [place] and [place]

## 2019-08-23 NOTE — Progress Notes (Signed)
Received call from blood bank. They can only authorize one unit of blood given the pts Hgb is 7.3. Called MD. MD aware and ok with transfusing one unit today. Plan to draw Hgb in AM and transfuse again if needed - per MD. Pt noted to be tired. Sluggish, but stated that she still wanted to do therapy and get out of bed.

## 2019-08-23 NOTE — Plan of Care (Signed)
Pt. Was seen for skilled OT to maximize I and safety with ADLs and was ed on use of AE. Pt. Was able to return demo at Mod A level. Pt. Needs further ed on AE and DME for ADLs. Pt. Would benefit from continued OT at hospital and in [facility] setting.

## 2019-08-23 NOTE — Progress Notes (Signed)
Subjective: 2 Days Post-Op Procedure(s) (LRB): RIGHT TOTAL KNEE ARTHROPLASTY (Right) Patient reports pain as mild.  Denies chest pain, SOB,nausea or vomiting.   Objective: Vital signs in last 24 hours: Temp:  [98.2 F (36.8 C)-98.5 F (36.9 C)] 98.2 F (36.8 C) (09/24 0724) Pulse Rate:  [67-76] 67 (09/24 0724) Resp:  [14-19] 14 (09/24 0724) BP: (93-127)/(48-55) 93/49 (09/24 0724) SpO2:  [96 %-100 %] 100 % (09/24 0724)  Intake/Output from previous day: 09/23 0701 - 09/24 0700 In: 460 [P.O.:460] Out: -  Intake/Output this shift: Total I/O In: 120 [P.O.:120] Out: -   Recent Labs    08/22/19 0544 08/23/19 0311  HGB 9.8* 7.3*   Recent Labs    08/22/19 0544 08/23/19 0311  WBC 10.7* 7.6  RBC 3.12* 2.30*  HCT 31.4* 22.8*  PLT 203 161   Recent Labs    08/22/19 0544  NA 132*  K 4.4  CL 100  CO2 24  BUN 17  CREATININE 1.10*  GLUCOSE 143*  CALCIUM 7.8*   No results for input(s): LABPT, INR in the last 72 hours.  Right knee: Dorsiflexion/Plantar flexion intact Incision: moderate drainage Compartment soft   Assessment/Plan: 2 Days Post-Op Procedure(s) (LRB): RIGHT TOTAL KNEE ARTHROPLASTY (Right) Acute on chronic anemia will transfuse PRBC today  Up with therapy after transfusion Probable discharge home tomorrow.        GILBERT CLARK 08/23/2019, 10:32 AM

## 2019-08-23 NOTE — Plan of Care (Signed)

## 2019-08-23 NOTE — Discharge Instructions (Signed)

## 2019-08-23 NOTE — Evaluation (Signed)
Occupational Therapy Evaluation Patient Details Name: Joann Ray MRN: [id number] DOB: 05-09-1946 Today's Date: 08/23/2019    History of Present Illness Pt is a 73 y/o female s/p R TKA. PMH includes COPD, HTN, fibromyalgia and osteoporosis.    Clinical Impression    Pt. Was seen for skilled OT to maximize I and safety with ADLs and was ed on use of AE. Pt. Was able to return demo at Mod A level. Pt. Needs further ed on AE and DME for ADLs. Pt. Would benefit from continued OT at hospital and in [facility] setting.   Follow Up Recommendations  Home health OT    Equipment Recommendations       Recommendations for Other Services       Precautions / Restrictions Precautions Precautions: Knee Restrictions RLE Weight Bearing: Weight bearing as tolerated      Mobility Bed Mobility                  Transfers       Sit to Stand: Min guard              Balance                                           ADL either performed or assessed with clinical judgement   ADL Overall ADL's : Needs assistance/impaired Eating/Feeding: Independent   Grooming: Wash/dry hands;Wash/dry face;Set up   Upper Body Bathing: Set up   Lower Body Bathing: Minimal assistance   Upper Body Dressing : Set up   Lower Body Dressing: Moderate assistance;With adaptive equipment   Toilet Transfer: Min guard   Toileting- Water quality scientist and Hygiene: Min guard       Functional mobility during ADLs: Min guard General ADL Comments: Patient ed on use of AE for LE ADLs.      Vision Baseline Vision/History: Wears glasses Wears Glasses: At all times Patient Visual Report: No change from baseline       Perception     Praxis      Pertinent Vitals/Pain Pain Assessment: 0-10 Pain Score: 4  Pain Location: R knee Pain Descriptors / Indicators: Aching Pain Intervention(s): Limited activity within patient's tolerance     Hand Dominance     Extremity/Trunk  Assessment Upper Extremity Assessment Upper Extremity Assessment: Generalized weakness           Communication Communication Communication: No difficulties   Cognition Arousal/Alertness: Awake/alert Behavior During Therapy: WFL for tasks assessed/performed Overall Cognitive Status: Within Functional Limits for tasks assessed                                     General Comments       Exercises     Shoulder Instructions      Home Living Family/patient expects to be discharged to:: Private residence Living Arrangements: Children Available Help at Discharge: Family;Available 24 hours/day Type of Home: House Home Access: Stairs to enter [employer] of Steps: 4-5 Entrance Stairs-Rails: Right Home Layout: Two level;Able to live on main level with bedroom/bathroom     Bathroom Shower/Tub: Occupational psychologist: Standard     Home Equipment: None          Prior Functioning/Environment Level of Independence: Independent  OT Problem List: Decreased activity tolerance;Decreased knowledge of use of DME or AE      OT Treatment/Interventions: Self-care/ADL training;DME and/or AE instruction;Therapeutic activities;Patient/family education    OT Goals(Current goals can be found in the care plan section) Acute Rehab OT Goals Patient Stated Goal: go home OT Goal Formulation: With patient Time For Goal Achievement: 09/06/19 Potential to Achieve Goals: Good  OT Frequency: Min 2X/week   Barriers to D/C: (Pt. husband has health issues but son is going to stay [facility])          Co-evaluation              AM-PAC OT "6 Clicks" Daily Activity     Outcome Measure Help from another person eating meals?: None Help from another person taking care of personal grooming?: A Little Help from another person toileting, which includes using toliet, bedpan, or urinal?: A Little Help from another person bathing (including  washing, rinsing, drying)?: A Little Help from another person to put on and taking off regular upper body clothing?: A Little Help from another person to put on and taking off regular lower body clothing?: A Lot 6 Click Score: 18   End of Session Equipment Utilized During Treatment: Gait belt;Rolling walker Nurse Communication: (ok therapy)  Activity Tolerance: Patient tolerated treatment well Patient left: in chair;with call bell/phone within reach  OT Visit Diagnosis: Unsteadiness on feet (R26.81)                Time: [id number] OT Time Calculation (min): 30 min Charges:  OT General Charges $OT Visit: 1 Visit OT Evaluation $OT Eval Low Complexity: 1 Low OT Treatments $Self Care/Home Management : [id number] mins  6 clicks  Dequante Tremaine 08/23/2019, 12:51 PM

## 2019-08-24 LAB — BPAM RBC
Blood Product Expiration Date: 202010102359
ISSUE DATE / TIME: 202009241136
Unit Type and Rh: 5100

## 2019-08-24 LAB — TYPE AND SCREEN
ABO/RH(D): O POS
Antibody Screen: NEGATIVE
Unit division: 0

## 2019-08-24 LAB — GLUCOSE, CAPILLARY: Glucose-Capillary: 86 mg/dL (ref 70–99)

## 2019-08-24 NOTE — Progress Notes (Signed)
Patient ID: Joann Ray, female   DOB: [birth year], 73 y.o.   MRN: [id number] No acute changes hgb up to 9.5 from transfusion.  Vitals stable.  Right knee stable.  Can be discharged to home today.

## 2019-08-24 NOTE — Discharge Summary (Signed)
Patient ID: Joann Ray MRN: [id number] DOB/AGE: [birth year] 73 y.o.  Admit date: 08/21/2019 Discharge date: 08/24/2019  Admission Diagnoses:  Principal Problem:   Osteoarthritis of right knee Active Problems:   Status post total right knee replacement   Discharge Diagnoses:  Same  Past Medical History:  Diagnosis Date  . Anemia   . Anxiety   . Arrhythmia     AVNRT  . Asthma   . COPD (chronic obstructive pulmonary disease) ([place])   . Depression   . Family history of adverse reaction to anesthesia    " My yougest daughter gets real sick "  . Fibromyalgia   . Hyperlipidemia   . Hypertension   . Hypothyroidism   . Migraine   . PONV (postoperative nausea and vomiting)     Surgeries: Procedure(s): RIGHT TOTAL KNEE ARTHROPLASTY on 08/21/2019   Consultants:   Discharged Condition: Improved  Hospital Course: Joann Ray is an 73 y.o. female who was admitted 08/21/2019 for operative treatment ofOsteoarthritis of right knee. Patient has severe unremitting pain that affects sleep, daily activities, and work/hobbies. After pre-op clearance the patient was taken to the operating room on 08/21/2019 and underwent  Procedure(s): RIGHT TOTAL KNEE ARTHROPLASTY.    Patient was given perioperative antibiotics:  Anti-infectives (From admission, onward)   Start     Dose/Rate Route Frequency Ordered Stop   08/21/19 1800  clindamycin (CLEOCIN) IVPB 600 mg     600 mg 100 mL/hr over 30 Minutes Intravenous Every 6 hours 08/21/19 1707 08/22/19 0239   08/21/19 1100  clindamycin (CLEOCIN) IVPB 900 mg     900 mg 100 mL/hr over 30 Minutes Intravenous To [facility] 08/20/19 1435 08/21/19 1215       Patient was given sequential compression devices, early ambulation, and chemoprophylaxis to prevent DVT.  Patient benefited maximally from hospital stay and there were no complications.  She did require a transfusion of one unit of blood due to acute blood loss anemia and  did well with this.  Recent vital signs:  Patient Vitals for the past 24 hrs:  BP Temp Temp src Pulse Resp SpO2  08/24/19 0428 (!) 108/59 - - 60 17 96 %  08/23/19 1929 (!) 85/45 98.3 F (36.8 C) Oral (!) 53 17 96 %  08/23/19 1540 (!) 103/53 99 F (37.2 C) Oral 61 15 97 %  08/23/19 1443 (!) 125/58 98.9 F (37.2 C) Oral 64 16 100 %  08/23/19 1241 (!) 112/49 98.8 F (37.1 C) Oral 73 - 100 %  08/23/19 1156 (!) 110/53 - - 73 - 100 %  08/23/19 1140 (!) 108/51 99.4 F (37.4 C) Oral 72 16 94 %  08/23/19 0724 (!) 93/49 98.2 F (36.8 C) Oral 67 14 100 %     Recent laboratory studies:  Recent Labs    08/22/19 0544 08/23/19 0311 08/23/19 1614  WBC 10.7* 7.6  --   HGB 9.8* 7.3* 9.5*  HCT 31.4* 22.8* 29.1*  PLT 203 161  --   NA 132*  --   --   K 4.4  --   --   CL 100  --   --   CO2 24  --   --   BUN 17  --   --   CREATININE 1.10*  --   --   GLUCOSE 143*  --   --   CALCIUM 7.8*  --   --      Discharge Medications:   Allergies as of 08/24/2019  Reactions   Penicillins Anaphylaxis, Hives, Swelling   Did it involve swelling of the face/tongue/throat, SOB, or low BP? Yes Did it involve sudden or severe rash/hives, skin peeling, or any reaction on the inside of your mouth or nose? No Did you need to seek medical attention at a hospital or doctor's office? Yes When did it last happen?50 years ago If all above answers are "NO", may proceed with cephalosporin use.   Diazepam Palpitations      Medication List    TAKE these medications   acetaminophen-codeine 300-30 MG tablet Commonly known as: TYLENOL #3 TAKE 1-2 TABLETS BY MOUTH EVERY 8 (EIGHT) HOURS AS NEEDED FOR MODERATE PAIN.   albuterol 108 (90 Base) MCG/ACT inhaler Commonly known as: ProAir HFA INHALE 2 PUFFS INTO THE LUNGS TWICE A DAY What changed:   how much to take  how to take this  when to take this  reasons to take this   amLODipine 2.5 MG tablet Commonly known as: NORVASC Take 1 tablet (2.5 mg  total) by mouth daily.   aspirin 81 MG chewable tablet Chew 1 tablet (81 mg total) by mouth 2 (two) times daily. What changed: when to take this   atenolol 25 MG tablet Commonly known as: TENORMIN Take one tablet twice a day as needed for rapid heart rate. What changed:   how much to take  how to take this  when to take this  reasons to take this   celecoxib 200 MG capsule Commonly known as: CeleBREX Take 1 capsule (200 mg total) by mouth 2 (two) times daily between meals as needed. What changed: reasons to take this   clonazePAM 0.5 MG tablet Commonly known as: KLONOPIN Take 0.5 mg by mouth 2 (two) times daily as needed.   diclofenac 75 MG EC tablet Commonly known as: VOLTAREN Take 75 mg by mouth as needed for mild pain (arthritis).   dicyclomine 10 MG capsule Commonly known as: BENTYL Take 10 mg by mouth as needed for spasms.   famotidine 40 MG tablet Commonly known as: PEPCID Take 40 mg by mouth daily.   furosemide 20 MG tablet Commonly known as: LASIX Take 20 mg by mouth as needed for fluid.   gabapentin 300 MG capsule Commonly known as: NEURONTIN Take 300 mg by mouth 2 (two) times daily.   hydrOXYzine 50 MG tablet Commonly known as: ATARAX/VISTARIL Take 1 tablet (50 mg total) by mouth at bedtime.   levothyroxine 50 MCG tablet Commonly known as: SYNTHROID Take 0.5 tablets (25 mcg total) by mouth daily before breakfast.   losartan 100 MG tablet Commonly known as: COZAAR Take 100 mg by mouth daily.   methocarbamol 500 MG tablet Commonly known as: ROBAXIN Take 1 tablet (500 mg total) by mouth every 6 (six) hours as needed for muscle spasms.   methylPREDNISolone 4 MG tablet Commonly known as: Medrol Medrol dose pack. Take as instructed   ondansetron 4 MG tablet Commonly known as: ZOFRAN TAKE 1 TABLET BY MOUTH EVERY 6 HOURS AS NEEDED FOR NAUSEA AND VOMITING What changed: See the new instructions.   oxyCODONE 5 MG immediate release  tablet Commonly known as: Oxy IR/ROXICODONE Take 1-2 tablets (5-10 mg total) by mouth every 4 (four) hours as needed for moderate pain (pain score 4-6).   pantoprazole 40 MG tablet Commonly known as: PROTONIX Take 40 mg by mouth daily.   pregabalin 150 MG capsule Commonly known as: Lyrica Take 1 capsule (150 mg total) by mouth 2 (two) times  daily.   rosuvastatin 5 MG tablet Commonly known as: CRESTOR Take 1 tablet (5 mg total) by mouth 2 (two) times a week. What changed: when to take this   sertraline 50 MG tablet Commonly known as: ZOLOFT Take 50 mg by mouth daily.   Vitamin D 50 MCG (2000 UT) Caps Take 1 capsule (2,000 Units total) by mouth daily. What changed: how much to take            Durable Medical Equipment  (From admission, onward)         Start     Ordered   08/21/19 1708  DME Walker rolling  Once    Question:  Patient needs a walker to treat with the following condition  Answer:  Status post total right knee replacement   08/21/19 1707   08/21/19 1708  DME 3 n 1  Once     08/21/19 1707          Diagnostic Studies: Dg Knee Right Port  Result Date: 08/21/2019 CLINICAL DATA:  Postop right total knee replacement. EXAM: PORTABLE RIGHT KNEE - 1-2 VIEW COMPARISON:  07/16/2019 FINDINGS: Expected changes post total right knee replacement as the prosthetic components are intact and normally located. Small amount of air and fluid in the knee joint. Remainder the exam is unremarkable. IMPRESSION: Expected changes post right total knee arthroplasty. Electronically Signed   By: Marin Olp M.D.   On: 08/21/2019 16:20   Xr Hip Unilat W Or W/o Pelvis 1v Right  Result Date: 08/09/2019 An AP pelvis and lateral right hip shows no acute findings of the right hip.  The joint space is well located and well maintained.   Disposition: Discharge disposition: 01-Home or Self Care         Follow-up Information    [facility]   Specialty: [place] Why: Home Health Physical Therapy Contact information: [address] [place] [address] [phone]        Mcarthur Rossetti, MD. Schedule an appointment as soon as possible for a visit in 2 week(s).   Specialty: Orthopedic Surgery Contact information: [place] [address] [phone]            Signed: Mcarthur Rossetti 08/24/2019, 6:41 AM

## 2019-08-24 NOTE — TOC Transition Note (Signed)
Transition of Care [facility]) - CM/SW Discharge Note   Patient Details  Name: Joann Ray MRN: [id number] Date of Birth: July 13, 1946  [facility]) CM/SW Contact:  Midge Minium RN, BSN, NCM-BC, ACM-RN [phone] Phone Number: 08/24/2019, 9:18 AM   Clinical Narrative:    CM following for dispositional needs. CM spoke to the patient and explained CM role. Patient states living at home with her husband, who has health issues. Patient was independent with her ADLs PTA with no DME in use. Patient is s/p R TKA with PT/OT eval completed; HH/DME recommended. [facility] was arranged PTA, with the patient agreeable to [facility] post discharge. DME preference provided with [employer] selected. DME referral given to Zack ([employer] liaison); AVS updated. CM offered an [facility] aide initially,  to assist with ADL's, with the patient agreeable. Patient states that her son will provide transportation home; Patients DME will be delivered prior to discharge. No further needs from CM.    Final next level of care: [place] Barriers to Discharge: No Barriers Identified   Patient Goals and CMS Choice Patient states their goals for this hospitalization and ongoing recovery are:: "to recover" CMS Medicare.gov Compare Post Acute Care list provided to:: Patient Choice offered to / list presented to : Patient   Discharge Plan and Services         DME Arranged: Bedside commode, Walker rolling DME Agency: [employer] Date DME Agency Contacted: 08/24/19 Time DME Agency Contacted: [phone] Representative spoke with at DME Agency: [place]: PT, OT, Nurse's Aide [place] Agency: [facility] [employer] [facility] [employer] Date [place]: 08/24/19 Time [place]: [phone] Representative spoke with at [place]: [place] ([place]) Interventions     Readmission Risk Interventions No flowsheet data found.

## 2019-08-24 NOTE — Plan of Care (Signed)
Pt for discharge today going home, discontinued peripheral IV line, given health teachings, next appointment, due med explained and understood, given all her personal belongings, waiting for her family to pick her up.

## 2019-08-24 NOTE — Progress Notes (Signed)
Physical Therapy Treatment Patient Details Name: Joann Ray MRN: [id number] DOB: 08/03/1946 Today's Date: 08/24/2019    History of Present Illness Pt is a 73 y/o female s/p R TKA. PMH includes COPD, HTN, fibromyalgia and osteoporosis.     PT Comments    Pt supine in bed on arrival. Pt eager to mobilize and progressing quite well.  She performed increased activity tolerance and successful completion of stair training and review of HEP.  Plan for return today with support from her son.    Follow Up Recommendations  Follow surgeon's recommendation for DC plan and follow-up therapies;Supervision for mobility/OOB     Equipment Recommendations  Rolling walker with 5" wheels;3in1 (PT)    Recommendations for Other Services       Precautions / Restrictions Precautions Precautions: Knee Precaution Booklet Issued: Yes (comment) Restrictions Weight Bearing Restrictions: Yes RLE Weight Bearing: Weight bearing as tolerated    Mobility  Bed Mobility Overal bed mobility: Needs Assistance Bed Mobility: Supine to Sit;Sit to Supine     Supine to sit: Min assist     General bed mobility comments: Cues for sequencing and hand placement.  Increased time and effort with assistance for trunk elevation.  Transfers Overall transfer level: Needs assistance Equipment used: Rolling walker (2 wheeled) Transfers: Sit to/from Stand Sit to Stand: Supervision         General transfer comment: Pt performed with cues for hand placement to and from seated surface.  Ambulation/Gait Ambulation/Gait assistance: Min guard Gait Distance (Feet): 130 Feet Assistive device: Rolling walker (2 wheeled) Gait Pattern/deviations: Decreased weight shift to right;Antalgic;Step-through pattern     General Gait Details: Pt able to progress to step through pattern.  Cues for RW safety.   Stairs Stairs: Yes Stairs assistance: Min guard Stair Management: One rail Right;Sideways Number of Stairs:  6 General stair comments: Cues for sequencing and hand placement on railing.   Wheelchair Mobility    Modified Rankin (Stroke Patients Only)       Balance Overall balance assessment: Needs assistance Sitting-balance support: No upper extremity supported;Feet supported Sitting balance-Leahy Scale: Good     Standing balance support: Bilateral upper extremity supported;During functional activity Standing balance-Leahy Scale: Poor Standing balance comment: Reliant on BUE support                             Cognition Arousal/Alertness: Awake/alert Behavior During Therapy: WFL for tasks assessed/performed Overall Cognitive Status: Within Functional Limits for tasks assessed                                        Exercises Total Joint Exercises Ankle Circles/Pumps: AROM;Both;20 reps;Supine Quad Sets: AROM;Supine;10 reps;Right Heel Slides: 10 reps;Right;AAROM;Supine Hip ABduction/ADduction: AROM;Right;10 reps;Supine Straight Leg Raises: AAROM;Right;10 reps;Supine    General Comments        Pertinent Vitals/Pain Pain Assessment: 0-10 Pain Score: 5  Pain Location: R knee Pain Descriptors / Indicators: Aching;Operative site guarding Pain Intervention(s): Monitored during session;Repositioned    Home Living                      Prior Function            PT Goals (current goals can now be found in the care plan section) Acute Rehab PT Goals Patient Stated Goal: go home Potential to Achieve Goals: Good Progress  towards PT goals: Progressing toward goals    Frequency    7X/week      PT Plan Current plan remains appropriate    Co-evaluation              AM-PAC PT "6 Clicks" Mobility   Outcome Measure  Help needed turning from your back to your side while in a flat bed without using bedrails?: A Little Help needed moving from lying on your back to sitting on the side of a flat bed without using bedrails?: A  Little Help needed moving to and from a bed to a chair (including a wheelchair)?: A Little Help needed standing up from a chair using your arms (e.g., wheelchair or bedside chair)?: A Little Help needed to walk in hospital room?: A Little Help needed climbing 3-5 steps with a railing? : A Little 6 Click Score: 18    End of Session Equipment Utilized During Treatment: Gait belt Activity Tolerance: Patient limited by pain Patient left: in bed;with call bell/phone within reach Nurse Communication: Mobility status PT Visit Diagnosis: Difficulty in walking, not elsewhere classified (R26.2);Pain Pain - Right/Left: Right Pain - part of body: Knee     Time: 0915-0956 PT Time Calculation (min) (ACUTE ONLY): 41 min  Charges:  $Gait Training: 8-22 mins $Therapeutic Exercise: 8-22 mins $Therapeutic Activity: 8-22 mins                     Governor Rooks, PTA Acute Rehabilitation Services Pager [phone] Office [phone]     Joann Ray [name] 08/24/2019, 10:10 AM

## 2019-08-27 ENCOUNTER — Telehealth: Payer: Self-pay

## 2019-08-27 NOTE — Telephone Encounter (Signed)
Verbal order given  

## 2019-08-27 NOTE — Telephone Encounter (Signed)
Nunzio Cory, PT with [facility] would like verbal orders for continuation of PT for ROM, Strengthening, and Mobility Training, 3 x wk for 1wk, and 1 x wk for 1wk.  Cb# is [phone].  Please advise.  Thank you.

## 2019-08-28 ENCOUNTER — Other Ambulatory Visit: Payer: Self-pay | Admitting: Orthopaedic Surgery

## 2019-08-28 ENCOUNTER — Telehealth: Payer: Self-pay | Admitting: Orthopaedic Surgery

## 2019-08-28 MED ORDER — ACETAMINOPHEN-CODEINE #3 300-30 MG PO TABS: 1.0000 | ORAL_TABLET | Freq: Three times a day (TID) | ORAL | 0 refills | Status: DC | PRN

## 2019-08-28 NOTE — Telephone Encounter (Signed)
Please advise 

## 2019-08-28 NOTE — Telephone Encounter (Signed)
Patient called. Says today she will run out of pain meds. Will need a refill on Tylenol #3. Her call back number is [phone]

## 2019-08-30 ENCOUNTER — Telehealth: Payer: Self-pay | Admitting: Orthopaedic Surgery

## 2019-08-30 NOTE — Telephone Encounter (Signed)
FYI

## 2019-08-30 NOTE — Telephone Encounter (Signed)
Roswell Miners, PTA from [facility], called. Says patient has refused services 2x this week. She told them that she would like to start next week. Kasey's call back number is [phone]

## 2019-09-04 ENCOUNTER — Ambulatory Visit (INDEPENDENT_AMBULATORY_CARE_PROVIDER_SITE_OTHER): Payer: Medicare Other | Admitting: Orthopaedic Surgery

## 2019-09-04 ENCOUNTER — Encounter: Payer: Self-pay | Admitting: Orthopaedic Surgery

## 2019-09-04 DIAGNOSIS — Z96652 Presence of left artificial knee joint: Secondary | ICD-10-CM

## 2019-09-04 DIAGNOSIS — Z96651 Presence of right artificial knee joint: Secondary | ICD-10-CM

## 2019-09-04 MED ORDER — OXYCODONE HCL 5 MG PO TABS: 5.0000 mg | ORAL_TABLET | ORAL | 0 refills | Status: DC | PRN

## 2019-09-04 NOTE — Progress Notes (Signed)
HPI: [name] returns 2 weeks status post right total knee arthroplasty.  She is having a lot of pain.  States Tylenol 3 is not working for her.  She is working with home therapy the pain even increases more after therapy.  She has had no shortness of breath, fevers or chills.  Physical exam: Right knee full extension flexion to 90 degrees.  Surgical incisions healing well.  Minimal old blood clot medial distal incision no active drainage.  Right calf supple nontender.  Status post: Right total knee arthroplasty 2 weeks postop  Plan: We will change her pain medication by to the OxyIR.  She will discontinue Tylenol 3.  She is given prescription for outpatient physical therapy.  Steri-Strips are applied to the incision.  She is able to get incision wet in the shower.  She is work on TEFL teacher.  Follow-up with [place] in 1 month sooner if there is any questions concerns.

## 2019-09-10 ENCOUNTER — Telehealth: Payer: Self-pay | Admitting: Orthopaedic Surgery

## 2019-09-10 MED ORDER — OXYCODONE HCL 5 MG PO TABS: 5.0000 mg | ORAL_TABLET | ORAL | 0 refills | Status: DC | PRN

## 2019-09-10 NOTE — Telephone Encounter (Signed)
Please advise 

## 2019-09-10 NOTE — Telephone Encounter (Signed)
Patient called requesting an RX refill on her Oxycodone.  [facility] [place]  Patient is having to take two pills to relieve the pain.  CB#[phone].  Thank you.

## 2019-09-17 ENCOUNTER — Telehealth: Payer: Self-pay | Admitting: Orthopaedic Surgery

## 2019-09-17 MED ORDER — OXYCODONE HCL 5 MG PO TABS: 5.0000 mg | ORAL_TABLET | ORAL | 0 refills | Status: DC | PRN

## 2019-09-17 NOTE — Telephone Encounter (Signed)
Please advise 

## 2019-09-17 NOTE — Telephone Encounter (Signed)
Pt called in requesting a refill on Oxycodone 5 MG and [facility] [place].  [phone]

## 2019-09-25 ENCOUNTER — Other Ambulatory Visit: Payer: Self-pay | Admitting: Orthopaedic Surgery

## 2019-09-25 MED ORDER — OXYCODONE HCL 5 MG PO TABS: 5.0000 mg | ORAL_TABLET | Freq: Four times a day (QID) | ORAL | 0 refills | Status: DC | PRN

## 2019-10-02 ENCOUNTER — Ambulatory Visit (INDEPENDENT_AMBULATORY_CARE_PROVIDER_SITE_OTHER): Payer: Medicare Other | Admitting: Orthopaedic Surgery

## 2019-10-02 ENCOUNTER — Ambulatory Visit (INDEPENDENT_AMBULATORY_CARE_PROVIDER_SITE_OTHER): Payer: Medicare Other

## 2019-10-02 ENCOUNTER — Encounter: Payer: Self-pay | Admitting: Orthopaedic Surgery

## 2019-10-02 DIAGNOSIS — Z96651 Presence of right artificial knee joint: Secondary | ICD-10-CM

## 2019-10-02 MED ORDER — OXYCODONE HCL 5 MG PO TABS: 5.0000 mg | ORAL_TABLET | Freq: Four times a day (QID) | ORAL | 0 refills | Status: DC | PRN

## 2019-10-02 NOTE — Progress Notes (Signed)
Office Visit Note   Patient: Joann Ray           Date of Birth: [birth year]           MRN: [id number] Visit Date: 10/02/2019              Requested by: Christain Sacramento, MD [place],  [place] [address] PCP: Christain Sacramento, MD   Assessment & Plan: Visit Diagnoses:  1. Status post right knee replacement     Plan: Reassurance is given that this pain should subside with time.  Like for her to try some topical Voltaren gel on the knee up to 4 times daily.  She will continue work with physical therapy on strengthening.  Refill on her oxycodone was given today.  Questions encouraged and answered.  She understands that we cannot keep her on oxycodone for prolonged period of time.  Follow-Up Instructions: Return in about 4 weeks (around 10/30/2019).   Orders:  Orders Placed This Encounter  Procedures  . XR Knee 1-2 Views Right   Meds ordered this encounter  Medications  . oxyCODONE (OXY IR/ROXICODONE) 5 MG immediate release tablet    Sig: Take 1-2 tablets (5-10 mg total) by mouth every 6 (six) hours as needed for moderate pain (pain score 4-6).    Dispense:  40 tablet    Refill:  0      Procedures: No procedures performed   Clinical Data: No additional findings.   Subjective: Chief Complaint  Patient presents with  . Right Knee - Routine Post Op    HPI Joann Ray returns today 42 days status post right total knee arthroplasty.  She states she is still having severe pain in the right knee and surprised that she still having pain.  She is ambulating with a walker.  States that pain in the knee wakes her at night.  She having burning pain.  She on Neurontin 300 mg twice daily.  States that the Neurontin and oxycodone oral diclofenac and Robaxin did not help with the pain.  She continues to work with physical therapy twice a week and do her home exercise program in addition.  Review of Systems  See HPI otherwise negative  Objective: Vital  Signs: There were no vitals taken for this visit.  Physical Exam Constitutional:      Appearance: She is not ill-appearing or diaphoretic.  Pulmonary:     Effort: Pulmonary effort is normal.  Neurological:     Mental Status: She is alert and oriented to person, place, and time.     Ortho Exam Right knee full extension flexion to 110 degrees.  No instability valgus varus stressing.  Surgical incisions well-healed no signs of infection.  No abnormal warmth or erythema.  Right calf supple nontender. Specialty Comments:  No specialty comments available.  Imaging: Xr Knee 1-2 Views Right  Result Date: 10/02/2019 Right knee: No acute findings.  Well-seated right total knee arthroplasty components without complication.    PMFS History: Patient Active Problem List   Diagnosis Date Noted  . Status post total right knee replacement 08/21/2019  . Adhesive capsulitis of right shoulder 09/08/2018  . Paroxysmal SVT (supraventricular tachycardia) ([place]) 09/08/2018  . Osteoarthritis of right knee 05/27/2016  . COPD (chronic obstructive pulmonary disease) ([place]) 03/02/2016  . Essential familial hypercholesterolemia 03/02/2016  . IBS (irritable bowel syndrome) 03/02/2016  . Migraine headache 03/02/2016  . Chronic fatigue syndrome 03/02/2016  . Vitamin D deficiency 01/15/2015  . Chronic  back pain 02/13/2014  . PVD (peripheral vascular disease) ([place]) 08/16/2012  . Menopause syndrome 02/28/2012  . Chronic pain 07/27/2011  . Essential hypertension 12/07/2010  . Insomnia 09/07/2010  . Hypothyroidism 07/06/2010  . GAD (generalized anxiety disorder) 07/06/2010  . Fibromyalgia 07/06/2010  . Osteoporosis 07/06/2010   Past Medical History:  Diagnosis Date  . Anemia   . Anxiety   . Arrhythmia     AVNRT  . Asthma   . COPD (chronic obstructive pulmonary disease) ([place])   . Depression   . Family history of adverse reaction to anesthesia    " My yougest daughter gets real sick "  .  Fibromyalgia   . Hyperlipidemia   . Hypertension   . Hypothyroidism   . Migraine   . PONV (postoperative nausea and vomiting)     Family History  Problem Relation Age of Onset  . Colon cancer Sister 45  . Diabetes Mother   . Stroke Mother   . Dementia Mother   . Skin cancer Father   . Stroke Sister   . Breast cancer Paternal Aunt   . Stomach cancer Paternal Aunt     Past Surgical History:  Procedure Laterality Date  . ABDOMINAL HYSTERECTOMY    . APPENDECTOMY    . CARDIAC ELECTROPHYSIOLOGY MAPPING AND ABLATION  08/28/2010   EPS/RFA of AVNRT  - Dr. Crissie Sickles  . CORONARY STENT PLACEMENT  09/2012  . IR GENERIC HISTORICAL  07/29/2016   IR RADIOLOGY PERIPHERAL GUIDED IV START 07/29/2016 Corrie Mckusick, DO [facility]INTER[facility]  . IR GENERIC HISTORICAL  07/29/2016   IR US GUIDE VASC ACCESS RIGHT 07/29/2016 Corrie Mckusick, DO [facility]  . NASAL SINUS SURGERY  1993  . PERCUTANEOUS STENT INTERVENTION Left 10/18/2012   Procedure: PERCUTANEOUS STENT INTERVENTION;  Surgeon: Sherren Mocha, MD;  [facility] LAB;  Service: Cardiovascular;  Laterality: Left;  . TOTAL KNEE ARTHROPLASTY Right 08/21/2019  . TOTAL KNEE ARTHROPLASTY Right 08/21/2019   Procedure: RIGHT TOTAL KNEE ARTHROPLASTY;  Surgeon: Mcarthur Rossetti, MD;  Location: [place];  Service: Orthopedics;  Laterality: Right;  . UNILATERAL UPPER EXTREMEITY ANGIOGRAM Left 10/18/2012   Procedure: UNILATERAL UPPER EXTREMEITY ANGIOGRAM;  Surgeon: Sherren Mocha, MD;  Location[facility] C[facility] LAB;  Service: Cardiovascular;  Laterality: Left;   Social History   Occupational History  . Occupation: DISABLED    Employer: UNEMPLOYED  Tobacco Use  . Smoking status: Former Smoker    Packs/day: 0.50    Years: 40.00    Pack years: 20.00    Types: Cigarettes    Quit date: 2018    Years since quitting: 2.8  . Smokeless tobacco: Never Used  . Tobacco comment: September 2011, again Oct 2013  Substance and Sexual Activity  . Alcohol use: No  . Drug  use: No  . Sexual activity: Not on file

## 2019-10-09 ENCOUNTER — Other Ambulatory Visit: Payer: Self-pay | Admitting: Orthopaedic Surgery

## 2019-10-09 NOTE — Telephone Encounter (Signed)
Please advise 

## 2019-10-11 ENCOUNTER — Telehealth: Payer: Self-pay | Admitting: Orthopaedic Surgery

## 2019-10-11 MED ORDER — OXYCODONE HCL 5 MG PO TABS: 5.0000 mg | ORAL_TABLET | Freq: Four times a day (QID) | ORAL | 0 refills | Status: DC | PRN

## 2019-10-11 NOTE — Telephone Encounter (Signed)
Patient called needing Rx refilled (Oxycodone) [facility] [place] the number to contact patient is [phone]

## 2019-10-11 NOTE — Telephone Encounter (Signed)
Please advise 

## 2019-10-19 ENCOUNTER — Telehealth: Payer: Self-pay | Admitting: Orthopaedic Surgery

## 2019-10-19 MED ORDER — OXYCODONE HCL 5 MG PO TABS: 5.0000 mg | ORAL_TABLET | Freq: Four times a day (QID) | ORAL | 0 refills | Status: DC | PRN

## 2019-10-19 NOTE — Telephone Encounter (Signed)
Please advise 

## 2019-10-19 NOTE — Telephone Encounter (Signed)
I sent some in.  Please let her know that this will be the last oxycodone and that I need to ween her to hydrocodone next because I will need her off of narcotics in another 4-6 weeks.

## 2019-10-19 NOTE — Telephone Encounter (Signed)
Patient called requesting an RX refill on her Oxycodone.  [facility] [place]  CB#[phone].  Thank you.

## 2019-10-24 ENCOUNTER — Other Ambulatory Visit: Payer: Self-pay

## 2019-10-24 ENCOUNTER — Ambulatory Visit (INDEPENDENT_AMBULATORY_CARE_PROVIDER_SITE_OTHER): Payer: Medicare Other | Admitting: Orthopaedic Surgery

## 2019-10-24 ENCOUNTER — Encounter: Payer: Self-pay | Admitting: Orthopaedic Surgery

## 2019-10-24 DIAGNOSIS — Z96651 Presence of right artificial knee joint: Secondary | ICD-10-CM

## 2019-10-24 MED ORDER — HYDROCODONE-ACETAMINOPHEN 5-325 MG PO TABS: 1.0000 | ORAL_TABLET | Freq: Four times a day (QID) | ORAL | 0 refills | Status: DC | PRN

## 2019-10-24 NOTE — Progress Notes (Signed)
The patient is a 73 year old female who is now 50 days status post a right total knee arthroplasty.  She is still having severe pain in her knee and cannot sleep at night.  She has been going to physical therapy as well.  She is walking without an assistive device at this point.  On exam she still lacks full extension by about 5 degrees.  I can flex her to 100 degrees today.  She understands essential she put herself through range of motion the knee.  She also understands will have to start the weaning process with narcotics.  She has been using oral oxycodone but now need to get out hydrocodone.  I sent in that medication today.  We will see her back in 4 weeks to see how she is doing overall.  At that visit I would like an AP and lateral of her right knee.

## 2019-11-01 ENCOUNTER — Ambulatory Visit: Payer: Medicare Other | Admitting: Orthopaedic Surgery

## 2019-11-01 ENCOUNTER — Telehealth: Payer: Self-pay | Admitting: Orthopaedic Surgery

## 2019-11-01 MED ORDER — ACETAMINOPHEN-CODEINE #3 300-30 MG PO TABS: 1.0000 | ORAL_TABLET | Freq: Three times a day (TID) | ORAL | 0 refills | Status: DC | PRN

## 2019-11-01 NOTE — Telephone Encounter (Signed)
Please advise 

## 2019-11-01 NOTE — Telephone Encounter (Signed)
I sent in some  

## 2019-11-01 NOTE — Telephone Encounter (Signed)
Patient called advised the Hydrocodone makes her nauseated. Patient said she stopped taking the Hydrocodone. Patient asked if Dr Ninfa Linden will prescribe Tylenol with codeine. Patient said her leg is still swollen and it hurts. [facility] [place]  The number to contact patient is [phone]

## 2019-11-12 ENCOUNTER — Other Ambulatory Visit: Payer: Self-pay | Admitting: Orthopaedic Surgery

## 2019-11-12 NOTE — Telephone Encounter (Signed)
Please advise 

## 2019-11-21 ENCOUNTER — Ambulatory Visit: Payer: Medicare Other | Admitting: Orthopaedic Surgery

## 2019-11-21 ENCOUNTER — Other Ambulatory Visit: Payer: Self-pay

## 2019-11-21 ENCOUNTER — Ambulatory Visit (INDEPENDENT_AMBULATORY_CARE_PROVIDER_SITE_OTHER): Payer: Medicare Other

## 2019-11-21 ENCOUNTER — Encounter: Payer: Self-pay | Admitting: Orthopaedic Surgery

## 2019-11-21 DIAGNOSIS — Z96651 Presence of right artificial knee joint: Secondary | ICD-10-CM | POA: Diagnosis not present

## 2019-11-21 MED ORDER — HYDROCODONE-ACETAMINOPHEN 5-325 MG PO TABS: 1.0000 | ORAL_TABLET | Freq: Four times a day (QID) | ORAL | 0 refills | Status: DC | PRN

## 2019-11-21 NOTE — Progress Notes (Signed)
The patient comes in today at exactly 3 months status post a right total knee arthroplasty.  She is 73 years old.  She still dealing with significant knee pain.  On examination of her left knee the swelling is down significantly.  Her extension is almost full and I can flex her to past 100 degrees.  The knee feels ligamentously stable.  2 views left knee show well-seated implant with no complicating features or malalignment.  I am concerned about her need for narcotic pain medication and I have counseled her about this.  I will send her in 1 more than of hydrocodone.  We may need to try something else after that try to get her off of these medications.  I recommend anti-inflammatories as well.  I encouraged her to continue to push through the pain and get her knee moving.  It feels stable overall and I gave her reassurance that his knee itself looks good.  We will see her back in 3 months to see how she is doing overall but no x-rays are needed at that visit.

## 2019-12-03 ENCOUNTER — Other Ambulatory Visit: Payer: Self-pay | Admitting: Physician Assistant

## 2019-12-03 NOTE — Telephone Encounter (Signed)
Ok to refill 

## 2019-12-26 ENCOUNTER — Ambulatory Visit: Payer: Medicare Other | Admitting: Orthopaedic Surgery

## 2020-02-04 ENCOUNTER — Ambulatory Visit: Payer: Medicare Other | Admitting: Orthopaedic Surgery

## 2020-02-20 ENCOUNTER — Ambulatory Visit: Payer: Medicare Other | Admitting: Orthopaedic Surgery

## 2020-03-05 ENCOUNTER — Telehealth: Payer: Self-pay | Admitting: Physician Assistant

## 2020-03-05 ENCOUNTER — Ambulatory Visit: Payer: Medicare Other | Admitting: Physician Assistant

## 2020-03-05 ENCOUNTER — Other Ambulatory Visit: Payer: Self-pay

## 2020-03-05 ENCOUNTER — Encounter: Payer: Self-pay | Admitting: Physician Assistant

## 2020-03-05 ENCOUNTER — Telehealth: Payer: Self-pay | Admitting: Radiology

## 2020-03-05 DIAGNOSIS — M79661 Pain in right lower leg: Secondary | ICD-10-CM

## 2020-03-05 DIAGNOSIS — M7989 Other specified soft tissue disorders: Secondary | ICD-10-CM | POA: Diagnosis not present

## 2020-03-05 DIAGNOSIS — Z96651 Presence of right artificial knee joint: Secondary | ICD-10-CM

## 2020-03-05 MED ORDER — HYDROCODONE-ACETAMINOPHEN 5-325 MG PO TABS: 1.0000 | ORAL_TABLET | Freq: Four times a day (QID) | ORAL | 0 refills | Status: DC | PRN

## 2020-03-05 NOTE — Telephone Encounter (Signed)
Spoke with Alyse Low at the [facility]. Location, she received order and will get patient scheduled this week.

## 2020-03-05 NOTE — Telephone Encounter (Signed)
FYI - Placed doppler order on patient, just needs as soon as possible. Patient cannot have appointment on 03/06/20 she has to take her husband to several appointments that day.

## 2020-03-05 NOTE — Telephone Encounter (Signed)
Please advise 

## 2020-03-05 NOTE — Progress Notes (Signed)
Office Visit Note   Patient: Joann Ray           Date of Birth: 11-23-1946           MRN: [id number] Visit Date: 03/05/2020              Requested by: Joann Sacramento, MD [place],  [place] [address] PCP: Joann Sacramento, MD   Assessment & Plan: Visit Diagnoses:  1. Status post right knee replacement   2. Pain of right calf   3. Pain and swelling of right lower leg     Plan: Due to the fact that patient continues to have swelling in her leg which can be normal after total knee replacement even up until 1 year but is having calf pain recommend Doppler of the right leg to rule out DVT.  Otherwise we will have him follow-up with Dr. Ninfa Linden in 2 weeks to see how she is doing in regards to her knee pain.  She will continue work on range of motion strengthening.  Dr. Ninfa Linden did send in some Norco for pain for her earlier today.  Follow-Up Instructions: Return in about 2 weeks (around 03/19/2020).   Orders:  Orders Placed This Encounter  Procedures  . VAS [place] LOWER EXTREMITY VENOUS (DVT)   No orders of the defined types were placed in this encounter.     Procedures: No procedures performed   Clinical Data: No additional findings.   Subjective: Chief Complaint  Patient presents with  . Right Knee - Pain    HPI Joann Ray comes in today now 6 6-1/2 months status post right total knee arthroplasty.  She states she is disappointed with her right total knee.  She still having pain in the knee.  Feels as if it will give way at times.  She still having what she describes a significant swelling of her left leg and calf pain.  She had no shortness of breath fevers chills.  States knee wakes her up at night she has burning pain in the knee.  States she still cannot fully weight-bear on the leg when going up or down stairs.  Has taken Motrin and Tylenol without any real relief. Review of Systems See HPI otherwise negative  Objective: Vital  Signs: There were no vitals taken for this visit.  Physical Exam Constitutional:      General: She is not in acute distress.    Appearance: She is not ill-appearing or diaphoretic.  Pulmonary:     Effort: Pulmonary effort is normal.  Neurological:     Mental Status: She is alert and oriented to person, place, and time.  Psychiatric:        Mood and Affect: Mood normal.        Behavior: Behavior normal.     Ortho Exam Right knee surgical incisions well-healed no signs of infection.  No instability valgus varus stressing.  She has tenderness in the right calf which is supple.  No significant edema of the right lower leg compared to the left.  No instability valgus varus stressing.  Anterior drawer is negative right knee.  Right knee full extension and flexion to 105 degrees.  Specialty Comments:  No specialty comments available.  Imaging: No results found.   PMFS History: Patient Active Problem List   Diagnosis Date Noted  . Status post total right knee replacement 08/21/2019  . Adhesive capsulitis of right shoulder 09/08/2018  . Paroxysmal SVT (supraventricular tachycardia) (HCC)  09/08/2018  . Osteoarthritis of right knee 05/27/2016  . COPD (chronic obstructive pulmonary disease) ([place]) 03/02/2016  . Essential familial hypercholesterolemia 03/02/2016  . IBS (irritable bowel syndrome) 03/02/2016  . Migraine headache 03/02/2016  . Chronic fatigue syndrome 03/02/2016  . Vitamin D deficiency 01/15/2015  . Chronic back pain 02/13/2014  . PVD (peripheral vascular disease) ([place]) 08/16/2012  . Menopause syndrome 02/28/2012  . Chronic pain 07/27/2011  . Essential hypertension 12/07/2010  . Insomnia 09/07/2010  . Hypothyroidism 07/06/2010  . GAD (generalized anxiety disorder) 07/06/2010  . Fibromyalgia 07/06/2010  . Osteoporosis 07/06/2010   Past Medical History:  Diagnosis Date  . Anemia   . Anxiety   . Arrhythmia     AVNRT  . Asthma   . COPD (chronic obstructive  pulmonary disease) ([place])   . Depression   . Family history of adverse reaction to anesthesia    " My yougest daughter gets real sick "  . Fibromyalgia   . Hyperlipidemia   . Hypertension   . Hypothyroidism   . Migraine   . PONV (postoperative nausea and vomiting)     Family History  Problem Relation Age of Onset  . Colon cancer Sister 81  . Diabetes Mother   . Stroke Mother   . Dementia Mother   . Skin cancer Father   . Stroke Sister   . Breast cancer Paternal Aunt   . Stomach cancer Paternal Aunt     Past Surgical History:  Procedure Laterality Date  . ABDOMINAL HYSTERECTOMY    . APPENDECTOMY    . CARDIAC ELECTROPHYSIOLOGY MAPPING AND ABLATION  08/28/2010   EPS/RFA of AVNRT  - Dr. Crissie Sickles  . CORONARY STENT PLACEMENT  09/2012  . IR GENERIC HISTORICAL  07/29/2016   IR RADIOLOGY PERIPHERAL GUIDED IV START 07/29/2016 Corrie Mckusick, DO [facility]  . IR GENERIC HISTORICAL  07/29/2016   IR US GUIDE VASC ACCESS RIGHT 07/29/2016 Corrie Mckusick, DO [facility]  . NASAL SINUS SURGERY  1993  . PERCUTANEOUS STENT INTERVENTION Left 10/18/2012   Procedure: PERCUTANEOUS STENT INTERVENTION;  Surgeon: Sherren Mocha, MD;  Location: [facility] CATH LAB;  Service: Cardiovascular;  Laterality: Left;  . TOTAL KNEE ARTHROPLASTY Right 08/21/2019  . TOTAL KNEE ARTHROPLASTY Right 08/21/2019   Procedure: RIGHT TOTAL KNEE ARTHROPLASTY;  Surgeon: Mcarthur Rossetti, MD;  Location: [place];  Service: Orthopedics;  Laterality: Right;  . UNILATERAL UPPER EXTREMEITY ANGIOGRAM Left 10/18/2012   Procedure: UNILATERAL UPPER EXTREMEITY ANGIOGRAM;  Surgeon: Sherren Mocha, MD;  Location: [facility] CATH LAB;  Service: Cardiovascular;  Laterality: Left;   Social History   Occupational History  . Occupation: DISABLED    Employer: UNEMPLOYED  Tobacco Use  . Smoking status: Former Smoker    Packs/day: 0.50    Years: 40.00    Pack years: 20.00    Types: Cigarettes    Quit date: 2018    Years since quitting: 3.2  .  Smokeless tobacco: Never Used  . Tobacco comment: September 2011, again Oct 2013  Substance and Sexual Activity  . Alcohol use: No  . Drug use: No  . Sexual activity: Not on file

## 2020-03-05 NOTE — Telephone Encounter (Signed)
I just sent it in today.

## 2020-03-05 NOTE — Telephone Encounter (Signed)
Patient called advised the Rx for Hydrocodone is not showing received at the pharmacy yet. The number to contact patient is [phone]

## 2020-03-06 NOTE — Telephone Encounter (Signed)
Patient did get medication.

## 2020-03-06 NOTE — Telephone Encounter (Signed)
Tried calling patient to advise.  She answered said did not have good reception and would call back when she did.

## 2020-03-10 ENCOUNTER — Ambulatory Visit (HOSPITAL_COMMUNITY)
Admission: RE | Admit: 2020-03-10 | Discharge: 2020-03-10 | Disposition: A | Discharge status: Home / Self Care | Payer: Medicare Other | Source: Ambulatory Visit | Attending: Physician Assistant | Admitting: Physician Assistant

## 2020-03-10 ENCOUNTER — Other Ambulatory Visit: Payer: Self-pay

## 2020-03-10 DIAGNOSIS — M7989 Other specified soft tissue disorders: Secondary | ICD-10-CM | POA: Insufficient documentation

## 2020-03-10 DIAGNOSIS — M79661 Pain in right lower leg: Secondary | ICD-10-CM | POA: Diagnosis present

## 2020-03-10 DIAGNOSIS — Z96651 Presence of right artificial knee joint: Secondary | ICD-10-CM | POA: Insufficient documentation

## 2020-03-19 ENCOUNTER — Encounter: Payer: Self-pay | Admitting: Physician Assistant

## 2020-03-19 ENCOUNTER — Ambulatory Visit: Payer: Medicare Other | Admitting: Physician Assistant

## 2020-03-19 ENCOUNTER — Other Ambulatory Visit: Payer: Self-pay

## 2020-03-19 DIAGNOSIS — M25861 Other specified joint disorders, right knee: Secondary | ICD-10-CM | POA: Diagnosis not present

## 2020-03-19 DIAGNOSIS — Z96651 Presence of right artificial knee joint: Secondary | ICD-10-CM

## 2020-03-19 MED ORDER — ACETAMINOPHEN-CODEINE #3 300-30 MG PO TABS: 1.0000 | ORAL_TABLET | ORAL | 0 refills | Status: DC | PRN

## 2020-03-19 NOTE — Progress Notes (Signed)
HPI: Mrs. Addeo returns today follow-up of her right knee.  She did have a ultrasound of her right lower extremity rule out DVT.  This is negative for DVT or SVT.  Cystic structure was seen in the popliteal fossa measuring 2.22 by 2.09 cm.  She continues have pain in the back of the knee.  She is now 6 months and 30 days status post a right total knee arthroplasty.  States she is having significant pain that radiates down the leg.  She describes the pain is constant.  No numbness tingling.  Pain is burning sharp pain.  Physical exam right knee range of motion.  No instability valgus varus stressing.  Anterior drawer's negative.  Calf supple nontender.  Surgical incisions well-healed.  No signs of infection.  Again no significant edema of the right lower leg compared to left.  Tenderness in the popliteal region of the right knee.  Impression: Status post right total knee arthroplasty 08/21/2019 Cystic structure popliteal fossa right knee  Plan: We will obtain an MRI with and without contrast to evaluate the cystic structure in the back of her knee.  Have her follow-up after the MRI to go over results and discuss further treatment.  Questions encouraged and answered.

## 2020-03-19 NOTE — Addendum Note (Signed)
Addended by: Michae Kava B on: 03/19/2020 03:35 PM   Modules accepted: Orders

## 2020-03-24 ENCOUNTER — Telehealth: Payer: Self-pay | Admitting: Orthopaedic Surgery

## 2020-03-24 MED ORDER — ACETAMINOPHEN-CODEINE #3 300-30 MG PO TABS: 1.0000 | ORAL_TABLET | Freq: Three times a day (TID) | ORAL | 0 refills | Status: DC | PRN

## 2020-03-24 NOTE — Telephone Encounter (Signed)
Patient called. She would like a refill on Tylenol #3. Her call back number is [phone]

## 2020-03-24 NOTE — Telephone Encounter (Signed)
Please advise 

## 2020-03-31 ENCOUNTER — Telehealth: Payer: Self-pay | Admitting: Orthopaedic Surgery

## 2020-03-31 MED ORDER — ACETAMINOPHEN-CODEINE #3 300-30 MG PO TABS: 1.0000 | ORAL_TABLET | Freq: Three times a day (TID) | ORAL | 0 refills | Status: DC | PRN

## 2020-03-31 NOTE — Telephone Encounter (Signed)
Please advise 

## 2020-03-31 NOTE — Telephone Encounter (Signed)
Patient called requesting an RX refill on the Tylenol #3.  [facility] [place]  Patient is scheduled for an MRI, but not until the end of this month.  CB#[phone].  Thank you.

## 2020-04-09 ENCOUNTER — Other Ambulatory Visit: Payer: Self-pay | Admitting: Orthopaedic Surgery

## 2020-04-09 ENCOUNTER — Telehealth: Payer: Self-pay | Admitting: Orthopaedic Surgery

## 2020-04-09 NOTE — Telephone Encounter (Signed)
Patient called needing Rx refilled Tylenol 3. The number to contact patient is [phone] °

## 2020-04-09 NOTE — Telephone Encounter (Signed)
Please advise 

## 2020-04-09 NOTE — Telephone Encounter (Signed)
Patient called about update of Tylenol 3 being sent to pharmacy on file. Please give patient a call when prescription has been sent to pharmacy. Patient phone number is [phone]

## 2020-04-15 ENCOUNTER — Other Ambulatory Visit: Payer: Self-pay | Admitting: Orthopaedic Surgery

## 2020-04-15 NOTE — Telephone Encounter (Signed)
Please advise 

## 2020-04-21 ENCOUNTER — Other Ambulatory Visit: Payer: Self-pay | Admitting: Orthopaedic Surgery

## 2020-04-21 NOTE — Telephone Encounter (Signed)
Patient aware of the below message  

## 2020-04-21 NOTE — Telephone Encounter (Signed)
Do let her know that this is the last time I can send in a narcotic since she is far out from her original surgery.  She needs to be off narcotics at this standpoint.

## 2020-04-21 NOTE — Telephone Encounter (Signed)
Please advise 

## 2020-04-23 ENCOUNTER — Ambulatory Visit
Admission: RE | Admit: 2020-04-23 | Discharge: 2020-04-23 | Disposition: A | Discharge status: Home / Self Care | Payer: Medicare Other | Source: Ambulatory Visit | Attending: Physician Assistant | Admitting: Physician Assistant

## 2020-04-23 ENCOUNTER — Other Ambulatory Visit: Payer: Self-pay

## 2020-04-23 DIAGNOSIS — Z96651 Presence of right artificial knee joint: Secondary | ICD-10-CM

## 2020-04-23 DIAGNOSIS — M25861 Other specified joint disorders, right knee: Secondary | ICD-10-CM

## 2020-04-23 MED ORDER — GADOBENATE DIMEGLUMINE 529 MG/ML IV SOLN
12.0000 mL | Freq: Once | INTRAVENOUS | Status: AC | PRN
  Administered 2020-04-23: 12 mL via INTRAVENOUS

## 2020-04-29 ENCOUNTER — Encounter: Payer: Self-pay | Admitting: Orthopaedic Surgery

## 2020-04-29 ENCOUNTER — Other Ambulatory Visit: Payer: Self-pay

## 2020-04-29 ENCOUNTER — Ambulatory Visit: Payer: Medicare Other | Admitting: Orthopaedic Surgery

## 2020-04-29 DIAGNOSIS — M7121 Synovial cyst of popliteal space [Baker], right knee: Secondary | ICD-10-CM

## 2020-04-29 DIAGNOSIS — Z96651 Presence of right artificial knee joint: Secondary | ICD-10-CM | POA: Diagnosis not present

## 2020-04-29 MED ORDER — ACETAMINOPHEN-CODEINE 300-30 MG PO TABS: 1.0000 | ORAL_TABLET | Freq: Four times a day (QID) | ORAL | 0 refills | Status: DC | PRN

## 2020-04-29 NOTE — Progress Notes (Deleted)
Office Visit Note   Patient: Joann Ray           Date of Birth: Feb 13, 1946           MRN: [id number] Visit Date: 04/29/2020              Requested by: Christain Sacramento, MD [address] [place],  [place] [address] PCP: Christain Sacramento, MD   Assessment & Plan: Visit Diagnoses: No diagnosis found.  Plan: ***  Follow-Up Instructions: No follow-ups on file.   Orders:  No orders of the defined types were placed in this encounter.  No orders of the defined types were placed in this encounter.     Procedures: No procedures performed   Clinical Data: No additional findings.   Subjective: Chief Complaint  Patient presents with  . Right Knee - Follow-up    HPI  Review of Systems   Objective: Vital Signs: There were no vitals taken for this visit.  Physical Exam  Ortho Exam  Specialty Comments:  No specialty comments available.  Imaging: No results found.   PMFS History: Patient Active Problem List   Diagnosis Date Noted  . Status post total right knee replacement 08/21/2019  . Adhesive capsulitis of right shoulder 09/08/2018  . Paroxysmal SVT (supraventricular tachycardia) ([place]) 09/08/2018  . Osteoarthritis of right knee 05/27/2016  . COPD (chronic obstructive pulmonary disease) ([place]) 03/02/2016  . Essential familial hypercholesterolemia 03/02/2016  . IBS (irritable bowel syndrome) 03/02/2016  . Migraine headache 03/02/2016  . Chronic fatigue syndrome 03/02/2016  . Vitamin D deficiency 01/15/2015  . Chronic back pain 02/13/2014  . PVD (peripheral vascular disease) ([place]) 08/16/2012  . Menopause syndrome 02/28/2012  . Chronic pain 07/27/2011  . Essential hypertension 12/07/2010  . Insomnia 09/07/2010  . Hypothyroidism 07/06/2010  . GAD (generalized anxiety disorder) 07/06/2010  . Fibromyalgia 07/06/2010  . Osteoporosis 07/06/2010   Past Medical History:  Diagnosis Date  . Anemia   . Anxiety   . Arrhythmia     AVNRT  .  Asthma   . COPD (chronic obstructive pulmonary disease) ([place])   . Depression   . Family history of adverse reaction to anesthesia    " My yougest daughter gets real sick "  . Fibromyalgia   . Hyperlipidemia   . Hypertension   . Hypothyroidism   . Migraine   . PONV (postoperative nausea and vomiting)     Family History  Problem Relation Age of Onset  . Colon cancer Sister 1  . Diabetes Mother   . Stroke Mother   . Dementia Mother   . Skin cancer Father   . Stroke Sister   . Breast cancer Paternal Aunt   . Stomach cancer Paternal Aunt     Past Surgical History:  Procedure Laterality Date  . ABDOMINAL HYSTERECTOMY    . APPENDECTOMY    . CARDIAC ELECTROPHYSIOLOGY MAPPING AND ABLATION  08/28/2010   EPS/RFA of AVNRT  - Dr. Crissie Sickles  . CORONARY STENT PLACEMENT  09/2012  . IR GENERIC HISTORICAL  07/29/2016   IR RADIOLOGY PERIPHERAL GUIDED IV START 07/29/2016 Corrie Mckusick, DO [facility]  . IR GENERIC HISTORICAL  07/29/2016   IR US GUIDE VASC ACCESS RIGHT 07/29/2016 Corrie Mckusick, DO [facility]  . NASAL SINUS SURGERY  1993  . PERCUTANEOUS STENT INTERVENTION Left 10/18/2012   Procedure: PERCUTANEOUS STENT INTERVENTION;  Surgeon: Sherren Mocha, MD;  Location: [facility] CATH LAB;  Service: Cardiovascular;  Laterality:  Left;  . TOTAL KNEE ARTHROPLASTY Right 08/21/2019  . TOTAL KNEE ARTHROPLASTY Right 08/21/2019   Procedure: RIGHT TOTAL KNEE ARTHROPLASTY;  Surgeon: Mcarthur Rossetti, MD;  Location: [place];  Service: Orthopedics;  Laterality: Right;  . UNILATERAL UPPER EXTREMEITY ANGIOGRAM Left 10/18/2012   Procedure: UNILATERAL UPPER EXTREMEITY ANGIOGRAM;  Surgeon: Sherren Mocha, MD;  Location: [facility] CATH LAB;  Service: Cardiovascular;  Laterality: Left;   Social History   Occupational History  . Occupation: DISABLED    Employer: UNEMPLOYED  Tobacco Use  . Smoking status: Former Smoker    Packs/day: 0.50    Years: 40.00    Pack years: 20.00    Types: Cigarettes    Quit date:  2018    Years since quitting: 3.4  . Smokeless tobacco: Never Used  . Tobacco comment: September 2011, again Oct 2013  Substance and Sexual Activity  . Alcohol use: No  . Drug use: No  . Sexual activity: Not on file

## 2020-04-29 NOTE — Progress Notes (Signed)
HPI: [name] returns today for MRI of her right knee.  She status post right total knee arthroplasty September 2020.  Pain in the knee particularly with weightbearing activities.  She notes swelling right knee popliteal region.  MRI is reviewed of the right knee and shows Baker's cyst.  No gross loosening or failure of the implants.  Physical exam: Right knee full range of motion.  Surgical incisions well-healed.  No abnormal warmth erythema or effusion.  No instability valgus varus stressing.  Slight fullness in the posterior aspect of the knee.  Impression: Status post right total knee arthroplasty 08/21/2019  Plan: Recommend she continue to work on strengthening.  However she could have some swelling pain in the knee for up to a year.  Recommend she is compression hose thigh-high on the right leg.  She did ask for refill on her Tylenol 3.  Gave her a one-time refill on Tylenol 3.  Follow-up with [place] in September for AP lateral views of the right knee.  Sooner if there is any questions or concerns peer

## 2020-05-07 ENCOUNTER — Other Ambulatory Visit: Payer: Self-pay | Admitting: Orthopaedic Surgery

## 2020-05-07 ENCOUNTER — Telehealth: Payer: Self-pay | Admitting: Orthopaedic Surgery

## 2020-05-07 MED ORDER — ACETAMINOPHEN-CODEINE 300-30 MG PO TABS: 1.0000 | ORAL_TABLET | Freq: Two times a day (BID) | ORAL | 0 refills | Status: DC | PRN

## 2020-05-07 NOTE — Telephone Encounter (Signed)
LMOM for patient of the below message and she may call back and schedule an appt with [facility]

## 2020-05-07 NOTE — Telephone Encounter (Signed)
Patient called asked if the Rx for Tylenol 3 [facility] [address]. Patient asked if there is a specialist that can remove the baker's cyst. Patient said the baker's cyst is very painful and continues to swell when she standing,walking and sitting down for any length of time. The number to contact patient is [phone]

## 2020-05-07 NOTE — Telephone Encounter (Signed)
There is no one that does remove bakers cysts because of the complications of the surgery from the back of the knee.  Please schedule her an appointment to see Dr. Junius Roads to see if he can evaluate it under ultrasound and aspirate the Baker's cyst.

## 2020-05-16 ENCOUNTER — Ambulatory Visit: Payer: Self-pay

## 2020-05-16 ENCOUNTER — Ambulatory Visit (INDEPENDENT_AMBULATORY_CARE_PROVIDER_SITE_OTHER): Payer: Medicare Other | Admitting: Family Medicine

## 2020-05-16 ENCOUNTER — Other Ambulatory Visit: Payer: Self-pay

## 2020-05-16 ENCOUNTER — Encounter: Payer: Self-pay | Admitting: Family Medicine

## 2020-05-16 VITALS — Ht 62.0 in | Wt 139.0 lb

## 2020-05-16 DIAGNOSIS — M7121 Synovial cyst of popliteal space [Baker], right knee: Secondary | ICD-10-CM | POA: Diagnosis not present

## 2020-05-16 MED ORDER — TRAMADOL HCL 50 MG PO TABS: 50.0000 mg | ORAL_TABLET | Freq: Four times a day (QID) | ORAL | 0 refills | Status: DC | PRN

## 2020-05-16 NOTE — Addendum Note (Signed)
Addended by: Hortencia Pilar on: 05/16/2020 02:57 PM   Modules accepted: Orders

## 2020-05-16 NOTE — Progress Notes (Signed)
Subjective: She is here for evaluation of right posterior knee pain.  She is status post knee replacement but continues to have pain in the back of her knee with standing and walking.  Sometimes she feels a burning pain around to the front of the knee and sometimes the pain seems to radiate up towards the posterior hip.  Objective: She is tender to palpation on the posterior medial aspect of the knee.  Limited diagnostic ultrasound right knee: She has a popliteal cyst on the posterior medial knee with a little bit of hyperemia on power Doppler imaging.  Impression: Right posterior knee pain possibly due to popliteal cyst.  Cannot rule out lumbar radiculopathy.  Plan: She recently found out that her brother has metastatic colon cancer.  She may have to go out of state to help him.  She wants to wait until later to have the cyst aspirated.  She will call me when she is ready to proceed.

## 2020-05-29 ENCOUNTER — Telehealth: Payer: Self-pay | Admitting: Orthopaedic Surgery

## 2020-05-29 ENCOUNTER — Other Ambulatory Visit: Payer: Self-pay

## 2020-05-29 ENCOUNTER — Telehealth: Payer: Self-pay | Admitting: Family Medicine

## 2020-05-29 DIAGNOSIS — M25551 Pain in right hip: Secondary | ICD-10-CM

## 2020-05-29 DIAGNOSIS — Z96652 Presence of left artificial knee joint: Secondary | ICD-10-CM

## 2020-05-29 DIAGNOSIS — Z96651 Presence of right artificial knee joint: Secondary | ICD-10-CM

## 2020-05-29 DIAGNOSIS — M5431 Sciatica, right side: Secondary | ICD-10-CM

## 2020-05-29 DIAGNOSIS — M7989 Other specified soft tissue disorders: Secondary | ICD-10-CM

## 2020-05-29 MED ORDER — ACETAMINOPHEN-CODEINE #3 300-30 MG PO TABS: 1.0000 | ORAL_TABLET | Freq: Two times a day (BID) | ORAL | 0 refills | Status: DC | PRN

## 2020-05-29 NOTE — Telephone Encounter (Signed)
Pt called stating she will discuss pain management at her appt.

## 2020-05-29 NOTE — Telephone Encounter (Signed)
I did send in some Tylenol 3 to her pharmacy.  We are at the point where she does need referral to chronic pain management.  See if she would like to have this because we cannot keep her on long-term narcotics.

## 2020-05-29 NOTE — Telephone Encounter (Signed)
As Dr. Junius Roads is not in clinic this afternoon, and this patient was first Dr. Trevor Mace, do you think he could address this?

## 2020-05-29 NOTE — Telephone Encounter (Signed)
Pt called stating the tramadol isn't helping and would like to know if she could be prescribed something else?  [phone]

## 2020-05-29 NOTE — Telephone Encounter (Signed)
LMOM for patient that I was scheduling for this

## 2020-05-29 NOTE — Telephone Encounter (Signed)
Please advise 

## 2020-05-30 ENCOUNTER — Ambulatory Visit: Payer: Medicare Other | Admitting: Family Medicine

## 2020-06-03 ENCOUNTER — Ambulatory Visit: Payer: Medicare Other | Admitting: Family Medicine

## 2020-06-10 ENCOUNTER — Ambulatory Visit: Payer: Medicare Other | Admitting: Family Medicine

## 2020-06-24 ENCOUNTER — Telehealth: Payer: Self-pay | Admitting: Orthopaedic Surgery

## 2020-06-24 ENCOUNTER — Other Ambulatory Visit: Payer: Self-pay | Admitting: Orthopaedic Surgery

## 2020-06-24 ENCOUNTER — Telehealth: Payer: Self-pay

## 2020-06-24 MED ORDER — ACETAMINOPHEN-CODEINE #3 300-30 MG PO TABS: 1.0000 | ORAL_TABLET | Freq: Two times a day (BID) | ORAL | 0 refills | Status: DC | PRN

## 2020-06-24 MED ORDER — METHOCARBAMOL 500 MG PO TABS: 500.0000 mg | ORAL_TABLET | Freq: Four times a day (QID) | ORAL | 1 refills | Status: DC | PRN

## 2020-06-24 NOTE — Telephone Encounter (Signed)
Patient called in wanting to get pain medication says is hard for her to stand

## 2020-06-24 NOTE — Telephone Encounter (Signed)
Patient called.  She is requesting a refill on her Robaxin.   Call back: [phone]

## 2020-06-24 NOTE — Telephone Encounter (Signed)
Pt called anting to see if she could get her medicine called in due to her pain levels rising and she's not sure she can make it to 06/27/20

## 2020-06-24 NOTE — Telephone Encounter (Signed)
I sent in medications for her.  I had wanted her to have pain management before.  Any idea where that stands?

## 2020-06-24 NOTE — Telephone Encounter (Signed)
Please advise She is also asking for pain medication, states it is hard for her to stand without pain

## 2020-06-24 NOTE — Telephone Encounter (Signed)
Still pending

## 2020-06-27 ENCOUNTER — Other Ambulatory Visit: Payer: Self-pay

## 2020-06-27 ENCOUNTER — Encounter: Payer: Self-pay | Admitting: Family Medicine

## 2020-06-27 ENCOUNTER — Ambulatory Visit: Payer: Self-pay

## 2020-06-27 ENCOUNTER — Ambulatory Visit (INDEPENDENT_AMBULATORY_CARE_PROVIDER_SITE_OTHER): Payer: Medicare Other | Admitting: Family Medicine

## 2020-06-27 DIAGNOSIS — M7121 Synovial cyst of popliteal space [Baker], right knee: Secondary | ICD-10-CM

## 2020-06-27 NOTE — Progress Notes (Signed)
Subjective: She is here for aspiration of right knee Baker's cyst.  Objective: She has some tenderness in the posterior knee, no erythema or warmth.  Procedure: Ultrasound-guided Baker's cyst aspiration: After sterile prep with Betadine, injected 5 cc 1% lidocaine without epinephrine for superficial anesthesia, then aspirated only about 5 cc of blood-tinged synovial fluid.  There appeared to be more fluid in the cyst, but I was unable to aspirate any more.  After discussion with patient and her daughter, they were wondering whether a cortisone injection might be tried.  I contacted Dr. Ninfa Linden who said this should be okay.  But upon further discussion, patient was concerned about the possibility of infection and chose not to proceed with that.  She can follow-up in the future for aspiration again if needed.

## 2020-07-16 ENCOUNTER — Telehealth: Payer: Self-pay | Admitting: Orthopaedic Surgery

## 2020-07-16 MED ORDER — ACETAMINOPHEN-CODEINE #3 300-30 MG PO TABS: 1.0000 | ORAL_TABLET | Freq: Two times a day (BID) | ORAL | 0 refills | Status: DC | PRN

## 2020-07-16 NOTE — Telephone Encounter (Signed)
Please advise 

## 2020-07-16 NOTE — Telephone Encounter (Signed)
Patient called requesting a refill of tylenol 3 to pharmacy [facility] [place]. Please call patient when medication has been sent in. Patient phone number is [phone].

## 2020-07-28 ENCOUNTER — Telehealth: Payer: Self-pay | Admitting: Orthopaedic Surgery

## 2020-07-28 MED ORDER — ACETAMINOPHEN-CODEINE #3 300-30 MG PO TABS: 1.0000 | ORAL_TABLET | Freq: Two times a day (BID) | ORAL | 0 refills | Status: DC | PRN

## 2020-07-28 NOTE — Telephone Encounter (Signed)
°

## 2020-07-28 NOTE — Telephone Encounter (Signed)
Patient called needing Rx refilled Tylenol #3    [facility] [place]. Patient has an appointment 08/13/2020 with Dr Junius Roads. The number to contact patient is [phone]

## 2020-08-13 ENCOUNTER — Ambulatory Visit: Payer: Medicare Other | Admitting: Family Medicine

## 2020-08-15 ENCOUNTER — Telehealth: Payer: Self-pay | Admitting: Orthopaedic Surgery

## 2020-08-15 MED ORDER — ACETAMINOPHEN-CODEINE #3 300-30 MG PO TABS: 1.0000 | ORAL_TABLET | Freq: Two times a day (BID) | ORAL | 0 refills | Status: DC | PRN

## 2020-08-15 NOTE — Telephone Encounter (Signed)
Please advise 

## 2020-08-15 NOTE — Telephone Encounter (Signed)
Patient called requesting a refill of tylenol 3. Please send to pharmacy on file. Patient phone number is [phone]. 

## 2020-08-26 ENCOUNTER — Telehealth: Payer: Self-pay | Admitting: Orthopaedic Surgery

## 2020-08-26 MED ORDER — ACETAMINOPHEN-CODEINE #3 300-30 MG PO TABS: 1.0000 | ORAL_TABLET | Freq: Two times a day (BID) | ORAL | 0 refills | Status: DC | PRN

## 2020-08-26 NOTE — Telephone Encounter (Signed)
Please advise. Thanks.  

## 2020-08-26 NOTE — Telephone Encounter (Signed)
Patient called requesting Tylenol 3 refill. Please send to pharmacy on file. Patient phone number is [phone].

## 2020-09-03 ENCOUNTER — Ambulatory Visit: Payer: Medicare Other | Admitting: Orthopaedic Surgery

## 2020-09-08 ENCOUNTER — Telehealth: Payer: Self-pay | Admitting: Orthopaedic Surgery

## 2020-09-08 MED ORDER — ACETAMINOPHEN-CODEINE #3 300-30 MG PO TABS: 1.0000 | ORAL_TABLET | Freq: Two times a day (BID) | ORAL | 0 refills | Status: DC | PRN

## 2020-09-08 NOTE — Telephone Encounter (Signed)
Please advise 

## 2020-09-08 NOTE — Telephone Encounter (Signed)
Patient called needing Rx refilled Tylenol 3. The number to contact patient is [phone] °

## 2020-09-08 NOTE — Telephone Encounter (Signed)
I will provided this 1 last time.  She will need to be seen the next time she wants this type of pain medication because we really need to have her off of it since it is a narcotic.

## 2020-09-08 NOTE — Telephone Encounter (Signed)
Patient aware.

## 2020-09-15 ENCOUNTER — Other Ambulatory Visit: Payer: Self-pay | Admitting: Family Medicine

## 2020-09-15 DIAGNOSIS — Z1231 Encounter for screening mammogram for malignant neoplasm of breast: Secondary | ICD-10-CM

## 2020-09-17 ENCOUNTER — Ambulatory Visit: Payer: Medicare Other | Admitting: Orthopaedic Surgery

## 2020-09-17 ENCOUNTER — Ambulatory Visit: Payer: Self-pay

## 2020-09-17 ENCOUNTER — Encounter: Payer: Self-pay | Admitting: Orthopaedic Surgery

## 2020-09-17 VITALS — Ht 62.0 in | Wt 135.0 lb

## 2020-09-17 DIAGNOSIS — Z96651 Presence of right artificial knee joint: Secondary | ICD-10-CM | POA: Diagnosis not present

## 2020-09-17 MED ORDER — ACETAMINOPHEN-CODEINE #3 300-30 MG PO TABS
1.0000 | ORAL_TABLET | Freq: Two times a day (BID) | ORAL | 0 refills | Status: DC | PRN
Start: 2020-09-17 — End: 2020-10-08

## 2020-09-17 NOTE — Progress Notes (Signed)
Office Visit Note   Patient: Joann Ray           Date of Birth: 1946-03-02           MRN: [id number] Visit Date: 09/17/2020              Requested by: Christain Sacramento, MD [place],  [place] [address] PCP: Christain Sacramento, MD   Assessment & Plan: Visit Diagnoses:  1. Status post total right knee replacement     Plan: Unfortunately I do feel that the narcotics are contributing to her pain centers and her continued experience of pain with the right knee.  I do feel that at some point we need to have her wean from these medications or send her to chronic pain management.  I would like to send her to outpatient physical therapy to see if there is any modalities they can do to desensitize the right knee and get stronger and hopefully hurting less.  She agreed to physical therapy and [place].  I will see her back about 6 weeks to see how therapy has gone for her.  I did send in some more Tylenol 3.  Follow-Up Instructions: Return in about 6 weeks (around 10/29/2020).   Orders:  Orders Placed This Encounter  Procedures  . XR Knee 1-2 Views Right   Meds ordered this encounter  Medications  . acetaminophen-codeine (TYLENOL #3) 300-30 MG tablet    Sig: Take 1-2 tablets by mouth 2 (two) times daily as needed for moderate pain.    Dispense:  30 tablet    Refill:  0      Procedures: No procedures performed   Clinical Data: No additional findings.   Subjective: Chief Complaint  Patient presents with  . Right Knee - Follow-up    08/21/2019 Right TKA  The patient is now 13 months status post a right total knee arthroplasty.  She still has problems with chronic pain with that knee replacement.  She is 74 years old.  She is requiring Tylenol 3 to help with her pain.  She has been now switched to Lyrica from her primary care physician to help with other nerve type of pain issues.  She does working with her insurance company to get this  approved.  She has been on gabapentin before.  She still reports severe pain with her right knee.  She has had no other acute change from medical status.  HPI  Review of Systems There is currently listed no headache, chest pain, shortness of breath, fever, chills, nausea, vomiting  Objective: Vital Signs: Ht 5\' 2"  (1.575 m)   Wt 135 lb (61.2 kg)   BMI 24.69 kg/m   Physical Exam She is alert and orient x3 and in no acute distress.  She does not walk any significant limp. Ortho Exam Examination of her right operative knee shows no significant effusion.  This can looks good with no evidence of redness or infection.  The the knee joint itself is cool.  It feels ligamentously stable with full range of motion.  It is painful around her leg in general when I touch around the knee and proximal and distal.  There is no evidence of complex regional pain syndrome or RSD. Specialty Comments:  No specialty comments available.  Imaging: XR Knee 1-2 Views Right  Result Date: 09/17/2020 2 views of the right knee show a well-seated total knee arthroplasty with no complicating features.  PMFS History: Patient Active Problem List   Diagnosis Date Noted  . Status post total right knee replacement 08/21/2019  . Adhesive capsulitis of right shoulder 09/08/2018  . Paroxysmal SVT (supraventricular tachycardia) ([place]) 09/08/2018  . Osteoarthritis of right knee 05/27/2016  . COPD (chronic obstructive pulmonary disease) ([place]) 03/02/2016  . Essential familial hypercholesterolemia 03/02/2016  . IBS (irritable bowel syndrome) 03/02/2016  . Migraine headache 03/02/2016  . Chronic fatigue syndrome 03/02/2016  . Vitamin D deficiency 01/15/2015  . Chronic back pain 02/13/2014  . PVD (peripheral vascular disease) ([place]) 08/16/2012  . Menopause syndrome 02/28/2012  . Chronic pain 07/27/2011  . Essential hypertension 12/07/2010  . Insomnia 09/07/2010  . Hypothyroidism 07/06/2010  . GAD (generalized  anxiety disorder) 07/06/2010  . Fibromyalgia 07/06/2010  . Osteoporosis 07/06/2010   Past Medical History:  Diagnosis Date  . Anemia   . Anxiety   . Arrhythmia     AVNRT  . Asthma   . COPD (chronic obstructive pulmonary disease) ([place])   . Depression   . Family history of adverse reaction to anesthesia    " My yougest daughter gets real sick "  . Fibromyalgia   . Hyperlipidemia   . Hypertension   . Hypothyroidism   . Migraine   . PONV (postoperative nausea and vomiting)     Family History  Problem Relation Age of Onset  . Colon cancer Sister 56  . Diabetes Mother   . Stroke Mother   . Dementia Mother   . Skin cancer Father   . Stroke Sister   . Breast cancer Paternal Aunt   . Stomach cancer Paternal Aunt     Past Surgical History:  Procedure Laterality Date  . ABDOMINAL HYSTERECTOMY    . APPENDECTOMY    . CARDIAC ELECTROPHYSIOLOGY MAPPING AND ABLATION  08/28/2010   EPS/RFA of AVNRT  - Dr. Crissie Sickles  . CORONARY STENT PLACEMENT  09/2012  . IR GENERIC HISTORICAL  07/29/2016   IR RADIOLOGY PERIPHERAL GUIDED IV START 07/29/2016 Corrie Mckusick, DO [facility]  . IR GENERIC HISTORICAL  07/29/2016   IR US GUIDE VASC ACCESS RIGHT 07/29/2016 Corrie Mckusick, DO [facility]  . NASAL SINUS SURGERY  1993  . PERCUTANEOUS STENT INTERVENTION Left 10/18/2012   Procedure: PERCUTANEOUS STENT INTERVENTION;  Surgeon: Sherren Mocha, MD;  Location: [facility] CATH LAB;  Service: Cardiovascular;  Laterality: Left;  . TOTAL KNEE ARTHROPLASTY Right 08/21/2019  . TOTAL KNEE ARTHROPLASTY Right 08/21/2019   Procedure: RIGHT TOTAL KNEE ARTHROPLASTY;  Surgeon: Mcarthur Rossetti, MD;  Location: [facility];  Service: Orthopedics;  Laterality: Right;  . UNILATERAL UPPER EXTREMEITY ANGIOGRAM Left 10/18/2012   Procedure: UNILATERAL UPPER EXTREMEITY ANGIOGRAM;  Surgeon: Sherren Mocha, MD;  Location: [facility] CATH LAB;  Service: Cardiovascular;  Laterality: Left;   Social History   Occupational History  .  Occupation: DISABLED    Employer: UNEMPLOYED  Tobacco Use  . Smoking status: Former Smoker    Packs/day: 0.50    Years: 40.00    Pack years: 20.00    Types: Cigarettes    Quit date: 2018    Years since quitting: 3.8  . Smokeless tobacco: Never Used  . Tobacco comment: September 2011, again Oct 2013  Vaping Use  . Vaping Use: Never used  Substance and Sexual Activity  . Alcohol use: No  . Drug use: No  . Sexual activity: Not on file

## 2020-09-18 ENCOUNTER — Ambulatory Visit: Payer: Medicare Other

## 2020-10-08 ENCOUNTER — Ambulatory Visit: Payer: Medicare Other | Admitting: Orthopaedic Surgery

## 2020-10-08 ENCOUNTER — Encounter: Payer: Self-pay | Admitting: Orthopaedic Surgery

## 2020-10-08 ENCOUNTER — Ambulatory Visit: Payer: Self-pay

## 2020-10-08 DIAGNOSIS — M25471 Effusion, right ankle: Secondary | ICD-10-CM | POA: Diagnosis not present

## 2020-10-08 DIAGNOSIS — M25571 Pain in right ankle and joints of right foot: Secondary | ICD-10-CM

## 2020-10-08 MED ORDER — ACETAMINOPHEN-CODEINE #3 300-30 MG PO TABS
1.0000 | ORAL_TABLET | Freq: Three times a day (TID) | ORAL | 0 refills | Status: DC | PRN
Start: 2020-10-08 — End: 2020-10-27

## 2020-10-08 MED ORDER — METHYLPREDNISOLONE 4 MG PO TABS: ORAL_TABLET | ORAL | 0 refills | Status: DC

## 2020-10-08 NOTE — Progress Notes (Signed)
Office Visit Note   Patient: Joann Ray           Date of Birth: 1946-03-30           MRN: [id number] Visit Date: 10/08/2020              Requested by: Christain Sacramento, MD [place],  [place] [address] PCP: Christain Sacramento, MD   Assessment & Plan: Visit Diagnoses:  1. Right ankle swelling   2. Pain in right ankle and joints of right foot     Plan: Certainly the plain films do look quite odd on the lateral view of her foot and ankle.  She is very comfortable on my exam other than just some mild pain.  I would like to place her in a cam walking boot today and will reevaluate her ankle and foot in 2 weeks from now.  We will then likely consider a CT scan versus a MRI.  Hopefully this is an acute on chronic issue that is flared up.  We will send in a steroid taper and some more Tylenol 3 and will reevaluate her in 2 weeks.  All questions and concerns were answered and addressed.  Follow-Up Instructions: Return in about 2 weeks (around 10/22/2020).   Orders:  Orders Placed This Encounter  Procedures  . XR Ankle Complete Right   Meds ordered this encounter  Medications  . methylPREDNISolone (MEDROL) 4 MG tablet    Sig: Medrol dose pack. Take as instructed    Dispense:  21 tablet    Refill:  0  . acetaminophen-codeine (TYLENOL #3) 300-30 MG tablet    Sig: Take 1-2 tablets by mouth every 8 (eight) hours as needed for moderate pain.    Dispense:  30 tablet    Refill:  0      Procedures: No procedures performed   Clinical Data: No additional findings.   Subjective: Chief Complaint  Patient presents with  . Right Foot - Edema  The patient comes in today with right ankle pain and foot swelling as well as ankle swelling.  Is been hurting for weightbearing as well.  She does not remember any type of injury at all to this ankle or ever having injured it.  She is 74 years old.  She is a regular patient of mine who we replaced her right knee.  She is not  a diabetic.  She denies any acute change in her medical status.  HPI  Review of Systems She currently denies any headache, chest pain, shortness of breath, fever, chills, nausea, vomiting  Objective: Vital Signs: There were no vitals taken for this visit.  Physical Exam She is alert and orient x3 and in no acute distress Ortho Exam Examination of her right ankle shows is clinically well located.  There is global swelling but no bruising.  She can flex and extend her ankle and her hindfoot is stable but definitely has an odd appearance.  The foot is well-perfused.  There is no open wounds. Specialty Comments:  No specialty comments available.  Imaging: XR Ankle Complete Right  Result Date: 10/08/2020 3 views of the right ankle show no.  Fracture.  There is soft tissue swelling.  The ankle mortise is congruent on the AP and mortise views.  The lateral view does not show the tibiotalar joint well or the subtalar joint well there is some mild midfoot arthritic changes.    PMFS History: Patient Active Problem List  Diagnosis Date Noted  . Status post total right knee replacement 08/21/2019  . Adhesive capsulitis of right shoulder 09/08/2018  . Paroxysmal SVT (supraventricular tachycardia) ([place]) 09/08/2018  . Osteoarthritis of right knee 05/27/2016  . COPD (chronic obstructive pulmonary disease) ([place]) 03/02/2016  . Essential familial hypercholesterolemia 03/02/2016  . IBS (irritable bowel syndrome) 03/02/2016  . Migraine headache 03/02/2016  . Chronic fatigue syndrome 03/02/2016  . Vitamin D deficiency 01/15/2015  . Chronic back pain 02/13/2014  . PVD (peripheral vascular disease) ([place]) 08/16/2012  . Menopause syndrome 02/28/2012  . Chronic pain 07/27/2011  . Essential hypertension 12/07/2010  . Insomnia 09/07/2010  . Hypothyroidism 07/06/2010  . GAD (generalized anxiety disorder) 07/06/2010  . Fibromyalgia 07/06/2010  . Osteoporosis 07/06/2010   Past Medical History:    Diagnosis Date  . Anemia   . Anxiety   . Arrhythmia     AVNRT  . Asthma   . COPD (chronic obstructive pulmonary disease) ([place])   . Depression   . Family history of adverse reaction to anesthesia    " My yougest daughter gets real sick "  . Fibromyalgia   . Hyperlipidemia   . Hypertension   . Hypothyroidism   . Migraine   . PONV (postoperative nausea and vomiting)     Family History  Problem Relation Age of Onset  . Colon cancer Sister 71  . Diabetes Mother   . Stroke Mother   . Dementia Mother   . Skin cancer Father   . Stroke Sister   . Breast cancer Paternal Aunt   . Stomach cancer Paternal Aunt     Past Surgical History:  Procedure Laterality Date  . ABDOMINAL HYSTERECTOMY    . APPENDECTOMY    . CARDIAC ELECTROPHYSIOLOGY MAPPING AND ABLATION  08/28/2010   EPS/RFA of AVNRT  - Dr. Crissie Sickles  . CORONARY STENT PLACEMENT  09/2012  . IR GENERIC HISTORICAL  07/29/2016   IR RADIOLOGY PERIPHERAL GUIDED IV START 07/29/2016 Corrie Mckusick, DO [facility]  . IR GENERIC HISTORICAL  07/29/2016   IR US GUIDE VASC ACCESS RIGHT 07/29/2016 Corrie Mckusick, DO [facility]  . NASAL SINUS SURGERY  1993  . PERCUTANEOUS STENT INTERVENTION Left 10/18/2012   Procedure: PERCUTANEOUS STENT INTERVENTION;  Surgeon: Sherren Mocha, MD;  Location: [facility] CATH LAB;  Service: Cardiovascular;  Laterality: Left;  . TOTAL KNEE ARTHROPLASTY Right 08/21/2019  . TOTAL KNEE ARTHROPLASTY Right 08/21/2019   Procedure: RIGHT TOTAL KNEE ARTHROPLASTY;  Surgeon: Mcarthur Rossetti, MD;  Location: [facility];  Service: Orthopedics;  Laterality: Right;  . UNILATERAL UPPER EXTREMEITY ANGIOGRAM Left 10/18/2012   Procedure: UNILATERAL UPPER EXTREMEITY ANGIOGRAM;  Surgeon: Sherren Mocha, MD;  Location: [facility] CATH LAB;  Service: Cardiovascular;  Laterality: Left;   Social History   Occupational History  . Occupation: DISABLED    Employer: UNEMPLOYED  Tobacco Use  . Smoking status: Former Smoker    Packs/day: 0.50     Years: 40.00    Pack years: 20.00    Types: Cigarettes    Quit date: 2018    Years since quitting: 3.8  . Smokeless tobacco: Never Used  . Tobacco comment: September 2011, again Oct 2013  Vaping Use  . Vaping Use: Never used  Substance and Sexual Activity  . Alcohol use: No  . Drug use: No  . Sexual activity: Not on file

## 2020-10-17 ENCOUNTER — Ambulatory Visit: Payer: Medicare Other

## 2020-10-22 ENCOUNTER — Ambulatory Visit: Payer: Medicare Other | Admitting: Orthopaedic Surgery

## 2020-10-27 ENCOUNTER — Other Ambulatory Visit: Payer: Self-pay | Admitting: Orthopaedic Surgery

## 2020-10-27 ENCOUNTER — Telehealth: Payer: Self-pay | Admitting: Orthopaedic Surgery

## 2020-10-27 MED ORDER — ACETAMINOPHEN-CODEINE #3 300-30 MG PO TABS
1.0000 | ORAL_TABLET | Freq: Three times a day (TID) | ORAL | 0 refills | Status: DC | PRN
Start: 2020-10-27 — End: 2020-11-10

## 2020-10-27 NOTE — Telephone Encounter (Signed)
Patient called requesting an update about medication being called in. Patient phone number is [phone].

## 2020-10-27 NOTE — Telephone Encounter (Signed)
Patient called requesting a refill of tylenol 3. Please send to pharmacy on file. Patient phone number is [phone].

## 2020-10-29 ENCOUNTER — Ambulatory Visit: Payer: Medicare Other | Admitting: Orthopaedic Surgery

## 2020-11-10 ENCOUNTER — Encounter: Payer: Self-pay | Admitting: Orthopaedic Surgery

## 2020-11-10 ENCOUNTER — Ambulatory Visit: Payer: Medicare Other | Admitting: Orthopaedic Surgery

## 2020-11-10 DIAGNOSIS — M25471 Effusion, right ankle: Secondary | ICD-10-CM

## 2020-11-10 DIAGNOSIS — Z96651 Presence of right artificial knee joint: Secondary | ICD-10-CM | POA: Diagnosis not present

## 2020-11-10 DIAGNOSIS — M25571 Pain in right ankle and joints of right foot: Secondary | ICD-10-CM | POA: Diagnosis not present

## 2020-11-10 MED ORDER — ACETAMINOPHEN-CODEINE #3 300-30 MG PO TABS
1.0000 | ORAL_TABLET | Freq: Three times a day (TID) | ORAL | 0 refills | Status: DC | PRN
Start: 2020-11-10 — End: 2020-12-10

## 2020-11-10 NOTE — Progress Notes (Signed)
The patient is being seen for follow-up as it relates to right foot and ankle swelling and pain.  She is 74 years old.  She has been dealing with fibromyalgia as well.  I did replace her right knee remotely.  She still gets a lot of burning pain.  She is on Neurontin twice daily chronically.  She states that after the cam walking boot the swelling has gone down significantly.  She is inquiring about outpatient physical therapy.  She still reports burning around that right knee.  Examination of her right knee is entirely normal today.  She has full range of motion of her knee prosthesis with a well-healed incision.  There is no warmth to it there is no instability on my exam.  Her right ankle and foot swelling has dissipated.  She has good range of motion of the ankle and is ligamentously stable.  I gave her a generic prescription for outpatient physical therapy to work on any modalities that can strengthen her right lower extremity and decrease her pain and desensitize the leg.  She is requesting more narcotic pain medication and I counseled her about this and stated that I would refill it only 1 more time with Tylenol 3.  She needs to be off these medications and I counseled her about that as well.  All question concerns were answered and addressed.  We will see her back in about 2 months after course of physical therapy.

## 2020-12-10 ENCOUNTER — Telehealth: Payer: Self-pay

## 2020-12-10 ENCOUNTER — Other Ambulatory Visit: Payer: Self-pay | Admitting: Orthopaedic Surgery

## 2020-12-10 MED ORDER — ACETAMINOPHEN-CODEINE #3 300-30 MG PO TABS: 1.0000 | ORAL_TABLET | Freq: Three times a day (TID) | ORAL | 0 refills | Status: DC | PRN

## 2020-12-10 NOTE — Telephone Encounter (Signed)
Patient called she is requesting rx for tylenol 3 she [facility] [place] [address]  [address] [place] [address]  [phone]

## 2020-12-25 ENCOUNTER — Telehealth: Payer: Self-pay | Admitting: Orthopaedic Surgery

## 2020-12-25 MED ORDER — ACETAMINOPHEN-CODEINE #3 300-30 MG PO TABS: 1.0000 | ORAL_TABLET | Freq: Two times a day (BID) | ORAL | 0 refills | Status: DC | PRN

## 2020-12-25 NOTE — Telephone Encounter (Signed)
Please advise 

## 2020-12-25 NOTE — Telephone Encounter (Signed)
Patient called needing Rx refilled Tylenol 3. The number to contact patient is [phone] °

## 2021-01-12 ENCOUNTER — Ambulatory Visit: Payer: Medicare Other | Admitting: Orthopaedic Surgery

## 2021-01-12 ENCOUNTER — Encounter: Payer: Self-pay | Admitting: Orthopaedic Surgery

## 2021-01-12 DIAGNOSIS — M25561 Pain in right knee: Secondary | ICD-10-CM | POA: Diagnosis not present

## 2021-01-12 DIAGNOSIS — Z96651 Presence of right artificial knee joint: Secondary | ICD-10-CM

## 2021-01-12 DIAGNOSIS — G8929 Other chronic pain: Secondary | ICD-10-CM

## 2021-01-12 MED ORDER — ACETAMINOPHEN-CODEINE #3 300-30 MG PO TABS: 1.0000 | ORAL_TABLET | Freq: Two times a day (BID) | ORAL | 0 refills | Status: DC | PRN

## 2021-01-12 NOTE — Progress Notes (Signed)
The patient is a 75 year old female well-known to me.  We replaced her right knee in September 2020.  That has been 17 months now.  She still reports chronic right knee pain.  Examination of her right knee she has no swelling or effusion.  Her knee range of motion is full.  The knee is ligamentously stable.  Previous x-rays also show well-seated implant with no complicating features.  I also saw her recently for her right ankle.  That swelling has dissipated significantly.  She is requesting Tylenol 3.  I counseled her extensively about getting off of this medication.  I told her I would provide this just 1 more time only.  We will at least order a three-phase bone scan to rule out any prosthetic loosening of the right knee replacement.  We will see her back in follow-up after this three-phase bone scan is performed.

## 2021-01-13 ENCOUNTER — Other Ambulatory Visit: Payer: Self-pay

## 2021-01-13 DIAGNOSIS — G8929 Other chronic pain: Secondary | ICD-10-CM

## 2021-01-23 ENCOUNTER — Telehealth: Payer: Self-pay | Admitting: Orthopaedic Surgery

## 2021-01-23 MED ORDER — ACETAMINOPHEN-CODEINE #3 300-30 MG PO TABS: 1.0000 | ORAL_TABLET | Freq: Two times a day (BID) | ORAL | 0 refills | Status: DC | PRN

## 2021-01-23 NOTE — Telephone Encounter (Signed)
I sent it in just this one last time.  She needs to know that this is the last time I can prescribe this medication.

## 2021-01-23 NOTE — Telephone Encounter (Signed)
I called pt and let her know. She stated understanding

## 2021-01-23 NOTE — Telephone Encounter (Signed)
Patient called needing Rx refilled Tylenol 3. The number to contact patient is [phone] °

## 2021-01-26 ENCOUNTER — Ambulatory Visit: Payer: Medicare Other | Admitting: Orthopaedic Surgery

## 2021-02-04 ENCOUNTER — Ambulatory Visit: Payer: Medicare Other | Admitting: Orthopaedic Surgery

## 2021-02-23 ENCOUNTER — Telehealth: Payer: Self-pay

## 2021-02-23 ENCOUNTER — Telehealth: Payer: Self-pay | Admitting: Orthopaedic Surgery

## 2021-02-23 NOTE — Telephone Encounter (Signed)
I told her at her last visit that I cannot refill this anymore.

## 2021-02-23 NOTE — Telephone Encounter (Signed)
Patient requests refill of Tylenol#3

## 2021-02-23 NOTE — Telephone Encounter (Signed)
Can we check the status on this?

## 2021-02-23 NOTE — Telephone Encounter (Signed)
Patient called about refill of Tylenol 3. I explained to her that no more refill will be given for Tylenol 3. Patient understood.

## 2021-02-23 NOTE — Telephone Encounter (Signed)
Patient called she is requesting rx refill for tylenol 3 she also sated she hasnt received a call back from [place] regarding completing a bone scan for her knee call [phone]

## 2021-02-23 NOTE — Telephone Encounter (Signed)
Pt called back and was informed of this

## 2021-02-24 NOTE — Telephone Encounter (Signed)
Per April P with scheduling, they have contacted pt on 02/24 and 3/2 with no return call. Scheduling will call again.

## 2021-02-24 NOTE — Telephone Encounter (Signed)
Sent email to April P with scheduling, someone will call pt to get her scehduled

## 2021-02-25 NOTE — Telephone Encounter (Signed)
Called pt and advised her [facility] scheduling has been trying to schedule appt, pt states she has number and will call them to schedule

## 2021-03-12 ENCOUNTER — Ambulatory Visit: Payer: Medicare Other | Admitting: Orthopaedic Surgery

## 2021-03-23 ENCOUNTER — Telehealth: Payer: Self-pay | Admitting: Orthopaedic Surgery

## 2021-03-23 NOTE — Telephone Encounter (Signed)
Patient called needing Rx refilled Tylenol 3. The  Number to contact patient is [phone]

## 2021-03-23 NOTE — Telephone Encounter (Signed)
Regular tylenol and Aleve

## 2021-03-23 NOTE — Telephone Encounter (Signed)
LMOM for patient of the below message  

## 2021-03-23 NOTE — Telephone Encounter (Signed)
Patient called and I read note to patient from Dr. Ninfa Linden concerning Rx for Tylenol 3. Patient asked if there is anything she can take for the pain other than Tylenol 3?  The number to contact patient is [phone]

## 2021-03-23 NOTE — Telephone Encounter (Signed)
I can't provide this any more.

## 2021-04-15 ENCOUNTER — Encounter (HOSPITAL_COMMUNITY): Payer: Medicare Other

## 2021-04-22 ENCOUNTER — Other Ambulatory Visit: Payer: Self-pay

## 2021-04-22 ENCOUNTER — Ambulatory Visit (HOSPITAL_COMMUNITY)
Admission: RE | Admit: 2021-04-22 | Discharge: 2021-04-22 | Disposition: A | Discharge status: Home / Self Care | Payer: Medicare Other | Source: Ambulatory Visit | Attending: Orthopaedic Surgery | Admitting: Orthopaedic Surgery

## 2021-04-22 ENCOUNTER — Encounter (HOSPITAL_COMMUNITY)
Admission: RE | Admit: 2021-04-22 | Discharge: 2021-04-22 | Disposition: A | Discharge status: Home / Self Care | Payer: Medicare Other | Source: Ambulatory Visit | Attending: Orthopaedic Surgery | Admitting: Orthopaedic Surgery

## 2021-04-22 DIAGNOSIS — G8929 Other chronic pain: Secondary | ICD-10-CM | POA: Insufficient documentation

## 2021-04-22 DIAGNOSIS — M25561 Pain in right knee: Secondary | ICD-10-CM | POA: Insufficient documentation

## 2021-04-22 MED ORDER — TECHNETIUM TC 99M MEDRONATE IV KIT
20.0000 | PACK | Freq: Once | INTRAVENOUS | Status: AC | PRN
  Administered 2021-04-22: 21.6 via INTRAVENOUS

## 2021-04-23 ENCOUNTER — Encounter: Payer: Self-pay | Admitting: Orthopaedic Surgery

## 2021-04-23 ENCOUNTER — Ambulatory Visit: Payer: Self-pay

## 2021-04-23 ENCOUNTER — Ambulatory Visit: Payer: Medicare Other | Admitting: Orthopaedic Surgery

## 2021-04-23 ENCOUNTER — Other Ambulatory Visit: Payer: Self-pay

## 2021-04-23 DIAGNOSIS — Z96651 Presence of right artificial knee joint: Secondary | ICD-10-CM

## 2021-04-23 DIAGNOSIS — G8929 Other chronic pain: Secondary | ICD-10-CM | POA: Diagnosis not present

## 2021-04-23 DIAGNOSIS — M25561 Pain in right knee: Secondary | ICD-10-CM

## 2021-04-23 NOTE — Progress Notes (Signed)
The patient is following up for her right total knee arthroplasty but we did in 2020.  He continues to hurt her quite a bit.  She says it swells on occasion.  I did send her for a three-phase bone scan to rule out prosthetic loosening.  He was not really specific.  There may be a touch of synovitis in the knee but there was no gross loosening of the components.  New x-rays today of the right total knee arthroplasty show a well-seated implant with no complicating features.  Examination of the right knee shows full and fluid range of motion.  He does not really feel unstable to me and I was able to aspirate only 5 cc of clear yellow fluid from the knee.  I will send this at least all for cell count.  I would like to at least try a short course of physical therapy to work on strengthening her quads and her knee as well as working on her gait and her balance and coordination.  I have also recommended a copper fit knee sleeve for that knee just for some compression.  She agrees with this treatment plan.  I talked to her daughter who is a Designer, jewellery and she also agrees with that.  This would be a last step before considering an intervention with that knee such as an exploration with synovectomy and probably exchange.  All question concerns were answered and addressed.  After we set up physical therapy up and she has a course of that I will see her back in 4 weeks.

## 2021-04-25 LAB — SPECIMEN STATUS REPORT

## 2021-04-28 LAB — SYNOVIAL FLUID, CELL COUNT
Eos, Fluid: 0 %
Lining Cells, Synovial: 2 %
Lymphs, Fluid: 60 %
Macrophages Fld: 36 %
Nuc cell # Fld: 474 cells/uL — ABNORMAL HIGH (ref 0–200)
Polys, Fluid: 2 %
RBC, Fluid: 2000 /uL

## 2021-05-04 ENCOUNTER — Encounter: Payer: Self-pay | Admitting: Gastroenterology

## 2021-05-06 ENCOUNTER — Other Ambulatory Visit: Payer: Self-pay

## 2021-05-07 ENCOUNTER — Ambulatory Visit (INDEPENDENT_AMBULATORY_CARE_PROVIDER_SITE_OTHER): Payer: Medicare Other | Admitting: Nurse Practitioner

## 2021-05-07 ENCOUNTER — Encounter: Payer: Self-pay | Admitting: Nurse Practitioner

## 2021-05-07 VITALS — BP 112/62 | HR 90 | Ht 62.0 in | Wt 138.0 lb

## 2021-05-07 DIAGNOSIS — K219 Gastro-esophageal reflux disease without esophagitis: Secondary | ICD-10-CM | POA: Diagnosis not present

## 2021-05-07 DIAGNOSIS — K644 Residual hemorrhoidal skin tags: Secondary | ICD-10-CM

## 2021-05-07 DIAGNOSIS — D649 Anemia, unspecified: Secondary | ICD-10-CM

## 2021-05-07 DIAGNOSIS — K625 Hemorrhage of anus and rectum: Secondary | ICD-10-CM

## 2021-05-07 MED ORDER — SUPREP BOWEL PREP KIT 17.5-3.13-1.6 GM/177ML PO SOLN
1.0000 | ORAL | 0 refills | Status: DC
Start: 2021-05-07 — End: 2021-05-27

## 2021-05-07 NOTE — Patient Instructions (Addendum)
LABS:  Lab work has been ordered for you today. Our lab is located in the basement. Press "B" on the elevator. The lab is located at the first door on the left as you exit the elevator.  PROCEDURES: You have been scheduled for a colonoscopy. Please follow the written instructions given to you at your visit today. Please pick up your prep supplies at the pharmacy within the next 1-3 days. If you use inhalers (even only as needed), please bring them with you on the day of your procedure.   RECOMMENDATIONS: Famotidine 40 MG tablet, take 1 twice a day (over the counter) Benefiber- 1 tablespoon daily to avoid straining. Desitin: Apply a small amount to the external anal area three times a day as needed.  Please call our office if your symptoms worsen. It was great seeing you today! Thank you for entrusting me with your care and choosing [facility].  Noralyn Pick, CRNP  The [facility] providers would like to encourage you to use [facility] to communicate with providers for non-urgent requests or questions.  Due to long hold times on the telephone, sending your provider a message by [facility] may be faster and more efficient way to get a response. Please allow 48 business hours for a response.  Please remember that this is for non-urgent requests/questions.  If you are age 29 or older, your body mass index should be between 23-30. Your Body mass index is 25.24 kg/m. If this is out of the aforementioned range listed, please consider follow up with your Primary Care Provider.  If you are age 25 or younger, your body mass index should be between 19-25. Your Body mass index is 25.24 kg/m. If this is out of the aformentioned range listed, please consider follow up with your Primary Care Provider.

## 2021-05-07 NOTE — Progress Notes (Signed)
05/07/2021 ADIAH GUERECA [id number] 06-Dec-1945   CHIEF COMPLAINT: Rectal bleeding   HISTORY OF PRESENT ILLNESS: Danton Sewer. 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  Past appendectomy, hysterectomy, right total knee arthroplasty and sinus surgery.    She presents to our office today for further evaluation regarding rectal bleeding. She reports passing a small amount of bright red blood on the toilet tissue and in the toilet water which is separate from the stool which occurs on and off for a day or two every few weeks over the past year. She sometimes passes blood from the rectum without passing any stool. Last week, she noticed more rectal bleeding. Tuesday 04/30/2021 she was standing on her feet for an extended period of time while she attended her sister in [place] and the next day she passed a moderate amount of red blood in the toilet bowl. She last saw a smaller amount of blood on the toilet tissue 2 days ago. No blood noticed from the rectum today. She has intermittent anal discomfort. She underwent a colonoscopy by Dr. Henrene Pastor 05/07/2011 which identified a diminutive tubular adenomatous polyp which was removed from the descending colon, mild diverticulosis in the sigmoid colon.  Colon biopsies showed evidence of melanosis coli.  A repeat colonoscopy was recommended in 5 years. Sister with colorectal cancer diagnosed age 46 died age 60 and her brother recently died at the age 3 from colon cancer. She has a history of GERD. She stopped taking Pantoprazole 3 months ago because it  caused SOB. She is taking Famotidine 88m QD. She is having daily heartburn since she stopped taking the Pantoprazole. No dysphagia. She reported having chronic upper and lower  abdominal pain, no new pain. She stated she doesn't pay much attention to her abdominal pains which she attributed to old age.  She stated one of her daughters has a gluten sensitivity but has not been diagnosed with celiac disease.She is currently taking Cipro for a urinary tract infection.  She denies taking ASA as listed on her medication profile. No NSAIDS.  No weight loss.  No fever, sweats or chills.  No chest pain or shortness of breath.  No other complaints at this time  Laboratory studies 03/26/2021: WBC 7.1.  Hemoglobin 11.6.  Hematocrit 35.5.  MCV 93.1.  Platelet 203.  Iron 80.  Iron saturation 24.  Ferritin 57.  BUN 11.  Creatinine 1.16.  Sodium 139.  Potassium 5.0.  Alk phos 63.  Total bili 7.4.  AST 25.  ALT 18.    Past Medical History:  Diagnosis Date   Anemia    Anxiety    Arrhythmia     AVNRT   Asthma    COPD (chronic obstructive pulmonary disease) (HCC)    Depression    Family history of adverse reaction to anesthesia    " My yougest daughter gets real sick "   Fibromyalgia    Hyperlipidemia    Hypertension    Hypothyroidism    Migraine    PONV (postoperative nausea and vomiting)    Past Surgical History:  Procedure Laterality Date   ABDOMINAL HYSTERECTOMY     APPENDECTOMY     CARDIAC ELECTROPHYSIOLOGY MAPPING AND ABLATION  08/28/2010   EPS/RFA of AVNRT  - Dr. GCrissie Sickles  CATARACT EXTRACTION Bilateral    CORONARY STENT PLACEMENT  09/2012   IR GENERIC HISTORICAL  07/29/2016   IR RADIOLOGY PERIPHERAL GUIDED IV START 07/29/2016 Corrie Mckusick, DO [facility]   IR GENERIC HISTORICAL  07/29/2016   IR US GUIDE VASC ACCESS RIGHT 07/29/2016 Corrie Mckusick, DO [facility]   NASAL SINUS SURGERY  1993   PERCUTANEOUS STENT INTERVENTION Left 10/18/2012   Procedure: PERCUTANEOUS STENT INTERVENTION;  Surgeon: Sherren Mocha, MD;  [facility] LAB;  Service: Cardiovascular;  Laterality: Left;   TOTAL KNEE ARTHROPLASTY Right 08/21/2019   TOTAL KNEE ARTHROPLASTY Right  08/21/2019   Procedure: RIGHT TOTAL KNEE ARTHROPLASTY;  Surgeon: Mcarthur Rossetti, MD;  Location: [place];  Service: Orthopedics;  Laterality: Right;   UNILATERAL UPPER EXTREMEITY ANGIOGRAM Left 10/18/2012   Procedure: UNILATERAL UPPER Anselmo Rod;  Surgeon: Sherren Mocha, MD;  Location: [facility] CATH LAB;  Service: Cardiovascular;  Laterality: Left;   Social History: She is married. She smoked cigarettes off and on x 20 years, quit 4 years. No alcohol use. No drug use.   Family History: Mother with history of diabetes, stroke and dementia.  Father with history of skin cancer.  Sister with colorectal cancer diagnosed age 53 died age 43. Brother died age 66 from colon cancer.  Paternal aunt with history of stomach cancer.   Allergies  Allergen Reactions   Penicillins Anaphylaxis, Hives and Swelling    Did it involve swelling of the face/tongue/throat, SOB, or low BP? Yes Did it involve sudden or severe rash/hives, skin peeling, or any reaction on the inside of your mouth or nose? No Did you need to seek medical attention at a hospital or doctor's office? Yes When did it last happen?      50 years ago If all above answers are "NO", may proceed with cephalosporin use.   Diazepam Palpitations      Outpatient Encounter Medications as of 05/07/2021  Medication Sig   SUPREP BOWEL PREP KIT 17.5-3.13-1.6 GM/177ML SOLN Take 1 kit by mouth as directed. For colonoscopy prep   acetaminophen-codeine (TYLENOL #3) 300-30 MG tablet Take 1 tablet by mouth 2 (two) times daily as needed.   ADVAIR HFA 115-21 MCG/ACT inhaler Inhale 2 puffs into the lungs 2 (two) times daily.   albuterol (PROAIR HFA) 108 (90 Base) MCG/ACT inhaler INHALE 2 PUFFS INTO THE LUNGS TWICE A DAY (Patient taking differently: Inhale 2 puffs into the lungs every 4 (four) hours as needed for wheezing. INHALE 2 PUFFS INTO THE LUNGS TWICE A DAY)   amLODipine (NORVASC) 2.5 MG tablet Take 1 tablet (2.5 mg total) by mouth daily.    aspirin 81 MG chewable tablet Chew 1 tablet (81 mg total) by mouth 2 (two) times daily.   atenolol (TENORMIN) 25 MG tablet Take one tablet twice a day as needed for rapid heart rate. (Patient taking differently: Take 25 mg by mouth as needed (rapid heart rate). Take one tablet twice a day as needed for rapid heart rate.)   Cholecalciferol (VITAMIN D) 2000 UNITS CAPS Take 1 capsule (2,000 Units total) by mouth daily. (Patient taking differently: Take 1,000 Units by mouth daily. )   ciprofloxacin (CIPRO) 500 MG tablet Take by mouth.   clonazePAM (KLONOPIN) 0.5 MG tablet Take 0.5 mg by mouth 2 (two) times daily as needed.   diclofenac (VOLTAREN) 75 MG EC tablet Take 75 mg by mouth as needed for mild pain (arthritis).    dicyclomine (BENTYL) 10 MG capsule Take 10 mg by mouth  as needed for spasms.    DULoxetine (CYMBALTA) 30 MG capsule Take 30 mg by mouth daily.   famotidine (PEPCID) 40 MG tablet Take 40 mg by mouth daily.   furosemide (LASIX) 20 MG tablet Take 20 mg by mouth as needed for fluid.    gabapentin (NEURONTIN) 300 MG capsule Take 300 mg by mouth 2 (two) times daily.   HYDROcodone-acetaminophen (NORCO/VICODIN) 5-325 MG tablet Take by mouth.   hydrOXYzine (ATARAX/VISTARIL) 50 MG tablet Take 1 tablet (50 mg total) by mouth at bedtime.   levothyroxine (SYNTHROID, LEVOTHROID) 50 MCG tablet Take 0.5 tablets (25 mcg total) by mouth daily before breakfast.   losartan (COZAAR) 100 MG tablet Take 100 mg by mouth daily.    methocarbamol (ROBAXIN) 500 MG tablet Take 1 tablet (500 mg total) by mouth every 6 (six) hours as needed for muscle spasms.   methylPREDNISolone (MEDROL) 4 MG tablet Medrol dose pack. Take as instructed   ondansetron (ZOFRAN) 4 MG tablet TAKE 1 TABLET BY MOUTH EVERY 6 HOURS AS NEEDED FOR NAUSEA AND VOMITING (Patient taking differently: Take 4 mg by mouth every 8 (eight) hours as needed for nausea or vomiting. )   pantoprazole (PROTONIX) 40 MG tablet Take 40 mg by mouth daily.    pregabalin (LYRICA) 150 MG capsule Take 1 capsule (150 mg total) by mouth 2 (two) times daily.   rosuvastatin (CRESTOR) 5 MG tablet Take 1 tablet (5 mg total) by mouth 2 (two) times a week. (Patient taking differently: Take 5 mg by mouth daily at 12 noon. )   sertraline (ZOLOFT) 50 MG tablet Take 50 mg by mouth daily.   traMADol (ULTRAM) 50 MG tablet Take 1 tablet (50 mg total) by mouth every 6 (six) hours as needed.   [DISCONTINUED] celecoxib (CELEBREX) 200 MG capsule Take 1 capsule (200 mg total) by mouth 2 (two) times daily between meals as needed for mild pain. (Patient not taking: Reported on 09/17/2020)   No facility-administered encounter medications on file as of 05/07/2021.    REVIEW OF SYSTEMS:  Gen: + Fatigue. Denies fever, sweats or chills. No weight loss.  CV: + Leg swelling. Denies chest pain or palpitations.  Resp: Denies cough, shortness of breath of hemoptysis.  GI: Denies heartburn, dysphagia, stomach or lower abdominal pain. No diarrhea or constipation.  GU : + Urine leakage. Current UTI.  MS: + Arthritis and back pain. + Muscle crams. Right knee pain.  Derm: Denies rash, itchiness, skin lesions or unhealing ulcers. Psych: + Anxiety and depression.  Heme: Denies bruising, bleeding. Neuro:  + Headaches. No dizziness or paresthesias. Endo:  Denies any problems with DM, thyroid or adrenal function.  PHYSICAL EXAM: BP 112/62   Pulse 90   Ht _0  (1.575 m)   Wt 138 lb (62.6 kg)   BMI 25.24 kg/m  Wt Readings from Last 3 Encounters:  05/07/21 138 lb (62.6 kg)  09/17/20 135 lb (61.2 kg)  05/16/20 139 lb (63 kg)    General: 75 year old female in no acute distress. Head: Normocephalic and atraumatic. Eyes:  Sclerae non-icteric, conjunctive pink. Ears: Normal auditory acuity. Mouth: Upper dentures/lower dentures/implant. No ulcers or lesions.  Neck: Supple, no lymphadenopathy or thyromegaly.  Lungs: Clear bilaterally to auscultation without wheezes, crackles or  rhonchi. Heart: Regular rate and rhythm. No murmur, rub or gallop appreciated.  Abdomen: Soft, nondistended. Mild generalized tenderness without rebound or guarding. No masses. No hepatosplenomegaly. Normoactive bowel sounds x 4 quadrants.  Rectal: Small non-inflamed external hemorrhoid.  No significant internal hemorrhoids. Anoscopy utilized, resulted in stimulating bowel activity, moderate amount of normal brown stool in the rectal vault obscuring view. No blood in the rectal vault.  Musculoskeletal: Symmetrical with no gross deformities. Skin: Warm and dry. No rash or lesions on visible extremities. Extremities: No edema. Neurological: Alert oriented x 4, no focal deficits.  Psychological:  Alert and cooperative. Normal mood and affect.  ASSESSMENT AND PLAN:  76 year old female with a rectal bleeding.  Noninflamed external hemorrhoids on exam. -CBC -Colonoscopy benefits and risks discussed including risk with sedation, risk of bleeding, perforation and infection  -Apply a small amount of Desitin inside the anal opening and to the external anal area tid as needed for anal or hemorrhoidal irritation/bleeding.  -Benefiber 1 tablespoon once daily as tolerated -Patient to call our office if her rectal bleeding recurs/worsens  2. History of a tubular adenomatous colon polyp removed at the time of a colonoscopy in 2012. Sister and brother with history of colon cancer.  -Colonoscopy as ordered above  3. GERD, increased heartburn since stopping Pantoprazole 3 to 4 months ago.  -Increase Famotidine 57m bid  -Consider EGD if symptoms persist or worsen   4. Chronic normocytic anemia  -CBC  Further recommendations to be determined after the above evaluation completed      CC:  Lazoff, Shawn P, DO

## 2021-05-08 ENCOUNTER — Ambulatory Visit: Payer: Medicare Other | Admitting: Physical Therapy

## 2021-05-08 NOTE — Progress Notes (Signed)
Noted  

## 2021-05-12 ENCOUNTER — Ambulatory Visit: Payer: Medicare Other | Admitting: Rehabilitative and Restorative Service Providers"

## 2021-05-20 ENCOUNTER — Ambulatory Visit: Payer: Medicare Other | Admitting: Orthopaedic Surgery

## 2021-05-20 ENCOUNTER — Encounter: Payer: Self-pay | Admitting: Internal Medicine

## 2021-05-21 ENCOUNTER — Ambulatory Visit: Payer: Medicare Other | Admitting: Rehabilitative and Restorative Service Providers"

## 2021-05-22 ENCOUNTER — Ambulatory Visit: Payer: Medicare Other | Admitting: Rehabilitative and Restorative Service Providers"

## 2021-05-22 ENCOUNTER — Other Ambulatory Visit: Payer: Self-pay

## 2021-05-22 ENCOUNTER — Encounter: Payer: Self-pay | Admitting: Rehabilitative and Restorative Service Providers"

## 2021-05-22 DIAGNOSIS — R600 Localized edema: Secondary | ICD-10-CM | POA: Diagnosis not present

## 2021-05-22 DIAGNOSIS — M25661 Stiffness of right knee, not elsewhere classified: Secondary | ICD-10-CM

## 2021-05-22 DIAGNOSIS — M25561 Pain in right knee: Secondary | ICD-10-CM

## 2021-05-22 DIAGNOSIS — R262 Difficulty in walking, not elsewhere classified: Secondary | ICD-10-CM | POA: Diagnosis not present

## 2021-05-22 DIAGNOSIS — M6281 Muscle weakness (generalized): Secondary | ICD-10-CM

## 2021-05-22 DIAGNOSIS — G8929 Other chronic pain: Secondary | ICD-10-CM

## 2021-05-22 NOTE — Therapy (Addendum)
[facility] [address] [place], [place], [address] Phone: [phone]   Fax:  [phone]  Physical Therapy Evaluation/ Discharge   Patient Details  Name: Joann Ray MRN: [id number] Date of Birth: July 02, 1946 Referring Provider (PT): Mcarthur Rossetti MD   Encounter Date: 05/22/2021   PT End of Session - 05/22/21 1130     Visit Number 1    Number of Visits 12    PT Start Time 0840    PT Stop Time 0930    PT Time Calculation (min) 50 min    Activity Tolerance Patient tolerated treatment well;Patient limited by fatigue;No increased pain    Behavior During Therapy WFL for tasks assessed/performed             Past Medical History:  Diagnosis Date   Anemia    Anxiety    Arrhythmia     AVNRT   Asthma    COPD (chronic obstructive pulmonary disease) ([place])    Depression    Family history of adverse reaction to anesthesia    " My yougest daughter gets real sick "   Fibromyalgia    Hyperlipidemia    Hypertension    Hypothyroidism    Migraine    PONV (postoperative nausea and vomiting)     Past Surgical History:  Procedure Laterality Date   ABDOMINAL HYSTERECTOMY     APPENDECTOMY     CARDIAC ELECTROPHYSIOLOGY MAPPING AND ABLATION  08/28/2010   EPS/RFA of AVNRT  - Dr. Crissie Sickles   CATARACT EXTRACTION Bilateral    CORONARY STENT PLACEMENT  09/2012   IR GENERIC HISTORICAL  07/29/2016   IR RADIOLOGY PERIPHERAL GUIDED IV START 07/29/2016 Corrie Mckusick, DO [facility]   IR GENERIC HISTORICAL  07/29/2016   IR US GUIDE VASC ACCESS RIGHT 07/29/2016 Corrie Mckusick, DO [facility]   NASAL SINUS SURGERY  1993   PERCUTANEOUS STENT INTERVENTION Left 10/18/2012   Procedure: PERCUTANEOUS STENT INTERVENTION;  Surgeon: Sherren Mocha, MD;  Location: [facility] CATH LAB;  Service: Cardiovascular;  Laterality: Left;   TOTAL KNEE ARTHROPLASTY Right 08/21/2019   TOTAL KNEE ARTHROPLASTY Right 08/21/2019   Procedure: RIGHT TOTAL KNEE  ARTHROPLASTY;  Surgeon: Mcarthur Rossetti, MD;  Location: [facility];  Service: Orthopedics;  Laterality: Right;   UNILATERAL UPPER EXTREMEITY ANGIOGRAM Left 10/18/2012   Procedure: UNILATERAL UPPER Anselmo Rod;  Surgeon: Sherren Mocha, MD;  Location: [facility] CATH LAB;  Service: Cardiovascular;  Laterality: Left;    There were no vitals filed for this visit.    Subjective Assessment - 05/22/21 1125     Subjective Pat had a R TKA in September of 2020.  She reports her knee "gives out" and she has fallen twice when her knee gave way.  She would like to be able to stand and walk better without fear of her knee giving way.    Pertinent History HTN, PVD, hyperlipidemia, fibromyalgia, COPD, hypothyroidism, chronic fatigue, osteoporosis, chronic back pain    Limitations House hold activities;Lifting;Standing;Walking    How long can you sit comfortably? Start-up stiffness    How long can you stand comfortably? 10 minutes    How long can you walk comfortably? 5-10 minutes    Patient Stated Goals Stand and walk without knee giving way    Currently in Pain? Yes    Pain Score 5     Pain Location Knee    Pain Orientation Right    Pain Descriptors / Indicators Aching;Sore    Pain Type Chronic pain  Pain Radiating Towards NA    Pain Onset More than a month ago    Pain Frequency Constant    Aggravating Factors  Too much WB and prolonged postures    Pain Relieving Factors Rest and change of position    Effect of Pain on Daily Activities Is apprehensive about standing and walking more than 5-10 minutes for fear of her R knee giving out and falling    Multiple Pain Sites No                [facility] PT Assessment - 05/22/21 0001       Assessment   Medical Diagnosis Chronic R knee pain    Referring Provider (PT) Mcarthur Rossetti MD    Onset Date/Surgical Date --   Chronic with previous R TKA 07/2019   Next MD Visit Late July?      Precautions   Precautions None      Restrictions    Weight Bearing Restrictions No      Balance Screen   Has the patient fallen in the past 6 months Yes    How many times? 2    Has the patient had a decrease in activity level because of a fear of falling?  Yes    Is the patient reluctant to leave their home because of a fear of falling?  Yes      [place] residence    Additional Comments 4 steps she has to do and she is a side stepper      Prior Function   Level of Independence Independent with basic ADLs    Vocation Retired    Programmer, multimedia work      Cognition   Overall Cognitive Status Within Functional Limits for tasks assessed      Observation/Other Assessments   Focus on Therapeutic Outcomes (FOTO)  34 (Goal 50)      ROM / Strength   AROM / PROM / Strength AROM;Strength      AROM   Overall AROM  Deficits    AROM Assessment Site Knee    Right/Left Knee Left;Right    Right Knee Extension 0    Right Knee Flexion 108    Left Knee Extension 0    Left Knee Flexion 130      Strength   Overall Strength Deficits    Strength Assessment Site Knee    Right/Left Knee Left;Right    Right Knee Extension 3-/5    Left Knee Extension 4-/5                        Objective measurements completed on examination: See above findings.       [facility] Adult PT Treatment/Exercise - 05/22/21 0001       Therapeutic Activites    Other Therapeutic Activities Reviewed exam findings, discussed expected outcomes with PT, reviewed starter HEP      Exercises   Exercises Knee/Hip      Knee/Hip Exercises: Seated   Knee/Hip Flexion Seated straight leg raises 2 sets of 5 with a 3 second hold and slow eccentrics      Knee/Hip Exercises: Supine   Quad Sets Strengthening;Both;2 sets;10 reps;Limitations    Quad Sets Limitations 5 seconds (toes back, press knee down and tighten the thigh)                    PT Education - 05/22/21 1129  Education Details Reviewed exam findings  and starter HEP, spoke about the anatomy of the knee and expectations with PT    Person(s) Educated Patient    Methods Explanation;Demonstration;Tactile cues;Verbal cues;Handout    Comprehension Verbal cues required;Returned demonstration;Need further instruction;Tactile cues required;Verbalized understanding              PT Short Term Goals - 05/22/21 1138       PT SHORT TERM GOAL #1   Title Joann Ray will report no instability or falls for 2 consecutive weeks.    Time 4    Period Weeks    Status New    Target Date 06/26/21      PT SHORT TERM GOAL #2   Title Joann Ray will be independent with her starter HEP.    Time 4    Period Weeks    Status New    Target Date 06/26/21               PT Long Term Goals - 05/22/21 1139       PT LONG TERM GOAL #1   Title Improve FOTO to 50.    Baseline 34    Time 8    Period Weeks    Status New    Target Date 07/24/21      PT LONG TERM GOAL #2   Title Improve R knee pain to consistently 0-3/10 on the Numeric Pain Rating Scale.    Baseline Can be 5+/10    Time 8    Period Weeks    Status New    Target Date 07/24/21      PT LONG TERM GOAL #3   Title Improve B quadriceps strength as assessed by MMT and functional self-report.    Baseline 3-/5 MMT    Time 8    Period Weeks    Status New    Target Date 07/24/21      PT LONG TERM GOAL #4   Title Joann Ray will be independent with her long-term HEP at DC.    Time 8    Period Weeks    Status New    Target Date 07/24/21                    Plan - 05/22/21 1129     Clinical Impression Statement Joann Ray had her R knee replaced in September of 2020.  She has pain and complaints of instability with her knee giving way on 2 occasions resulting in falls.  She would like to be able to stand and walk without fear of her R knee giving out.  AROM is good and quadriceps strength poor.  Joann Ray will benefit from LE strengthening with a quadriceps emphasis to reduce instability, pain and improve  function.    Personal Factors and Comorbidities Comorbidity 3+    Comorbidities HTN, PVD, hyperlipidemia, fibromyalgia, COPD, hypothyroidism, chronic fatigue, osteoporosis, chronic back pain    Examination-Activity Limitations Sit;Transfers;Sleep;Bend;Lift;Squat;Locomotion Level;Stairs;Stand;Carry    Examination-Participation Restrictions Interpersonal Relationship;Yard Work;Community Activity    Stability/Clinical Decision Making Stable/Uncomplicated    Clinical Decision Making Low    Rehab Potential Good    PT Frequency Other (comment)   1-2X/week   PT Duration 8 weeks    PT Treatment/Interventions ADLs/Self Care Home Management;Electrical Stimulation;Cryotherapy;Therapeutic activities;Stair training;Gait training;Therapeutic exercise;Balance training;Neuromuscular re-education;Patient/family education;Manual techniques;Vasopneumatic Device    PT Next Visit Plan Progress quadriceps strength (leg press, bike, 90-30 knee extension machine with slow eccentrics), balance and stairs    PT Home Exercise Plan Access  Code: [email or web address]    Consulted and Agree with Plan of Care Patient             Patient will benefit from skilled therapeutic intervention in order to improve the following deficits and impairments:  Abnormal gait, Decreased activity tolerance, Decreased endurance, Decreased range of motion, Decreased strength, Difficulty walking, Increased edema, Pain  Visit Diagnosis: Difficulty walking  Muscle weakness (generalized)  Localized edema  Stiffness of right knee, not elsewhere classified  Chronic pain of right knee     Problem List Patient Active Problem List   Diagnosis Date Noted   Status post total right knee replacement 08/21/2019   Hashimoto's thyroiditis 12/13/2018   Adhesive capsulitis of right shoulder 09/08/2018   Paroxysmal SVT (supraventricular tachycardia) ([place]) 09/08/2018   Osteoarthritis of right knee 05/27/2016   COPD (chronic obstructive pulmonary  disease) ([place]) 03/02/2016   Essential familial hypercholesterolemia 03/02/2016   IBS (irritable bowel syndrome) 03/02/2016   Migraine headache 03/02/2016   Chronic fatigue syndrome 03/02/2016   Vitamin D deficiency 01/15/2015   Chronic back pain 02/13/2014   PVD (peripheral vascular disease) ([place]) 08/16/2012   Menopause syndrome 02/28/2012   Chronic pain 07/27/2011   Essential hypertension 12/07/2010   Insomnia 09/07/2010   Hypothyroidism 07/06/2010   GAD (generalized anxiety disorder) 07/06/2010   Fibromyalgia 07/06/2010   Osteoporosis 07/06/2010    Farley Ly PT, MPT 05/22/2021, 11:43 AM  PHYSICAL THERAPY DISCHARGE SUMMARY  Visits from Start of Care: 1  Current functional level related to goals / functional outcomes: See note   Remaining deficits: See note   Education / Equipment: HEP   Patient agrees to discharge. Patient goals were not met. Patient is being discharged due to not returning since the last visit.  Scot Jun, PT, DPT, OCS, ATC 06/30/21  10:58 AM     [facility] [address] [place], [place], [address] Phone: [phone]   Fax:  [phone]  Name: Joann Ray MRN: [id number] Date of Birth: 10-28-1946

## 2021-05-22 NOTE — Patient Instructions (Signed)
Access Code: [email or web address] URL: [email or web address] Date: 05/22/2021 Prepared by: Vista Mink  Exercises Supine Quadricep Sets - 2-3 x daily - 7 x weekly - 2-3 sets - 10 reps - 5 second hold Small Range Straight Leg Raise - 2-3 x daily - 7 x weekly - 2-5 sets - 5 reps - 3 seconds hold

## 2021-05-25 ENCOUNTER — Telehealth: Payer: Self-pay | Admitting: Internal Medicine

## 2021-05-25 NOTE — Telephone Encounter (Signed)
Notified patient of message from Dr. Henrene Pastor.

## 2021-05-25 NOTE — Telephone Encounter (Signed)
Inbound call from patient. She have a preocedure Wednesday 6/29. States on Sunday 6/26 she had a lot of walnuts and cashews. Today 6/27 had cashews. Wants to know if this will impact the procedure. Best contact number [phone]

## 2021-05-25 NOTE — Telephone Encounter (Signed)
Have her follow the dietary restrictions from this point forward.  Should be okay.  Thanks

## 2021-05-27 ENCOUNTER — Encounter: Payer: Self-pay | Admitting: Internal Medicine

## 2021-05-27 ENCOUNTER — Other Ambulatory Visit: Payer: Self-pay

## 2021-05-27 ENCOUNTER — Ambulatory Visit (AMBULATORY_SURGERY_CENTER): Payer: Medicare Other | Admitting: Internal Medicine

## 2021-05-27 VITALS — BP 137/51 | HR 59 | Temp 98.6°F | Resp 12 | Ht 62.0 in | Wt 138.0 lb

## 2021-05-27 DIAGNOSIS — K5140 Inflammatory polyps of colon without complications: Secondary | ICD-10-CM

## 2021-05-27 DIAGNOSIS — D125 Benign neoplasm of sigmoid colon: Secondary | ICD-10-CM | POA: Diagnosis not present

## 2021-05-27 DIAGNOSIS — K625 Hemorrhage of anus and rectum: Secondary | ICD-10-CM

## 2021-05-27 DIAGNOSIS — K51411 Inflammatory polyps of colon with rectal bleeding: Secondary | ICD-10-CM

## 2021-05-27 DIAGNOSIS — D120 Benign neoplasm of cecum: Secondary | ICD-10-CM

## 2021-05-27 DIAGNOSIS — D649 Anemia, unspecified: Secondary | ICD-10-CM

## 2021-05-27 DIAGNOSIS — D124 Benign neoplasm of descending colon: Secondary | ICD-10-CM

## 2021-05-27 MED ORDER — SODIUM CHLORIDE 0.9 % IV SOLN: 500.0000 mL | INTRAVENOUS | Status: DC

## 2021-05-27 NOTE — Progress Notes (Signed)
A/ox3, pleased with MAC, report to RN 

## 2021-05-27 NOTE — Patient Instructions (Signed)
YOU HAD AN ENDOSCOPIC PROCEDURE TODAY AT THE [facility]:   Refer to the procedure report that was given to you for any specific questions about what was found during the examination.  If the procedure report does not answer your questions, please call your gastroenterologist to clarify.  If you requested that your care partner not be given the details of your procedure findings, then the procedure report has been included in a sealed envelope for you to review at your convenience later.  YOU SHOULD EXPECT: Some feelings of bloating in the abdomen. Passage of more gas than usual.  Walking can help get rid of the air that was put into your GI tract during the procedure and reduce the bloating. If you had a lower endoscopy (such as a colonoscopy or flexible sigmoidoscopy) you may notice spotting of blood in your stool or on the toilet paper. If you underwent a bowel prep for your procedure, you may not have a normal bowel movement for a few days.  Please Note:  You might notice some irritation and congestion in your nose or some drainage.  This is from the oxygen used during your procedure.  There is no need for concern and it should clear up in a day or so.  SYMPTOMS TO REPORT IMMEDIATELY:   Following lower endoscopy (colonoscopy or flexible sigmoidoscopy):  Excessive amounts of blood in the stool  Significant tenderness or worsening of abdominal pains  Swelling of the abdomen that is new, acute  Fever of 100F or higher   Following upper endoscopy (EGD)  Vomiting of blood or coffee ground material  New chest pain or pain under the shoulder blades  Painful or persistently difficult swallowing  New shortness of breath  Fever of 100F or higher  Black, tarry-looking stools  For urgent or emergent issues, a gastroenterologist can be reached at any hour by calling [phone]. Do not use MyChart messaging for urgent concerns.    DIET:  We do recommend a small meal at first, but  then you may proceed to your regular diet.  Drink plenty of fluids but you should avoid alcoholic beverages for 24 hours.  ACTIVITY:  You should plan to take it easy for the rest of today and you should NOT DRIVE or use heavy machinery until tomorrow (because of the sedation medicines used during the test).    FOLLOW UP: Our staff will call the number listed on your records 48-72 hours following your procedure to check on you and address any questions or concerns that you may have regarding the information given to you following your procedure. If we do not reach you, we will leave a message.  We will attempt to reach you two times.  During this call, we will ask if you have developed any symptoms of COVID 19. If you develop any symptoms (ie: fever, flu-like symptoms, shortness of breath, cough etc.) before then, please call [phone].  If you test positive for Covid 19 in the 2 weeks post procedure, please call and report this information to us.    If any biopsies were taken you will be contacted by phone or by letter within the next 1-3 weeks.  Please call us at [phone] if you have not heard about the biopsies in 3 weeks.    SIGNATURES/CONFIDENTIALITY: You and/or your care partner have signed paperwork which will be entered into your electronic medical record.  These signatures attest to the fact that that the information above on   your After Visit Summary has been reviewed and is understood.  Full responsibility of the confidentiality of this discharge information lies with you and/or your care-partner. 

## 2021-05-27 NOTE — Op Note (Signed)
[facility] Patient Name: Oscar Forman Procedure Date: 05/27/2021 4:52 PM MRN: [id number] Endoscopist: Docia Chuck. 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 The patient's tolerance of                            previous anesthesia was also reviewed. The risks                            and benefits of the procedure and the sedation                            options and risks were discussed with the patient.                            All questions were answered, and informed consent                            was obtained. Prior Anticoagulants: The patient has                            taken no previous anticoagulant or antiplatelet                            agents. ASA Grade Assessment: II - A patient with                            mild systemic disease. After reviewing the risks  and benefits, the patient was deemed in                            satisfactory condition to undergo the procedure.                           After obtaining informed consent, the colonoscope                            was passed under direct vision. Throughout the                            procedure, the patient's blood pressure, pulse, and                            oxygen saturations  were monitored continuously. The                            Olympus PFC-[id number] (#[id number]) Colonoscope was                            introduced through the anus and advanced to the the                            cecum, identified by appendiceal orifice and                            ileocecal valve. The ileocecal valve, appendiceal                            orifice, and rectum were photographed. The quality                            of the bowel preparation was excellent. The                            colonoscopy was performed without difficulty. The                            patient tolerated the procedure well. The bowel                            preparation used was SUPREP via split dose                            instruction. Scope In: 5:02:19 PM Scope Out: 5:28:43 PM Scope Withdrawal Time: 0 hours 22 minutes 41 seconds  Total Procedure Duration: 0 hours 26 minutes 24 seconds  Findings:                 A 30 mm polyp was found in the sigmoid colon. The                            polyp was pedunculated. The polyp was removed with  a hot snare. Resection and retrieval (with Jabier Mutton                            net) were complete. Area was tattooed with an                            injection of Spot (carbon black). See images.                           Three polyps were found in the descending colon and                            cecum. The polyps were 2 to 4 mm in size. These                            polyps were removed with a cold snare. Resection                            and retrieval were complete.                           A few small-mouthed diverticula were found in the                            sigmoid colon.                           The exam was otherwise without abnormality on                            direct and retroflexion views. Complications:            No immediate complications. Estimated blood loss:                             None. Estimated Blood Loss:     Estimated blood loss: none. Impression:               - One 30 mm polyp in the sigmoid colon, removed                            with a hot snare. Resected and retrieved. Tattooed.                           - Three 2 to 4 mm polyps in the descending colon                            and in the cecum, removed with a cold snare.                            Resected and retrieved.                           - Diverticulosis in the sigmoid colon.                           -  The examination was otherwise normal on direct                            and retroflexion views. Recommendation:           - Repeat colonoscopy in 3 years for surveillance                            (if pathology benign).                           - Patient has a contact number available for                            emergencies. The signs and symptoms of potential                            delayed complications were discussed with the                            patient. Return to normal activities tomorrow.                            Written discharge instructions were provided to the                            patient.                           - Resume previous diet.                           - Continue present medications.                           - Await pathology results. Docia Chuck. Henrene Pastor, MD 05/27/2021 5:42:23 PM This report has been signed electronically.

## 2021-05-27 NOTE — Progress Notes (Signed)
Called to room to assist during endoscopic procedure.  Patient ID and intended procedure confirmed with present staff. Received instructions for my participation in the procedure from the performing physician.  

## 2021-05-27 NOTE — Progress Notes (Signed)
[facility] [employer] 

## 2021-05-29 ENCOUNTER — Telehealth: Payer: Self-pay | Admitting: *Deleted

## 2021-05-29 ENCOUNTER — Telehealth: Payer: Self-pay

## 2021-05-29 NOTE — Telephone Encounter (Signed)
  Follow up Call-  Call back number 05/27/2021  Post procedure Call Back phone  # [phone]  Permission to leave phone message Yes  Some recent data might be hidden     Patient questions:  Do you have a fever, pain , or abdominal swelling? No. Pain Score  0 *  Have you tolerated food without any problems? Yes.    Have you been able to return to your normal activities? Yes.    Do you have any questions about your discharge instructions: Diet   No. Medications  No. Follow up visit  No.  Do you have questions or concerns about your Care? No.  Actions: * If pain score is 4 or above: No action needed, pain <4.  Have you developed a fever since your procedure? No   2.   Have you had an respiratory symptoms (SOB or cough) since your procedure? No   3.   Have you tested positive for COVID 19 since your procedure no   4.   Have you had any family members/close contacts diagnosed with the COVID 19 since your procedure?  No    If yes to any of these questions please route to Joylene John, RN and Joella Prince, RN

## 2021-05-29 NOTE — Telephone Encounter (Signed)
Message left

## 2021-06-02 ENCOUNTER — Other Ambulatory Visit: Payer: Self-pay | Admitting: Family Medicine

## 2021-06-02 ENCOUNTER — Encounter: Payer: Self-pay | Admitting: Internal Medicine

## 2021-06-02 DIAGNOSIS — K219 Gastro-esophageal reflux disease without esophagitis: Secondary | ICD-10-CM | POA: Insufficient documentation

## 2021-06-02 DIAGNOSIS — E785 Hyperlipidemia, unspecified: Secondary | ICD-10-CM | POA: Insufficient documentation

## 2021-06-02 DIAGNOSIS — Z1231 Encounter for screening mammogram for malignant neoplasm of breast: Secondary | ICD-10-CM

## 2021-06-02 DIAGNOSIS — D649 Anemia, unspecified: Secondary | ICD-10-CM | POA: Insufficient documentation

## 2021-06-02 DIAGNOSIS — M810 Age-related osteoporosis without current pathological fracture: Secondary | ICD-10-CM

## 2021-06-09 ENCOUNTER — Encounter: Payer: Medicare Other | Admitting: Physical Therapy

## 2021-06-10 ENCOUNTER — Ambulatory Visit: Payer: Medicare Other | Admitting: Gastroenterology

## 2021-06-11 ENCOUNTER — Encounter: Payer: Medicare Other | Admitting: Physical Therapy

## 2021-06-19 ENCOUNTER — Inpatient Hospital Stay: Admission: RE | Admit: 2021-06-19 | Payer: Medicare Other | Source: Ambulatory Visit

## 2021-06-22 ENCOUNTER — Ambulatory Visit: Payer: Medicare Other | Admitting: Orthopaedic Surgery

## 2021-06-26 ENCOUNTER — Encounter: Payer: Medicare Other | Admitting: Rehabilitative and Restorative Service Providers"

## 2021-06-30 ENCOUNTER — Ambulatory Visit: Payer: Medicare Other

## 2021-07-03 ENCOUNTER — Encounter: Payer: Medicare Other | Admitting: Internal Medicine

## 2021-08-18 ENCOUNTER — Inpatient Hospital Stay: Admission: RE | Admit: 2021-08-18 | Payer: Medicare Other | Source: Ambulatory Visit

## 2021-10-19 ENCOUNTER — Other Ambulatory Visit: Payer: Self-pay | Admitting: Orthopaedic Surgery

## 2021-10-19 ENCOUNTER — Telehealth: Payer: Self-pay | Admitting: Orthopaedic Surgery

## 2021-10-19 MED ORDER — ACETAMINOPHEN-CODEINE #3 300-30 MG PO TABS: 1.0000 | ORAL_TABLET | Freq: Three times a day (TID) | ORAL | 0 refills | Status: DC | PRN

## 2021-10-19 NOTE — Telephone Encounter (Signed)
Pt would like to know if she can get some pain medication.   CB [phone]

## 2021-10-19 NOTE — Telephone Encounter (Signed)
Please advise 

## 2021-10-19 NOTE — Telephone Encounter (Signed)
Called and advised pt.

## 2021-11-03 ENCOUNTER — Other Ambulatory Visit: Payer: Self-pay | Admitting: Family Medicine

## 2021-11-03 DIAGNOSIS — R109 Unspecified abdominal pain: Secondary | ICD-10-CM

## 2021-11-04 ENCOUNTER — Ambulatory Visit (INDEPENDENT_AMBULATORY_CARE_PROVIDER_SITE_OTHER): Payer: Medicare Other | Admitting: Orthopaedic Surgery

## 2021-11-04 DIAGNOSIS — M25561 Pain in right knee: Secondary | ICD-10-CM

## 2021-11-04 DIAGNOSIS — G8929 Other chronic pain: Secondary | ICD-10-CM | POA: Diagnosis not present

## 2021-11-04 DIAGNOSIS — M25562 Pain in left knee: Secondary | ICD-10-CM

## 2021-11-04 DIAGNOSIS — Z96651 Presence of right artificial knee joint: Secondary | ICD-10-CM | POA: Diagnosis not present

## 2021-11-04 MED ORDER — LIDOCAINE HCL 1 % IJ SOLN
3.0000 mL | INTRAMUSCULAR | Status: AC | PRN
Start: 2021-11-04 — End: 2021-11-04
  Administered 2021-11-04: 3 mL

## 2021-11-04 MED ORDER — ACETAMINOPHEN-CODEINE #3 300-30 MG PO TABS: 1.0000 | ORAL_TABLET | Freq: Three times a day (TID) | ORAL | 0 refills | Status: DC | PRN

## 2021-11-04 MED ORDER — METHYLPREDNISOLONE ACETATE 40 MG/ML IJ SUSP
40.0000 mg | INTRAMUSCULAR | Status: AC | PRN
Start: 2021-11-04 — End: 2021-11-04
  Administered 2021-11-04: 40 mg via INTRA_ARTICULAR

## 2021-11-04 NOTE — Progress Notes (Signed)
Office Visit Note   Patient: Joann Ray           Date of Birth: [birth year]           MRN: [id number] Visit Date: 11/04/2021              Requested by: Percell Belt, DO [address] [place],  [place] [id number] PCP: Percell Belt, DO   Assessment & Plan: Visit Diagnoses:  1. Chronic pain of right knee   2. Status post total right knee replacement   3. Chronic pain of left knee     Plan: I did place a steroid injection in her left knee today which she tolerated well.  I for suggested she have knee sleeves for both knees.  The only thing I can consider doing for the right knee at this point would be upsizing her polyliner to keep her from hyperextending.  It may decrease some of her flexion but this could help her feel better from a stability standpoint.  I talked her about this and she wants to consider it.  She is asking for some pain medicine for the holiday so I will send in for Tylenol 3.  I like to see her back in 4 weeks to see how she is doing overall and to Discuss the possibility of a polyliner exchange for her right knee.  Follow-Up Instructions: Return in about 4 weeks (around 12/02/2021).   Orders:  Orders Placed This Encounter  Procedures   Large Joint Inj   No orders of the defined types were placed in this encounter.     Procedures: Large Joint Inj: L knee on 11/04/2021 3:10 PM Indications: diagnostic evaluation and pain Details: 22 G 1.5 in needle, superolateral approach  Arthrogram: No  Medications: 3 mL lidocaine 1 %; 40 mg methylPREDNISolone acetate 40 MG/ML Outcome: tolerated well, no immediate complications Procedure, treatment alternatives, risks and benefits explained, specific risks discussed. Consent was given by the patient. Immediately prior to procedure a time out was called to verify the correct patient, procedure, equipment, support staff and site/side marked as required. Patient was prepped and draped in the usual sterile fashion.       Clinical Data: No additional findings.   Subjective: Chief Complaint  Patient presents with   Right Knee - Pain  The patient comes in with continued right knee pain but also left knee pain.  She is 75 years old and we replaced her right knee in 2020.  She feels like when she stands that the right total knee hyperextends.  The left knee is now starting to bother her.  We have worked up her right knee before for prosthetic loosening and that was negative.  Her x-rays earlier this year showed a well-seated total knee arthroplasty.  HPI  Review of Systems She currently denies any headache, chest pain, shortness of breath, fever, chills, nausea, vomiting  Objective: Vital Signs: There were no vitals taken for this visit.  Physical Exam She is alert and orient x3 and in no acute distress Ortho Exam She has been wearing a knee sleeve for her right knee and I suggested she get this for her left knee.  Her right knee shows no effusion.  It is cool.  It is ligamentously stable my exam.  I do see when she stands that she does seem to be in more of a hyperextended position with her right knee and not her left knee.  The left knee  does have a mild effusion but no significant warmth and good range of motion. Specialty Comments:  No specialty comments available.  Imaging: No results found.   PMFS History: Patient Active Problem List   Diagnosis Date Noted   Status post total right knee replacement 08/21/2019   Hashimoto's thyroiditis 12/13/2018   Adhesive capsulitis of right shoulder 09/08/2018   Paroxysmal SVT (supraventricular tachycardia) ([facility]) 09/08/2018   Osteoarthritis of right knee 05/27/2016   COPD (chronic obstructive pulmonary disease) ([facility]) 03/02/2016   Essential familial hypercholesterolemia 03/02/2016   IBS (irritable bowel syndrome) 03/02/2016   Migraine headache 03/02/2016   Chronic fatigue syndrome 03/02/2016   Vitamin D deficiency 01/15/2015   Chronic back pain  02/13/2014   PVD (peripheral vascular disease) ([facility]) 08/16/2012   Menopause syndrome 02/28/2012   Chronic pain 07/27/2011   Essential hypertension 12/07/2010   Insomnia 09/07/2010   Hypothyroidism 07/06/2010   GAD (generalized anxiety disorder) 07/06/2010   Fibromyalgia 07/06/2010   Osteoporosis 07/06/2010   Past Medical History:  Diagnosis Date   Allergy    Anemia    Anxiety    Arrhythmia     AVNRT   Arthritis    Asthma    Blood transfusion without reported diagnosis    Cataract    COPD (chronic obstructive pulmonary disease) ([facility])    Depression    Family history of adverse reaction to anesthesia    " My yougest daughter gets real sick "   Fibromyalgia    Hyperlipidemia    Hypertension    Hypothyroidism    Migraine    PONV (postoperative nausea and vomiting)     Family History  Problem Relation Age of Onset   Diabetes Mother    Stroke Mother    Dementia Mother    Skin cancer Father    Colon cancer Sister 42   Stroke Sister    Colon cancer Brother 41   Breast cancer Paternal Aunt    Stomach cancer Paternal Aunt    Esophageal cancer Neg Hx     Past Surgical History:  Procedure Laterality Date   ABDOMINAL HYSTERECTOMY     APPENDECTOMY     CARDIAC ELECTROPHYSIOLOGY MAPPING AND ABLATION  08/28/2010   EPS/RFA of AVNRT  - Dr. Crissie Sickles   CATARACT EXTRACTION Bilateral    CORONARY STENT PLACEMENT  09/2012   IR GENERIC HISTORICAL  07/29/2016   IR RADIOLOGY PERIPHERAL GUIDED IV START 07/29/2016 Corrie Mckusick, DO [facility]   IR GENERIC HISTORICAL  07/29/2016   IR US GUIDE VASC ACCESS RIGHT 07/29/2016 Corrie Mckusick, DO [facility]   NASAL SINUS SURGERY  1993   PERCUTANEOUS STENT INTERVENTION Left 10/18/2012   Procedure: PERCUTANEOUS STENT INTERVENTION;  Surgeon: Sherren Mocha, MD;  Location: [facility] C[facility] LAB;  Service: Cardiovascular;  Laterality: Left;   TOTAL KNEE ARTHROPLASTY Right 08/21/2019   TOTAL KNEE ARTHROPLASTY Right 08/21/2019   Procedure: RIGHT TOTAL  KNEE ARTHROPLASTY;  Surgeon: Mcarthur Rossetti, MD;  Location: [facility];  Service: Orthopedics;  Laterality: Right;   UNILATERAL UPPER EXTREMEITY ANGIOGRAM Left 10/18/2012   Procedure: UNILATERAL UPPER Anselmo Rod;  Surgeon: Sherren Mocha, MD;  Location: [facility] LAB;  Service: Cardiovascular;  Laterality: Left;   Social History   Occupational History   Occupation: DISABLED    Employer: UNEMPLOYED  Tobacco Use   Smoking status: Former    Packs/day: 0.50    Years: 40.00    Pack years: 20.00    Types: Cigarettes    Quit date: 2018  Years since quitting: 4.9   Smokeless tobacco: Never   Tobacco comments:    September 2011, again Oct 2013  Vaping Use   Vaping Use: Never used  Substance and Sexual Activity   Alcohol use: No   Drug use: No   Sexual activity: Not on file

## 2021-11-13 ENCOUNTER — Ambulatory Visit: Payer: Medicare Other

## 2021-11-13 ENCOUNTER — Other Ambulatory Visit: Payer: Medicare Other

## 2021-11-26 ENCOUNTER — Inpatient Hospital Stay: Admission: RE | Admit: 2021-11-26 | Payer: Medicare Other | Source: Ambulatory Visit

## 2021-12-03 ENCOUNTER — Ambulatory Visit: Payer: Medicare Other | Admitting: Orthopaedic Surgery

## 2021-12-18 ENCOUNTER — Other Ambulatory Visit: Payer: Self-pay | Admitting: Orthopaedic Surgery

## 2021-12-18 ENCOUNTER — Telehealth: Payer: Self-pay | Admitting: Orthopaedic Surgery

## 2021-12-18 MED ORDER — ACETAMINOPHEN-CODEINE #3 300-30 MG PO TABS: 1.0000 | ORAL_TABLET | Freq: Three times a day (TID) | ORAL | 0 refills | Status: DC | PRN

## 2021-12-18 NOTE — Telephone Encounter (Signed)
Patient called needing Rx refilled Tylenol 3. The number to contact patient is [phone]

## 2021-12-28 ENCOUNTER — Ambulatory Visit: Payer: Medicare Other | Admitting: Orthopaedic Surgery

## 2022-01-15 ENCOUNTER — Telehealth: Payer: Self-pay | Admitting: Orthopaedic Surgery

## 2022-01-15 ENCOUNTER — Other Ambulatory Visit: Payer: Self-pay | Admitting: Orthopaedic Surgery

## 2022-01-15 MED ORDER — ACETAMINOPHEN-CODEINE #3 300-30 MG PO TABS: 1.0000 | ORAL_TABLET | Freq: Three times a day (TID) | ORAL | 0 refills | Status: DC | PRN

## 2022-01-15 NOTE — Telephone Encounter (Signed)
Pt called. She would like refill on tylenol #3.

## 2022-01-25 ENCOUNTER — Ambulatory Visit (INDEPENDENT_AMBULATORY_CARE_PROVIDER_SITE_OTHER): Payer: Medicare Other | Admitting: Orthopaedic Surgery

## 2022-01-25 ENCOUNTER — Other Ambulatory Visit: Payer: Self-pay

## 2022-01-25 ENCOUNTER — Encounter: Payer: Self-pay | Admitting: Orthopaedic Surgery

## 2022-01-25 DIAGNOSIS — M5431 Sciatica, right side: Secondary | ICD-10-CM

## 2022-01-25 DIAGNOSIS — M25562 Pain in left knee: Secondary | ICD-10-CM

## 2022-01-25 DIAGNOSIS — Z96651 Presence of right artificial knee joint: Secondary | ICD-10-CM

## 2022-01-25 DIAGNOSIS — M25561 Pain in right knee: Secondary | ICD-10-CM | POA: Diagnosis not present

## 2022-01-25 DIAGNOSIS — M25571 Pain in right ankle and joints of right foot: Secondary | ICD-10-CM

## 2022-01-25 DIAGNOSIS — M25551 Pain in right hip: Secondary | ICD-10-CM

## 2022-01-25 DIAGNOSIS — G8929 Other chronic pain: Secondary | ICD-10-CM

## 2022-01-25 DIAGNOSIS — M79661 Pain in right lower leg: Secondary | ICD-10-CM

## 2022-01-25 MED ORDER — LIDOCAINE HCL 1 % IJ SOLN
3.0000 mL | INTRAMUSCULAR | Status: AC | PRN
  Administered 2022-01-25: 3 mL

## 2022-01-25 MED ORDER — METHYLPREDNISOLONE ACETATE 40 MG/ML IJ SUSP
40.0000 mg | INTRAMUSCULAR | Status: AC | PRN
  Administered 2022-01-25: 40 mg via INTRA_ARTICULAR

## 2022-01-25 MED ORDER — ACETAMINOPHEN-CODEINE #3 300-30 MG PO TABS: 1.0000 | ORAL_TABLET | Freq: Three times a day (TID) | ORAL | 0 refills | Status: DC | PRN

## 2022-01-25 NOTE — Progress Notes (Signed)
Office Visit Note   Patient: Joann Ray           Date of Birth: 02-May-1946           MRN: [id number] Visit Date: 01/25/2022              Requested by: Percell Belt, DO [address] [place],  [place] [id number] PCP: Percell Belt, DO   Assessment & Plan: Visit Diagnoses:  1. Chronic pain of right knee   2. Chronic pain of left knee   3. Status post total right knee replacement     Plan: Per her request I did place a steroid injection in her left knee which she tolerated well.  We talked about the possibility of a polyliner exchange and upsizing for right knee, but I would like to hold off on this until she knows what is going on from a GI standpoint given her colonoscopy findings and the fact that they are recommending further studies.  I would also like to send her to chronic pain management.  I did refill her Tylenol 3.  I will see her back in 3 months to see how she is doing overall.  I would like an AP and lateral of her right knee at that visit.  Follow-Up Instructions: Return in about 3 months (around 04/24/2022).   Orders:  Orders Placed This Encounter  Procedures   Large Joint Inj   Meds ordered this encounter  Medications   acetaminophen-codeine (TYLENOL #3) 300-30 MG tablet    Sig: Take 1-2 tablets by mouth every 8 (eight) hours as needed.    Dispense:  30 tablet    Refill:  0      Procedures: Large Joint Inj: L knee on 01/25/2022 1:51 PM Indications: diagnostic evaluation and pain Details: 22 G 1.5 in needle, superolateral approach  Arthrogram: No  Medications: 3 mL lidocaine 1 %; 40 mg methylPREDNISolone acetate 40 MG/ML Outcome: tolerated well, no immediate complications Procedure, treatment alternatives, risks and benefits explained, specific risks discussed. Consent was given by the patient. Immediately prior to procedure a time out was called to verify the correct patient, procedure, equipment, support staff and site/side marked as  required. Patient was prepped and draped in the usual sterile fashion.      Clinical Data: No additional findings.   Subjective: Chief Complaint  Patient presents with   Left Knee - Follow-up   Right Knee - Follow-up  The patient is now over 2 and half years status post a right total knee arthroplasty.  She is 76 years old and her knee is always hurt since the surgery.  She is requesting a steroid injection in her left knee today and has been 3 months since her last steroid injection her left knee.  We have talked about a polyliner exchange for her right knee due to some slight play in the knee.  However there is been no evidence of loosening of the prosthesis itself and no evidence of infection.  She is on Tylenol 3 and I have recommended chronic pain management at this standpoint as well.  She told me that her recent colonoscopy worrisome for her having to have more significant imaging of her stomach she states.  HPI  Review of Systems   Objective: Vital Signs: There were no vitals taken for this visit.  Physical Exam She is alert and orient x3 and does not walk with an assistive device. Ortho Exam Examination of both knees  today shows no effusion at all.  Both knees are nice and cool.  Both knees have good range of motion.  There is only slight plate in her right operative knee in terms of ligament stability but there is no instability on my exam. Specialty Comments:  No specialty comments available.  Imaging: No results found.   PMFS History: Patient Active Problem List   Diagnosis Date Noted   Status post total right knee replacement 08/21/2019   Hashimoto's thyroiditis 12/13/2018   Adhesive capsulitis of right shoulder 09/08/2018   Paroxysmal SVT (supraventricular tachycardia) ([place]) 09/08/2018   Osteoarthritis of right knee 05/27/2016   COPD (chronic obstructive pulmonary disease) ([place]) 03/02/2016   Essential familial hypercholesterolemia 03/02/2016   IBS (irritable  bowel syndrome) 03/02/2016   Migraine headache 03/02/2016   Chronic fatigue syndrome 03/02/2016   Vitamin D deficiency 01/15/2015   Chronic back pain 02/13/2014   PVD (peripheral vascular disease) ([place]) 08/16/2012   Menopause syndrome 02/28/2012   Chronic pain 07/27/2011   Essential hypertension 12/07/2010   Insomnia 09/07/2010   Hypothyroidism 07/06/2010   GAD (generalized anxiety disorder) 07/06/2010   Fibromyalgia 07/06/2010   Osteoporosis 07/06/2010   Past Medical History:  Diagnosis Date   Allergy    Anemia    Anxiety    Arrhythmia     AVNRT   Arthritis    Asthma    Blood transfusion without reported diagnosis    Cataract    COPD (chronic obstructive pulmonary disease) ([place])    Depression    Family history of adverse reaction to anesthesia    " My yougest daughter gets real sick "   Fibromyalgia    Hyperlipidemia    Hypertension    Hypothyroidism    Migraine    PONV (postoperative nausea and vomiting)     Family History  Problem Relation Age of Onset   Diabetes Mother    Stroke Mother    Dementia Mother    Skin cancer Father    Colon cancer Sister 73   Stroke Sister    Colon cancer Brother 33   Breast cancer Paternal Aunt    Stomach cancer Paternal Aunt    Esophageal cancer Neg Hx     Past Surgical History:  Procedure Laterality Date   ABDOMINAL HYSTERECTOMY     APPENDECTOMY     CARDIAC ELECTROPHYSIOLOGY MAPPING AND ABLATION  08/28/2010   EPS/RFA of AVNRT  - Dr. Crissie Sickles   CATARACT EXTRACTION Bilateral    CORONARY STENT PLACEMENT  09/2012   IR GENERIC HISTORICAL  07/29/2016   IR RADIOLOGY PERIPHERAL GUIDED IV START 07/29/2016 Corrie Mckusick, DO [facility]   IR GENERIC HISTORICAL  07/29/2016   IR US GUIDE VASC ACCESS RIGHT 07/29/2016 Corrie Mckusick, DO [facility]   NASAL SINUS SURGERY  1993   PERCUTANEOUS STENT INTERVENTION Left 10/18/2012   Procedure: PERCUTANEOUS STENT INTERVENTION;  Surgeon: Sherren Mocha, MD;  Location: [facility] CATH LAB;   Service: Cardiovascular;  Laterality: Left;   TOTAL KNEE ARTHROPLASTY Right 08/21/2019   TOTAL KNEE ARTHROPLASTY Right 08/21/2019   Procedure: RIGHT TOTAL KNEE ARTHROPLASTY;  Surgeon: Mcarthur Rossetti, MD;  Location: [facility];  Service: Orthopedics;  Laterality: Right;   UNILATERAL UPPER EXTREMEITY ANGIOGRAM Left 10/18/2012   Procedure: UNILATERAL UPPER Anselmo Rod;  Surgeon: Sherren Mocha, MD;  Location: [facility] CATH LAB;  Service: Cardiovascular;  Laterality: Left;   Social History   Occupational History   Occupation: DISABLED    Employer: UNEMPLOYED  Tobacco Use  Smoking status: Former    Packs/day: 0.50    Years: 40.00    Pack years: 20.00    Types: Cigarettes    Quit date: 2018    Years since quitting: 5.1   Smokeless tobacco: Never   Tobacco comments:    September 2011, again Oct 2013  Vaping Use   Vaping Use: Never used  Substance and Sexual Activity   Alcohol use: No   Drug use: No   Sexual activity: Not on file

## 2022-02-03 DIAGNOSIS — N1831 Chronic kidney disease, stage 3a: Secondary | ICD-10-CM | POA: Insufficient documentation

## 2022-02-15 ENCOUNTER — Encounter: Payer: Self-pay | Admitting: Physical Medicine and Rehabilitation

## 2022-02-17 ENCOUNTER — Ambulatory Visit
Admission: RE | Admit: 2022-02-17 | Discharge: 2022-02-17 | Disposition: A | Discharge status: Home / Self Care | Payer: Medicare Other | Source: Ambulatory Visit | Attending: Family Medicine | Admitting: Family Medicine

## 2022-02-17 DIAGNOSIS — R109 Unspecified abdominal pain: Secondary | ICD-10-CM

## 2022-02-17 MED ORDER — IOPAMIDOL (ISOVUE-300) INJECTION 61%
100.0000 mL | Freq: Once | INTRAVENOUS | Status: AC | PRN
  Administered 2022-02-17: 100 mL via INTRAVENOUS

## 2022-04-02 ENCOUNTER — Telehealth: Payer: Self-pay | Admitting: Orthopaedic Surgery

## 2022-04-02 ENCOUNTER — Other Ambulatory Visit: Payer: Self-pay | Admitting: Orthopaedic Surgery

## 2022-04-02 MED ORDER — ACETAMINOPHEN-CODEINE #3 300-30 MG PO TABS: 1.0000 | ORAL_TABLET | Freq: Three times a day (TID) | ORAL | 0 refills | Status: DC | PRN

## 2022-04-02 NOTE — Telephone Encounter (Signed)
Please advise 

## 2022-04-02 NOTE — Telephone Encounter (Signed)
Patient called asked if she can get something called into her pharmacy for pain. Patient said her ankle . The number to contact is [phone]  ?

## 2022-04-23 ENCOUNTER — Other Ambulatory Visit (HOSPITAL_COMMUNITY): Payer: Self-pay

## 2022-06-04 ENCOUNTER — Ambulatory Visit: Payer: Medicare Other | Admitting: Physical Medicine and Rehabilitation

## 2022-06-04 ENCOUNTER — Encounter: Payer: Medicare Other | Admitting: Physical Medicine and Rehabilitation

## 2022-07-01 ENCOUNTER — Encounter: Payer: Medicare Other | Admitting: Physical Medicine & Rehabilitation

## 2022-07-13 ENCOUNTER — Other Ambulatory Visit: Payer: Self-pay | Admitting: Orthopaedic Surgery

## 2022-07-13 ENCOUNTER — Telehealth: Payer: Self-pay | Admitting: Orthopaedic Surgery

## 2022-07-13 MED ORDER — ACETAMINOPHEN-CODEINE 300-30 MG PO TABS: 1.0000 | ORAL_TABLET | Freq: Three times a day (TID) | ORAL | 0 refills | Status: DC | PRN

## 2022-07-13 NOTE — Telephone Encounter (Signed)
Please advise 

## 2022-07-13 NOTE — Telephone Encounter (Signed)
Pt called requesting pain medication. Pt states she has not had time to go to the pain clinic and she has to fly and see her sister. Please call pt about this matter at [phone].

## 2022-09-03 ENCOUNTER — Encounter: Payer: Medicare Other | Attending: Physical Medicine and Rehabilitation | Admitting: Physical Medicine & Rehabilitation

## 2022-10-14 ENCOUNTER — Ambulatory Visit: Payer: Medicare Other | Admitting: Physician Assistant

## 2022-10-25 ENCOUNTER — Ambulatory Visit: Payer: Medicare Other | Admitting: Physician Assistant

## 2022-11-05 ENCOUNTER — Other Ambulatory Visit: Payer: Self-pay | Admitting: Orthopaedic Surgery

## 2022-11-05 ENCOUNTER — Telehealth: Payer: Self-pay | Admitting: Orthopaedic Surgery

## 2022-11-05 MED ORDER — ACETAMINOPHEN-CODEINE 300-30 MG PO TABS: 1.0000 | ORAL_TABLET | Freq: Three times a day (TID) | ORAL | 0 refills | Status: DC | PRN

## 2022-11-05 NOTE — Telephone Encounter (Signed)
Patient called advised she lost her husband 10/30/2022 and couldn't come to her appointment. Patient called needing Rx refilled  Hydrocodone. The number to contact patient is [phone]

## 2022-12-16 ENCOUNTER — Other Ambulatory Visit (HOSPITAL_COMMUNITY): Payer: Self-pay

## 2022-12-16 MED ORDER — DICLOFENAC SODIUM 75 MG PO TBEC
75.0000 mg | DELAYED_RELEASE_TABLET | Freq: Two times a day (BID) | ORAL | 2 refills | Status: DC | PRN
  Filled 2022-12-16: qty 60, 30d supply, fill #0
  Filled 2023-01-14: qty 60, 30d supply, fill #1
  Filled 2023-02-11: qty 60, 30d supply, fill #2

## 2022-12-30 ENCOUNTER — Other Ambulatory Visit (HOSPITAL_COMMUNITY): Payer: Self-pay

## 2022-12-30 MED ORDER — SERTRALINE HCL 50 MG PO TABS
25.0000 mg | ORAL_TABLET | Freq: Every day | ORAL | 1 refills | Status: DC
  Filled 2022-12-30: qty 45, 90d supply, fill #0
  Filled 2023-05-13: qty 45, 90d supply, fill #1

## 2022-12-30 MED ORDER — CLONAZEPAM 0.5 MG PO TABS
0.5000 mg | ORAL_TABLET | Freq: Every day | ORAL | 0 refills | Status: DC
  Filled 2022-12-30: qty 40, 40d supply, fill #0

## 2022-12-31 ENCOUNTER — Other Ambulatory Visit (HOSPITAL_COMMUNITY): Payer: Self-pay

## 2022-12-31 MED ORDER — SYNTHROID 100 MCG PO TABS
100.0000 ug | ORAL_TABLET | Freq: Every morning | ORAL | 1 refills | Status: DC
  Filled 2022-12-31: qty 30, 30d supply, fill #0
  Filled 2023-04-13: qty 30, 30d supply, fill #1

## 2023-01-14 ENCOUNTER — Other Ambulatory Visit (HOSPITAL_COMMUNITY): Payer: Self-pay

## 2023-01-24 ENCOUNTER — Other Ambulatory Visit (HOSPITAL_COMMUNITY): Payer: Self-pay

## 2023-01-24 MED ORDER — CLONAZEPAM 0.5 MG PO TABS
0.5000 mg | ORAL_TABLET | Freq: Every day | ORAL | 0 refills | Status: DC
  Filled 2023-01-24 – 2023-01-29 (×2): qty 40, 40d supply, fill #0

## 2023-01-28 ENCOUNTER — Other Ambulatory Visit (HOSPITAL_COMMUNITY): Payer: Self-pay

## 2023-01-29 ENCOUNTER — Other Ambulatory Visit (HOSPITAL_COMMUNITY): Payer: Self-pay

## 2023-01-29 ENCOUNTER — Other Ambulatory Visit (HOSPITAL_BASED_OUTPATIENT_CLINIC_OR_DEPARTMENT_OTHER): Payer: Self-pay

## 2023-02-11 ENCOUNTER — Other Ambulatory Visit (HOSPITAL_COMMUNITY): Payer: Self-pay

## 2023-02-22 ENCOUNTER — Other Ambulatory Visit (HOSPITAL_COMMUNITY): Payer: Self-pay

## 2023-02-23 ENCOUNTER — Other Ambulatory Visit (HOSPITAL_COMMUNITY): Payer: Self-pay

## 2023-02-23 MED ORDER — CLONAZEPAM 0.5 MG PO TABS
0.5000 mg | ORAL_TABLET | Freq: Every day | ORAL | 0 refills | Status: DC
  Filled 2023-02-28: qty 40, 40d supply, fill #0

## 2023-02-25 ENCOUNTER — Other Ambulatory Visit (HOSPITAL_COMMUNITY): Payer: Self-pay

## 2023-02-28 ENCOUNTER — Other Ambulatory Visit (HOSPITAL_COMMUNITY): Payer: Self-pay

## 2023-02-28 ENCOUNTER — Other Ambulatory Visit (HOSPITAL_BASED_OUTPATIENT_CLINIC_OR_DEPARTMENT_OTHER): Payer: Self-pay

## 2023-03-11 ENCOUNTER — Other Ambulatory Visit (HOSPITAL_COMMUNITY): Payer: Self-pay

## 2023-03-11 ENCOUNTER — Other Ambulatory Visit: Payer: Self-pay

## 2023-03-11 MED ORDER — DICLOFENAC SODIUM 75 MG PO TBEC
75.0000 mg | DELAYED_RELEASE_TABLET | Freq: Two times a day (BID) | ORAL | 2 refills | Status: DC | PRN
  Filled 2023-03-11: qty 60, 30d supply, fill #0
  Filled 2023-04-13: qty 60, 30d supply, fill #1
  Filled 2023-05-24: qty 60, 30d supply, fill #2

## 2023-03-12 ENCOUNTER — Other Ambulatory Visit (HOSPITAL_COMMUNITY): Payer: Self-pay

## 2023-03-30 ENCOUNTER — Other Ambulatory Visit (HOSPITAL_COMMUNITY): Payer: Self-pay

## 2023-03-30 MED ORDER — AMITRIPTYLINE HCL 25 MG PO TABS
ORAL_TABLET | ORAL | 3 refills | Status: DC
  Filled 2023-03-30: qty 90, 90d supply, fill #0

## 2023-03-30 MED ORDER — CLONAZEPAM 0.5 MG PO TABS
ORAL_TABLET | ORAL | 0 refills | Status: DC
  Filled 2023-03-30: qty 60, 30d supply, fill #0

## 2023-04-01 ENCOUNTER — Other Ambulatory Visit (HOSPITAL_COMMUNITY): Payer: Self-pay

## 2023-04-01 MED ORDER — AMLODIPINE BESYLATE 2.5 MG PO TABS
2.5000 mg | ORAL_TABLET | Freq: Every day | ORAL | 1 refills | Status: DC
  Filled 2023-04-01: qty 90, 90d supply, fill #0
  Filled 2023-06-28: qty 90, 90d supply, fill #1

## 2023-04-01 MED ORDER — LOSARTAN POTASSIUM 100 MG PO TABS
100.0000 mg | ORAL_TABLET | Freq: Every day | ORAL | 1 refills | Status: DC
  Filled 2023-04-01: qty 90, 90d supply, fill #0
  Filled 2023-06-28: qty 90, 90d supply, fill #1

## 2023-04-13 ENCOUNTER — Other Ambulatory Visit (HOSPITAL_COMMUNITY): Payer: Self-pay

## 2023-04-15 ENCOUNTER — Other Ambulatory Visit (HOSPITAL_COMMUNITY): Payer: Self-pay

## 2023-04-18 ENCOUNTER — Other Ambulatory Visit (HOSPITAL_COMMUNITY): Payer: Self-pay

## 2023-04-21 ENCOUNTER — Other Ambulatory Visit (HOSPITAL_COMMUNITY): Payer: Self-pay

## 2023-04-26 ENCOUNTER — Other Ambulatory Visit: Payer: Self-pay

## 2023-04-26 ENCOUNTER — Other Ambulatory Visit (HOSPITAL_COMMUNITY): Payer: Self-pay

## 2023-04-26 MED ORDER — ALBUTEROL SULFATE HFA 108 (90 BASE) MCG/ACT IN AERS
2.0000 | INHALATION_SPRAY | Freq: Two times a day (BID) | RESPIRATORY_TRACT | 3 refills | Status: DC
  Filled 2023-04-26: qty 6.7, 30d supply, fill #0
  Filled 2023-09-02: qty 6.7, 30d supply, fill #1

## 2023-04-26 MED ORDER — FLUTICASONE-SALMETEROL 115-21 MCG/ACT IN AERO
2.0000 | INHALATION_SPRAY | Freq: Two times a day (BID) | RESPIRATORY_TRACT | 0 refills | Status: DC
  Filled 2023-04-26: qty 12, 30d supply, fill #0

## 2023-04-28 ENCOUNTER — Ambulatory Visit: Payer: Medicare Other | Admitting: Orthopedic Surgery

## 2023-04-28 ENCOUNTER — Other Ambulatory Visit (INDEPENDENT_AMBULATORY_CARE_PROVIDER_SITE_OTHER): Payer: Medicare Other

## 2023-04-28 DIAGNOSIS — M79671 Pain in right foot: Secondary | ICD-10-CM

## 2023-04-28 DIAGNOSIS — M21611 Bunion of right foot: Secondary | ICD-10-CM

## 2023-05-01 ENCOUNTER — Encounter: Payer: Self-pay | Admitting: Orthopedic Surgery

## 2023-05-01 NOTE — Progress Notes (Signed)
Office Visit Note   Patient: Joann Ray           Date of Birth: 1946/02/20           MRN: [id number] Visit Date: 04/28/2023              Requested by: Mattie Marlin, DO [address] [place],  [place] [address] PCP: Mattie Marlin, DO  Chief Complaint  Patient presents with   Right Foot - Pain      HPI: Patient is a 77 year old woman who presents with pain and swelling right foot.  Patient states she has pain on the dorsum of her foot.  Assessment & Plan: Visit Diagnoses:  1. Pain in right foot   2. Bunion of right foot     Plan: Recommended a stiff soled new balance walking sneaker to help unload the midfoot and forefoot.  Follow-Up Instructions: Return if symptoms worsen or fail to improve.   Ortho Exam  Patient is alert, oriented, no adenopathy, well-dressed, normal affect, normal respiratory effort. Examination patient is in a thin nonsupportive dance shoe.  She has a good dorsalis pedis pulse there are no plantar ulcers.  Patient has tenderness to palpation across the midfoot and pain to palpation of the MTP joint of the great toe with radiographic findings consistent with osteoarthritis.  Her great toe is on top of the second toe.  Radiographs show decreased bone mineral density of the right foot with collapse of the great toe MTP joint with a long second and third metatarsal plate  Imaging: No results found. No images are attached to the encounter.  Labs: Lab Results  Component Value Date   ESRSEDRATE 17 02/17/2015   ESRSEDRATE 18 04/17/2013   ESRSEDRATE 24 (H) 08/16/2012   CRP 0.4 (L) 02/17/2015   CRP 0.1 (L) 05/06/2011   REPTSTATUS 05/07/2011 FINAL 05/06/2011   CULT  05/06/2011    NO SALMONELLA, SHIGELLA, CAMPYLOBACTER, OR YERSINIA ISOLATED     Lab Results  Component Value Date   ALBUMIN 4.4 09/08/2018   ALBUMIN 4.1 01/09/2015   ALBUMIN 4.2 10/02/2013    Lab Results  Component Value Date   MG 2.2 07/06/2010   Lab Results   Component Value Date   VD25OH 13.78 (L) 01/09/2015    No results found for: "PREALBUMIN"    Latest Ref Rng & Units 08/23/2019    4:14 PM 08/23/2019    3:11 AM 08/22/2019    5:44 AM  CBC EXTENDED  WBC 4.0 - 10.5 K/uL  7.6  10.7   RBC 3.87 - 5.11 MIL/uL  2.30  3.12   Hemoglobin 12.0 - 15.0 g/dL 9.5  7.3  9.8   HCT 19.1 - 46.0 % 29.1  22.8  31.4   Platelets 150 - 400 K/uL  161  203      There is no height or weight on file to calculate BMI.  Orders:  Orders Placed This Encounter  Procedures   XR Foot 2 Views Right   No orders of the defined types were placed in this encounter.    Procedures: No procedures performed  Clinical Data: No additional findings.  ROS:  All other systems negative, except as noted in the HPI. Review of Systems  Objective: Vital Signs: There were no vitals taken for this visit.  Specialty Comments:  No specialty comments available.  PMFS History: Patient Active Problem List   Diagnosis Date Noted   Status post total right knee replacement  08/21/2019   Hashimoto's thyroiditis 12/13/2018   Adhesive capsulitis of right shoulder 09/08/2018   Paroxysmal SVT (supraventricular tachycardia) 09/08/2018   Osteoarthritis of right knee 05/27/2016   COPD (chronic obstructive pulmonary disease) (HCC) 03/02/2016   Essential familial hypercholesterolemia 03/02/2016   IBS (irritable bowel syndrome) 03/02/2016   Migraine headache 03/02/2016   Chronic fatigue syndrome 03/02/2016   Vitamin D deficiency 01/15/2015   Chronic back pain 02/13/2014   PVD (peripheral vascular disease) (HCC) 08/16/2012   Menopause syndrome 02/28/2012   Chronic pain 07/27/2011   Essential hypertension 12/07/2010   Insomnia 09/07/2010   Hypothyroidism 07/06/2010   GAD (generalized anxiety disorder) 07/06/2010   Fibromyalgia 07/06/2010   Osteoporosis 07/06/2010   Past Medical History:  Diagnosis Date   Allergy    Anemia    Anxiety    Arrhythmia     AVNRT   Arthritis     Asthma    Blood transfusion without reported diagnosis    Cataract    COPD (chronic obstructive pulmonary disease) (HCC)    Depression    Family history of adverse reaction to anesthesia    " My yougest daughter gets real sick "   Fibromyalgia    Hyperlipidemia    Hypertension    Hypothyroidism    Migraine    PONV (postoperative nausea and vomiting)     Family History  Problem Relation Age of Onset   Diabetes Mother    Stroke Mother    Dementia Mother    Skin cancer Father    Colon cancer Sister 74   Stroke Sister    Colon cancer Brother 69   Breast cancer Paternal Aunt    Stomach cancer Paternal Aunt    Esophageal cancer Neg Hx     Past Surgical History:  Procedure Laterality Date   ABDOMINAL HYSTERECTOMY     APPENDECTOMY     CARDIAC ELECTROPHYSIOLOGY MAPPING AND ABLATION  08/28/2010   EPS/RFA of AVNRT  - Dr. Sharrell Ku   CATARACT EXTRACTION Bilateral    CORONARY STENT PLACEMENT  09/2012   IR GENERIC HISTORICAL  07/29/2016   IR RADIOLOGY PERIPHERAL GUIDED IV START 07/29/2016 Gilmer Mor, DO [facility]   IR GENERIC HISTORICAL  07/29/2016   IR US GUIDE VASC ACCESS RIGHT 07/29/2016 Gilmer Mor, DO [facility]   NASAL SINUS SURGERY  1993   PERCUTANEOUS STENT INTERVENTION Left 10/18/2012   Procedure: PERCUTANEOUS STENT INTERVENTION;  Surgeon: [name], MD;  Location: [facility] CATH LAB;  Service: Cardiovascular;  Laterality: Left;   TOTAL KNEE ARTHROPLASTY Right 08/21/2019   TOTAL KNEE ARTHROPLASTY Right 08/21/2019   Procedure: RIGHT TOTAL KNEE ARTHROPLASTY;  Surgeon: Kathryne Hitch, MD;  Location: [facility] OR;  Service: Orthopedics;  Laterality: Right;   UNILATERAL UPPER EXTREMEITY ANGIOGRAM Left 10/18/2012   Procedure: UNILATERAL UPPER Salvadore Oxford;  Surgeon: [name], MD;  Location: [facility] CATH LAB;  Service: Cardiovascular;  Laterality: Left;   Social History   Occupational History   Occupation: DISABLED    Employer: UNEMPLOYED  Tobacco Use    Smoking status: Former    Packs/day: 0.50    Years: 40.00    Additional pack years: 0.00    Total pack years: 20.00    Types: Cigarettes    Quit date: 2018    Years since quitting: 6.4   Smokeless tobacco: Never   Tobacco comments:    September 2011, again Oct 2013  Vaping Use   Vaping Use: Never used  Substance and Sexual Activity   Alcohol  use: No   Drug use: No   Sexual activity: Not on file

## 2023-05-02 ENCOUNTER — Other Ambulatory Visit (HOSPITAL_COMMUNITY): Payer: Self-pay

## 2023-05-02 MED ORDER — CLONAZEPAM 0.5 MG PO TABS
0.5000 mg | ORAL_TABLET | Freq: Two times a day (BID) | ORAL | 2 refills | Status: DC | PRN
  Filled 2023-05-02: qty 60, 30d supply, fill #0
  Filled 2023-05-25 – 2023-06-29 (×3): qty 60, 30d supply, fill #1
  Filled 2023-07-27: qty 60, 30d supply, fill #2

## 2023-05-13 ENCOUNTER — Other Ambulatory Visit (HOSPITAL_COMMUNITY): Payer: Self-pay

## 2023-05-13 MED ORDER — SYNTHROID 100 MCG PO TABS
ORAL_TABLET | ORAL | 1 refills | Status: DC
  Filled 2023-05-13: qty 30, 30d supply, fill #0

## 2023-05-24 ENCOUNTER — Other Ambulatory Visit (HOSPITAL_COMMUNITY): Payer: Self-pay

## 2023-05-25 ENCOUNTER — Other Ambulatory Visit (HOSPITAL_COMMUNITY): Payer: Self-pay

## 2023-05-26 ENCOUNTER — Other Ambulatory Visit (HOSPITAL_COMMUNITY): Payer: Self-pay

## 2023-05-27 ENCOUNTER — Other Ambulatory Visit (HOSPITAL_COMMUNITY): Payer: Self-pay

## 2023-05-31 ENCOUNTER — Other Ambulatory Visit (HOSPITAL_COMMUNITY): Payer: Self-pay

## 2023-05-31 MED ORDER — AMITRIPTYLINE HCL 50 MG PO TABS
50.0000 mg | ORAL_TABLET | Freq: Every day | ORAL | 3 refills | Status: DC
  Filled 2023-05-31: qty 90, 90d supply, fill #0

## 2023-05-31 MED ORDER — CLONAZEPAM 0.5 MG PO TABS
0.5000 mg | ORAL_TABLET | Freq: Two times a day (BID) | ORAL | 0 refills | Status: DC | PRN
  Filled 2023-05-31 – 2023-06-01 (×2): qty 60, 30d supply, fill #0

## 2023-06-01 ENCOUNTER — Other Ambulatory Visit: Payer: Self-pay

## 2023-06-01 ENCOUNTER — Other Ambulatory Visit (HOSPITAL_COMMUNITY): Payer: Self-pay

## 2023-06-01 ENCOUNTER — Other Ambulatory Visit: Payer: Medicare Other

## 2023-06-01 ENCOUNTER — Other Ambulatory Visit: Payer: Self-pay | Admitting: Family Medicine

## 2023-06-01 DIAGNOSIS — R229 Localized swelling, mass and lump, unspecified: Secondary | ICD-10-CM

## 2023-06-03 ENCOUNTER — Other Ambulatory Visit (HOSPITAL_COMMUNITY): Payer: Self-pay

## 2023-06-03 MED ORDER — LEVOTHYROXINE SODIUM 125 MCG PO TABS
125.0000 ug | ORAL_TABLET | Freq: Every morning | ORAL | 2 refills | Status: DC
  Filled 2023-06-03: qty 30, 30d supply, fill #0

## 2023-06-11 ENCOUNTER — Other Ambulatory Visit (HOSPITAL_COMMUNITY): Payer: Self-pay

## 2023-06-20 ENCOUNTER — Other Ambulatory Visit: Payer: Medicare Other

## 2023-06-28 ENCOUNTER — Other Ambulatory Visit (HOSPITAL_COMMUNITY): Payer: Self-pay

## 2023-06-28 ENCOUNTER — Other Ambulatory Visit: Payer: Medicare Other

## 2023-06-28 MED ORDER — LEVOTHYROXINE SODIUM 137 MCG PO TABS
137.0000 ug | ORAL_TABLET | Freq: Every morning | ORAL | 2 refills | Status: DC
  Filled 2023-06-28: qty 30, 30d supply, fill #0
  Filled 2023-07-27: qty 30, 30d supply, fill #1

## 2023-06-29 ENCOUNTER — Other Ambulatory Visit: Payer: Self-pay

## 2023-06-29 ENCOUNTER — Other Ambulatory Visit (HOSPITAL_COMMUNITY): Payer: Self-pay

## 2023-07-01 ENCOUNTER — Other Ambulatory Visit (HOSPITAL_COMMUNITY): Payer: Self-pay

## 2023-07-01 MED ORDER — HYDROCHLOROTHIAZIDE 12.5 MG PO TABS
12.5000 mg | ORAL_TABLET | Freq: Every day | ORAL | 0 refills | Status: DC
  Filled 2023-07-01: qty 30, 30d supply, fill #0

## 2023-07-05 ENCOUNTER — Other Ambulatory Visit (HOSPITAL_COMMUNITY): Payer: Self-pay

## 2023-07-05 MED ORDER — OXYCODONE HCL 5 MG PO TABS
ORAL_TABLET | ORAL | 0 refills | Status: DC
  Filled 2023-07-05: qty 12, 3d supply, fill #0

## 2023-07-27 ENCOUNTER — Other Ambulatory Visit: Payer: Self-pay

## 2023-07-27 ENCOUNTER — Other Ambulatory Visit (HOSPITAL_COMMUNITY): Payer: Self-pay

## 2023-07-27 MED ORDER — HYDROCHLOROTHIAZIDE 12.5 MG PO TABS
12.5000 mg | ORAL_TABLET | Freq: Every day | ORAL | 0 refills | Status: DC
  Filled 2023-07-27: qty 30, 30d supply, fill #0

## 2023-07-27 MED ORDER — FLUTICASONE-SALMETEROL 115-21 MCG/ACT IN AERO
2.0000 | INHALATION_SPRAY | Freq: Two times a day (BID) | RESPIRATORY_TRACT | 0 refills | Status: DC
  Filled 2023-07-27: qty 12, 30d supply, fill #0

## 2023-07-29 ENCOUNTER — Other Ambulatory Visit (HOSPITAL_COMMUNITY): Payer: Self-pay

## 2023-08-12 ENCOUNTER — Other Ambulatory Visit: Payer: Self-pay

## 2023-08-12 ENCOUNTER — Emergency Department (HOSPITAL_COMMUNITY)
Admission: EM | Admit: 2023-08-12 | Discharge: 2023-08-13 | Disposition: A | Discharge status: Home / Self Care | Payer: Medicare Other | Attending: Emergency Medicine | Admitting: Emergency Medicine

## 2023-08-12 ENCOUNTER — Emergency Department (HOSPITAL_COMMUNITY): Payer: Medicare Other

## 2023-08-12 DIAGNOSIS — R4182 Altered mental status, unspecified: Secondary | ICD-10-CM | POA: Diagnosis present

## 2023-08-12 DIAGNOSIS — I959 Hypotension, unspecified: Secondary | ICD-10-CM | POA: Diagnosis not present

## 2023-08-12 DIAGNOSIS — Z79899 Other long term (current) drug therapy: Secondary | ICD-10-CM | POA: Diagnosis not present

## 2023-08-12 DIAGNOSIS — T50904A Poisoning by unspecified drugs, medicaments and biological substances, undetermined, initial encounter: Secondary | ICD-10-CM

## 2023-08-12 LAB — COMPREHENSIVE METABOLIC PANEL
ALT: 23 U/L (ref 0–44)
AST: 22 U/L (ref 15–41)
Albumin: 3.9 g/dL (ref 3.5–5.0)
Alkaline Phosphatase: 73 U/L (ref 38–126)
Anion gap: 10 (ref 5–15)
BUN: 40 mg/dL — ABNORMAL HIGH (ref 8–23)
CO2: 26 mmol/L (ref 22–32)
Calcium: 8.4 mg/dL — ABNORMAL LOW (ref 8.9–10.3)
Chloride: 98 mmol/L (ref 98–111)
Creatinine, Ser: 1.45 mg/dL — ABNORMAL HIGH (ref 0.44–1.00)
GFR, Estimated: 38 mL/min — ABNORMAL LOW (ref >60)
Glucose, Bld: 127 mg/dL — ABNORMAL HIGH (ref 70–99)
Potassium: 3.8 mmol/L (ref 3.5–5.1)
Sodium: 134 mmol/L — ABNORMAL LOW (ref 135–145)
Total Bilirubin: 0.6 mg/dL (ref 0.3–1.2)
Total Protein: 6.8 g/dL (ref 6.5–8.1)

## 2023-08-12 LAB — CBC WITH DIFFERENTIAL/PLATELET
Abs Immature Granulocytes: 0.01 10*3/uL (ref 0.00–0.07)
Basophils Absolute: 0 10*3/uL (ref 0.0–0.1)
Basophils Relative: 1 %
Eosinophils Absolute: 0.1 10*3/uL (ref 0.0–0.5)
Eosinophils Relative: 2 %
HCT: 33.3 % — ABNORMAL LOW (ref 36.0–46.0)
Hemoglobin: 10.5 g/dL — ABNORMAL LOW (ref 12.0–15.0)
Immature Granulocytes: 0 %
Lymphocytes Relative: 19 %
Lymphs Abs: 1.1 10*3/uL (ref 0.7–4.0)
MCH: 30.1 pg (ref 26.0–34.0)
MCHC: 31.5 g/dL (ref 30.0–36.0)
MCV: 95.4 fL (ref 80.0–100.0)
Monocytes Absolute: 0.5 10*3/uL (ref 0.1–1.0)
Monocytes Relative: 9 %
Neutro Abs: 4 10*3/uL (ref 1.7–7.7)
Neutrophils Relative %: 69 %
Platelets: 264 10*3/uL (ref 150–400)
RBC: 3.49 MIL/uL — ABNORMAL LOW (ref 3.87–5.11)
RDW: 14.3 % (ref 11.5–15.5)
WBC: 5.7 10*3/uL (ref 4.0–10.5)
nRBC: 0 % (ref 0.0–0.2)

## 2023-08-12 LAB — ETHANOL: Alcohol, Ethyl (B): <10 mg/dL (ref <10)

## 2023-08-12 LAB — SALICYLATE LEVEL: Salicylate Lvl: <7 mg/dL — ABNORMAL LOW (ref 7.0–30.0)

## 2023-08-12 LAB — ACETAMINOPHEN LEVEL: Acetaminophen (Tylenol), Serum: <10 ug/mL — ABNORMAL LOW (ref 10–30)

## 2023-08-12 MED ORDER — SODIUM CHLORIDE 0.9 % IV BOLUS
1000.0000 mL | Freq: Once | INTRAVENOUS | Status: AC
  Administered 2023-08-12: 1000 mL via INTRAVENOUS

## 2023-08-12 NOTE — ED Provider Notes (Signed)
[place] EMERGENCY DEPARTMENT AT [facility] Provider Note   CSN: [id number] Arrival date & time: 08/12/23  2002     History  No chief complaint on file.   Joann Ray is a 77 y.o. female history of anxiety, depression and insomnia, anemia, reflux, here presenting with possible overdose.  Patient chronically takes extra Klonopin and amitriptyline.  Patient states that she was feeling anxious and went to the CBD store and took 2 CBD Gummies.  She also may have taken extra of her Klonopin and amitriptyline.  Son-in-law is a ER nurse and she called him and was slurring her words so he went to check on her.  She appeared very sedated so EMS was called.  Per EMS, patient was hypotensive initially.  The history is provided by the patient.       Home Medications Prior to Admission medications   Medication Sig Start Date End Date Taking? Authorizing Provider  acetaminophen-codeine (TYLENOL #3) 300-30 MG tablet Take 1-2 tablets by mouth every 8 (eight) hours as needed for moderate pain. 11/05/22   Kathryne Hitch, MD  ADVAIR [facility] 115-21 MCG/ACT inhaler Inhale 2 puffs into the lungs 2 (two) times daily. 05/02/20   [provider]  albuterol (PROAIR HFA) 108 (90 Base) MCG/ACT inhaler INHALE 2 PUFFS INTO THE LUNGS TWICE A DAY Patient taking differently: Inhale 2 puffs into the lungs every 4 (four) hours as needed for wheezing. INHALE 2 PUFFS INTO THE LUNGS TWICE A DAY 02/26/19   Willow Ora, MD  albuterol (VENTOLIN HFA) 108 (90 Base) MCG/ACT inhaler Inhale 1 - 2 puffs into the lungs 4 times daily as needed 10/22/22     amitriptyline (ELAVIL) 50 MG tablet Take 1 tablet (50 mg total) by mouth at bedtime. 05/31/23     amLODipine (NORVASC) 2.5 MG tablet Take 1 tablet (2.5 mg total) by mouth daily. 12/07/18   Willow Ora, MD  amLODipine (NORVASC) 2.5 MG tablet Take 1 tablet (2.5 mg total) by mouth daily for blood pressure 04/01/23     atenolol (TENORMIN) 25 MG  tablet Take one tablet twice a day as needed for rapid heart rate. Patient taking differently: Take 25 mg by mouth as needed (rapid heart rate). Take one tablet twice a day as needed for rapid heart rate. 12/07/18   Willow Ora, MD  Cholecalciferol (VITAMIN D) 2000 UNITS CAPS Take 1 capsule (2,000 Units total) by mouth daily. Patient taking differently: Take 1,000 Units by mouth daily. 11/15/14   Artist Pais, Doe-Hyun R, DO  clonazePAM (KLONOPIN) 0.5 MG tablet Take 0.5 mg by mouth 2 (two) times daily as needed. 07/27/19   [provider]  clonazePAM (KLONOPIN) 0.5 MG tablet Take 1 tablet (0.5 mg total) by mouth 2 (two) times daily as needed for anxiety 05/02/23     clonazePAM (KLONOPIN) 0.5 MG tablet Take 1 tablet (0.5 mg) by mouth 2 times daily as needed for anxiety 05/31/23     diclofenac (VOLTAREN) 75 MG EC tablet Take 75 mg by mouth as needed for mild pain (arthritis).  07/25/19   [provider]  dicyclomine (BENTYL) 10 MG capsule Take 10 mg by mouth as needed for spasms.  09/06/16   [provider]  DULoxetine (CYMBALTA) 30 MG capsule Take 30 mg by mouth daily. 04/18/21   [provider]  famotidine (PEPCID) 20 MG tablet Take by mouth.    [provider]  famotidine (PEPCID) 40 MG tablet Take 40 mg by  mouth daily.    [provider]  fluticasone-salmeterol (ADVAIR HFA) 115-21 MCG/ACT inhaler Inhale 2 puffs into the lungs 2 (two) times daily. 07/27/23     furosemide (LASIX) 20 MG tablet Take 20 mg by mouth as needed for fluid.     [provider]  hydrochlorothiazide (HYDRODIURIL) 12.5 MG tablet Take 1 tablet (12.5 mg total) by mouth daily. 07/27/23     hydrOXYzine (ATARAX/VISTARIL) 50 MG tablet Take 1 tablet (50 mg total) by mouth at bedtime. Patient not taking: Reported on 05/27/2021 12/07/18   Willow Ora, MD  levothyroxine (SYNTHROID) 137 MCG tablet Take 1 tablet (137 mcg total) by mouth every morning. 06/28/23     levothyroxine (SYNTHROID) 75  MCG tablet TAKE 1 TABLET BY MOUTH EVERY DAY AT 6:00 AM 05/17/22   [provider]  levothyroxine (SYNTHROID, LEVOTHROID) 50 MCG tablet Take 0.5 tablets (25 mcg total) by mouth daily before breakfast. 12/07/18   Willow Ora, MD  losartan (COZAAR) 100 MG tablet Take 100 mg by mouth daily.  11/14/18   [provider]  methocarbamol (ROBAXIN) 500 MG tablet Take 1 tablet (500 mg total) by mouth every 6 (six) hours as needed for muscle spasms. 06/24/20   Kathryne Hitch, MD  ondansetron (ZOFRAN) 4 MG tablet TAKE 1 TABLET BY MOUTH EVERY 6 HOURS AS NEEDED FOR NAUSEA AND VOMITING Patient taking differently: Take 4 mg by mouth every 8 (eight) hours as needed for nausea or vomiting. 06/26/15   Artist Pais, Doe-Hyun R, DO  oxyCODONE (OXY IR/ROXICODONE) 5 MG immediate release tablet Take 1 tablet by mouth every 6 hours as needed for 3 days. 07/05/23     pantoprazole (PROTONIX) 40 MG tablet Take 40 mg by mouth daily. 05/25/19   [provider]  pregabalin (LYRICA) 150 MG capsule Take 1 capsule by mouth 2 (two) times daily. 05/19/22   [provider]  rosuvastatin (CRESTOR) 5 MG tablet Take 1 tablet (5 mg total) by mouth 2 (two) times a week. Patient taking differently: Take 5 mg by mouth daily at 12 noon. 12/07/18   Willow Ora, MD  sertraline (ZOLOFT) 50 MG tablet Take 50 mg by mouth daily. 02/04/22   [provider]  sertraline (ZOLOFT) 50 MG tablet Take 0.5 tablets (25 mg total) by mouth daily. 12/30/22         Allergies    Penicillins and Diazepam    Review of Systems   Review of Systems  Psychiatric/Behavioral:  The patient is nervous/anxious.   All other systems reviewed and are negative.   Physical Exam Updated Vital Signs BP 124/65   Pulse 75   Temp 97.8 F (36.6 C) (Oral)   Resp 15   SpO2 97%  Physical Exam Vitals and nursing note reviewed.  Constitutional:      Comments: Sleepy and arousable only to speech.  Patient is ANO x 0.  HENT:     Head:  Normocephalic.     Nose: Nose normal.     Mouth/Throat:     Mouth: Mucous membranes are moist.  Eyes:     Extraocular Movements: Extraocular movements intact.     Pupils: Pupils are equal, round, and reactive to light.  Cardiovascular:     Rate and Rhythm: Normal rate and regular rhythm.     Pulses: Normal pulses.     Heart sounds: Normal heart sounds.  Pulmonary:     Effort: Pulmonary effort is normal.     Breath sounds: Normal breath  sounds.  Abdominal:     General: Abdomen is flat.     Palpations: Abdomen is soft.  Musculoskeletal:        General: Normal range of motion.     Cervical back: Normal range of motion and neck supple.  Skin:    Capillary Refill: Capillary refill takes less than 2 seconds.  Neurological:     Comments: A & O x 0.  Moving all extremities.     ED Results / Procedures / Treatments   Labs (all labs ordered are listed, but only abnormal results are displayed) Labs Reviewed  CBC WITH DIFFERENTIAL/PLATELET  COMPREHENSIVE METABOLIC PANEL  ETHANOL  SALICYLATE LEVEL  ACETAMINOPHEN LEVEL  RAPID URINE DRUG SCREEN, HOSP PERFORMED    EKG EKG Interpretation Date/Time:  Friday August 12 2023 20:40:25 EDT Ventricular Rate:  66 PR Interval:  165 QRS Duration:  87 QT Interval:  419 QTC Calculation: 439 R Axis:   23  Text Interpretation: Sinus rhythm Abnormal T, consider ischemia, anterior leads No significant change since last tracing Confirmed by Richardean Canal [phone]) on 08/12/2023 8:47:39 PM  Radiology No results found.  Procedures Procedures    Medications Ordered in ED Medications  sodium chloride 0.9 % bolus 1,000 mL (has no administration in time range)    ED Course/ Medical Decision Making/ A&P                                 Medical Decision Making ONETHA RYHERD is a 77 y.o. female here presenting with altered mental status and possible drug overdose.  Patient appears very sedated.  Likely polypharmacy.  Patient is on  Klonopin and amitriptyline and chronically take more.  Patient also is taking CBD on top of that.  Plan to get CBC and CMP and drug and urine tox.  Will likely need to consult TTS.  10:50 PM Patient's labs unremarkable. Patient slightly hypotensive. Talked to poison control. They recommend observation for 10 hours and repeat EKG at 2:30 AM and Tylenol level at midnight.  Poison control will call back and follow-up  11:34 PM Repeat EKG and Tylenol level pending.  She also may had a fall and has right hip pain so hip x-rays are pending.  Signed out to Dr. Eudelia Bunch.  Amount and/or Complexity of Data Reviewed Labs: ordered. Radiology: ordered. ECG/medicine tests: ordered.    Final Clinical Impression(s) / ED Diagnoses Final diagnoses:  None    Rx / DC Orders ED Discharge Orders     None         Charlynne Pander, MD 08/12/23 2336

## 2023-08-12 NOTE — ED Triage Notes (Signed)
Pt BIB [facility] from home. Pt daughter reports pt admitted to taking 2 delta 9 gummies. (80mg ).Pt family reports pt medications being missing.  25.5 mg of clonazepam and 12 50mg  matriptyline were also missing.  Ems administered 4mg  zofran of saline.  100/44 62HR 9% RA 182 CBG

## 2023-08-13 ENCOUNTER — Other Ambulatory Visit (HOSPITAL_COMMUNITY): Payer: Self-pay

## 2023-08-13 LAB — ACETAMINOPHEN LEVEL: Acetaminophen (Tylenol), Serum: <10 ug/mL — ABNORMAL LOW (ref 10–30)

## 2023-08-13 MED ORDER — CLONAZEPAM 0.5 MG PO TABS
0.5000 mg | ORAL_TABLET | Freq: Two times a day (BID) | ORAL | 0 refills | Status: DC | PRN
  Filled 2023-08-13: qty 10, 5d supply, fill #0

## 2023-08-13 MED ORDER — SODIUM CHLORIDE 0.9 % IV BOLUS
1000.0000 mL | Freq: Once | INTRAVENOUS | Status: AC
  Administered 2023-08-13: 1000 mL via INTRAVENOUS

## 2023-08-13 NOTE — ED Notes (Signed)
Pt placed on bear hugger warmer

## 2023-08-13 NOTE — ED Provider Notes (Signed)
I assumed care of this patient from previous provider.  Please see their note for further details of history, exam, and MDM.   Briefly patient is a 77 y.o. female who presented presented for unintentional overdose on her medication and intentional ingestion of delta 9 Gummies.    Overdose labs ordered.  Currently awaiting repeat Tylenol level and EKG.   Family at bedside not concerned this was intentional suicide attempt.  Patient has had a history in the past of taking too much medicine.  Patient previously required medicine management by family but has been managing her own medicine recently.  There is a safety plan in place to help manage her medication from now on.   Tylenol level normal.  EKG reassuring.  Patient is medically cleared. Soft BPs improved with additional IV fluids.  Short course of klonopin Rx that will be managed by family to prevent Benzo W/D until she is able to see PCP next week.  The patient appears reasonably screened and/or stabilized for discharge and I doubt any other medical condition or other [facility] requiring further screening, evaluation, or treatment in the ED at this time. I have discussed the findings, Dx and Tx plan with the patient/family who expressed understanding and agree(s) with the plan. Discharge instructions discussed at length. The patient/family was given strict return precautions who verbalized understanding of the instructions. No further questions at time of discharge.  Disposition: Discharge  Condition: Good  ED Discharge Orders          Ordered    clonazePAM (KLONOPIN) 0.5 MG tablet  2 times daily PRN       Note to Pharmacy: No refills sooner than 30 days from prior refill.   08/13/23 4034                 Nira Conn, MD 08/13/23 [phone]

## 2023-08-15 ENCOUNTER — Other Ambulatory Visit (HOSPITAL_COMMUNITY): Payer: Self-pay

## 2023-09-02 ENCOUNTER — Other Ambulatory Visit (HOSPITAL_COMMUNITY): Payer: Self-pay

## 2023-09-02 MED ORDER — HYDROCHLOROTHIAZIDE 12.5 MG PO TABS
12.5000 mg | ORAL_TABLET | Freq: Every day | ORAL | 0 refills | Status: DC
  Filled 2023-09-02: qty 30, 30d supply, fill #0

## 2023-09-16 ENCOUNTER — Other Ambulatory Visit (HOSPITAL_COMMUNITY): Payer: Self-pay

## 2023-09-16 MED ORDER — AMLODIPINE BESYLATE 2.5 MG PO TABS
2.5000 mg | ORAL_TABLET | Freq: Every day | ORAL | 1 refills | Status: DC
  Filled 2023-09-16: qty 90, 90d supply, fill #0

## 2023-09-20 ENCOUNTER — Other Ambulatory Visit (HOSPITAL_COMMUNITY): Payer: Self-pay

## 2023-10-03 ENCOUNTER — Other Ambulatory Visit (HOSPITAL_COMMUNITY): Payer: Self-pay

## 2023-10-03 MED ORDER — HYDROCHLOROTHIAZIDE 12.5 MG PO TABS
12.5000 mg | ORAL_TABLET | Freq: Every day | ORAL | 0 refills | Status: DC
  Filled 2023-10-03: qty 30, 30d supply, fill #0

## 2023-10-04 ENCOUNTER — Other Ambulatory Visit (HOSPITAL_COMMUNITY): Payer: Self-pay

## 2023-10-05 ENCOUNTER — Other Ambulatory Visit (HOSPITAL_COMMUNITY): Payer: Self-pay

## 2023-10-05 MED ORDER — AMLODIPINE BESYLATE 5 MG PO TABS
5.0000 mg | ORAL_TABLET | Freq: Every day | ORAL | 3 refills | Status: DC
  Filled 2023-10-05: qty 90, 90d supply, fill #0
  Filled 2024-01-30: qty 90, 90d supply, fill #1

## 2023-10-12 ENCOUNTER — Other Ambulatory Visit (HOSPITAL_COMMUNITY): Payer: Self-pay

## 2023-10-12 MED ORDER — ALBUTEROL SULFATE HFA 108 (90 BASE) MCG/ACT IN AERS
INHALATION_SPRAY | RESPIRATORY_TRACT | 3 refills | Status: DC
  Filled 2023-10-12: qty 6.7, 30d supply, fill #0
  Filled 2024-01-05: qty 6.7, 30d supply, fill #1
  Filled 2024-01-30: qty 6.7, 30d supply, fill #2
  Filled 2024-03-07: qty 6.7, 30d supply, fill #3

## 2023-10-12 MED ORDER — FLUTICASONE-SALMETEROL 115-21 MCG/ACT IN AERO
2.0000 | INHALATION_SPRAY | Freq: Two times a day (BID) | RESPIRATORY_TRACT | 0 refills | Status: DC
  Filled 2023-10-12: qty 12, 30d supply, fill #0

## 2023-12-01 ENCOUNTER — Other Ambulatory Visit (HOSPITAL_COMMUNITY): Payer: Self-pay

## 2023-12-01 MED ORDER — HYDROCHLOROTHIAZIDE 12.5 MG PO TABS
12.5000 mg | ORAL_TABLET | Freq: Every day | ORAL | 0 refills | Status: DC
  Filled 2023-12-01: qty 90, 90d supply, fill #0

## 2023-12-02 ENCOUNTER — Other Ambulatory Visit (HOSPITAL_COMMUNITY): Payer: Self-pay

## 2023-12-09 ENCOUNTER — Other Ambulatory Visit (HOSPITAL_COMMUNITY): Payer: Self-pay

## 2023-12-09 MED ORDER — LEVOTHYROXINE SODIUM 137 MCG PO TABS
ORAL_TABLET | ORAL | 2 refills | Status: DC
  Filled 2023-12-09: qty 30, 30d supply, fill #0
  Filled 2024-01-30: qty 30, 30d supply, fill #1

## 2023-12-12 ENCOUNTER — Other Ambulatory Visit (HOSPITAL_COMMUNITY): Payer: Self-pay

## 2024-01-05 ENCOUNTER — Other Ambulatory Visit (HOSPITAL_COMMUNITY): Payer: Self-pay

## 2024-01-30 ENCOUNTER — Other Ambulatory Visit (HOSPITAL_COMMUNITY): Payer: Self-pay

## 2024-01-30 ENCOUNTER — Other Ambulatory Visit: Payer: Self-pay

## 2024-01-30 MED ORDER — SERTRALINE HCL 50 MG PO TABS
25.0000 mg | ORAL_TABLET | Freq: Every day | ORAL | 0 refills | Status: DC
  Filled 2024-01-30: qty 45, 90d supply, fill #0

## 2024-01-30 MED ORDER — FLUTICASONE-SALMETEROL 115-21 MCG/ACT IN AERO
2.0000 | INHALATION_SPRAY | Freq: Two times a day (BID) | RESPIRATORY_TRACT | 0 refills | Status: DC
  Filled 2024-01-30 – 2024-05-01 (×4): qty 12, 30d supply, fill #0

## 2024-01-31 ENCOUNTER — Other Ambulatory Visit (HOSPITAL_COMMUNITY): Payer: Self-pay

## 2024-02-10 ENCOUNTER — Other Ambulatory Visit (HOSPITAL_COMMUNITY): Payer: Self-pay

## 2024-02-17 ENCOUNTER — Other Ambulatory Visit (HOSPITAL_COMMUNITY): Payer: Self-pay

## 2024-02-17 MED ORDER — LOSARTAN POTASSIUM 50 MG PO TABS
50.0000 mg | ORAL_TABLET | Freq: Every day | ORAL | 3 refills | Status: DC
  Filled 2024-02-17: qty 90, 90d supply, fill #0
  Filled 2024-05-17: qty 90, 90d supply, fill #1

## 2024-02-18 ENCOUNTER — Other Ambulatory Visit (HOSPITAL_COMMUNITY): Payer: Self-pay

## 2024-02-20 ENCOUNTER — Other Ambulatory Visit (HOSPITAL_COMMUNITY): Payer: Self-pay

## 2024-02-20 MED ORDER — HYDROCHLOROTHIAZIDE 12.5 MG PO TABS
12.5000 mg | ORAL_TABLET | Freq: Every day | ORAL | 0 refills | Status: DC
Start: 2024-02-20 — End: 2024-04-10
  Filled 2024-02-20: qty 90, 90d supply, fill #0

## 2024-03-07 ENCOUNTER — Other Ambulatory Visit (HOSPITAL_COMMUNITY): Payer: Self-pay

## 2024-04-04 ENCOUNTER — Other Ambulatory Visit (HOSPITAL_COMMUNITY): Payer: Self-pay

## 2024-04-04 MED ORDER — AMLODIPINE BESYLATE 10 MG PO TABS
10.0000 mg | ORAL_TABLET | Freq: Every day | ORAL | 3 refills | Status: DC
  Filled 2024-04-04: qty 90, 90d supply, fill #0

## 2024-04-07 ENCOUNTER — Other Ambulatory Visit (HOSPITAL_COMMUNITY): Payer: Self-pay

## 2024-04-10 ENCOUNTER — Other Ambulatory Visit (HOSPITAL_COMMUNITY): Payer: Self-pay

## 2024-04-10 MED ORDER — AMLODIPINE BESYLATE 5 MG PO TABS
5.0000 mg | ORAL_TABLET | Freq: Every day | ORAL | 3 refills | Status: AC
  Filled 2024-04-10: qty 90, 90d supply, fill #0

## 2024-04-10 MED ORDER — ALBUTEROL SULFATE HFA 108 (90 BASE) MCG/ACT IN AERS
1.0000 | INHALATION_SPRAY | Freq: Four times a day (QID) | RESPIRATORY_TRACT | 3 refills | Status: AC
Start: 2024-04-10 — End: ?
  Filled 2024-04-10: qty 6.7, 30d supply, fill #0
  Filled 2024-05-17: qty 6.7, 30d supply, fill #1
  Filled 2024-07-25: qty 6.7, 30d supply, fill #2
  Filled 2024-08-17: qty 6.7, 30d supply, fill #3

## 2024-04-10 MED ORDER — HYDROCHLOROTHIAZIDE 25 MG PO TABS
12.5000 mg | ORAL_TABLET | Freq: Every day | ORAL | 1 refills | Status: DC
  Filled 2024-04-10: qty 45, 90d supply, fill #0

## 2024-05-01 ENCOUNTER — Other Ambulatory Visit (HOSPITAL_COMMUNITY): Payer: Self-pay

## 2024-05-04 ENCOUNTER — Other Ambulatory Visit (HOSPITAL_COMMUNITY): Payer: Self-pay

## 2024-05-07 ENCOUNTER — Other Ambulatory Visit (HOSPITAL_COMMUNITY): Payer: Self-pay

## 2024-05-07 MED ORDER — EZETIMIBE 10 MG PO TABS
10.0000 mg | ORAL_TABLET | Freq: Every day | ORAL | 2 refills | Status: DC
  Filled 2024-05-07: qty 30, 30d supply, fill #0
  Filled 2024-06-13: qty 30, 30d supply, fill #1
  Filled 2024-07-25: qty 30, 30d supply, fill #2

## 2024-05-07 MED ORDER — LEVOTHYROXINE SODIUM 137 MCG PO TABS
137.0000 ug | ORAL_TABLET | Freq: Every morning | ORAL | 2 refills | Status: DC
  Filled 2024-05-07: qty 30, 30d supply, fill #0
  Filled 2024-06-13: qty 30, 30d supply, fill #1
  Filled 2024-07-25: qty 30, 30d supply, fill #2

## 2024-05-17 ENCOUNTER — Other Ambulatory Visit (HOSPITAL_COMMUNITY): Payer: Self-pay

## 2024-05-21 ENCOUNTER — Other Ambulatory Visit (HOSPITAL_COMMUNITY): Payer: Self-pay

## 2024-05-21 MED ORDER — HYDROCHLOROTHIAZIDE 25 MG PO TABS
25.0000 mg | ORAL_TABLET | Freq: Every day | ORAL | 1 refills | Status: DC
  Filled 2024-05-21: qty 90, 90d supply, fill #0
  Filled 2024-08-12: qty 90, 90d supply, fill #1

## 2024-06-12 ENCOUNTER — Encounter: Payer: Self-pay | Admitting: Podiatry

## 2024-06-12 ENCOUNTER — Ambulatory Visit (INDEPENDENT_AMBULATORY_CARE_PROVIDER_SITE_OTHER): Admitting: Podiatry

## 2024-06-12 ENCOUNTER — Ambulatory Visit (INDEPENDENT_AMBULATORY_CARE_PROVIDER_SITE_OTHER)

## 2024-06-12 DIAGNOSIS — M7741 Metatarsalgia, right foot: Secondary | ICD-10-CM | POA: Diagnosis not present

## 2024-06-12 DIAGNOSIS — M7751 Other enthesopathy of right foot: Secondary | ICD-10-CM | POA: Diagnosis not present

## 2024-06-12 DIAGNOSIS — M2012 Hallux valgus (acquired), left foot: Secondary | ICD-10-CM

## 2024-06-12 NOTE — Progress Notes (Signed)
 Subjective:  Patient ID: Joann Ray, female    DOB: 09-23-1946,  MRN: [id number] HPI Chief Complaint  Patient presents with   Foot Pain    Plantar forefoot right - bunion surgery x 1 year ago, feels like she is walking on a knot now for the past couple months, numbness that is running up into the ankle as well   New Patient (Initial Visit)    78 y.o. female presents with the above complaint.   ROS: Denies fever chills (muscle aches pains calf pain back pain chest pain shortness of breath.  States that she has nocturnal pain that wakes her up as well as she uses Voltaren  gel for that.  Does not want to take any type of oral medication.  We placed hematuria over the steps.  Past Medical History:  Diagnosis Date   Allergy    Anemia    Anxiety    Arrhythmia     AVNRT   Arthritis    Asthma    Blood transfusion without reported diagnosis    Cataract    COPD (chronic obstructive pulmonary disease) (HCC)    Depression    Family history of adverse reaction to anesthesia     My yougest daughter gets real sick    Fibromyalgia    Hyperlipidemia    Hypertension    Hypothyroidism    Migraine    PONV (postoperative nausea and vomiting)    Past Surgical History:  Procedure Laterality Date   ABDOMINAL HYSTERECTOMY     APPENDECTOMY     CARDIAC ELECTROPHYSIOLOGY MAPPING AND ABLATION  08/28/2010   EPS/RFA of AVNRT  - Dr. Cathlyn Birmingham   CATARACT EXTRACTION Bilateral    CORONARY STENT PLACEMENT  09/2012   IR GENERIC HISTORICAL  07/29/2016   IR RADIOLOGY PERIPHERAL GUIDED IV START 07/29/2016 Ami Bellman, DO [facility]   IR GENERIC HISTORICAL  07/29/2016   IR US  GUIDE VASC ACCESS RIGHT 07/29/2016 Ami Bellman, DO [facility]   NASAL SINUS SURGERY  1993   PERCUTANEOUS STENT INTERVENTION Left 10/18/2012   Procedure: PERCUTANEOUS STENT INTERVENTION;  Surgeon: Ozell Fell, MD;  Location: [facility] CATH LAB;  Service: Cardiovascular;  Laterality: Left;   TOTAL KNEE ARTHROPLASTY  Right 08/21/2019   TOTAL KNEE ARTHROPLASTY Right 08/21/2019   Procedure: RIGHT TOTAL KNEE ARTHROPLASTY;  Surgeon: Vernetta Lonni GRADE, MD;  Location: [facility] OR;  Service: Orthopedics;  Laterality: Right;   UNILATERAL UPPER EXTREMEITY ANGIOGRAM Left 10/18/2012   Procedure: UNILATERAL UPPER VENITA BILE;  Surgeon: Ozell Fell, MD;  Location: [facility] CATH LAB;  Service: Cardiovascular;  Laterality: Left;    Current Outpatient Medications:    albuterol  (VENTOLIN  HFA) 108 (90 Base) MCG/ACT inhaler, Inhale 1-2 puffs into the lungs 4 (four) times daily as needed, Disp: 6.7 g, Rfl: 3   amLODipine  (NORVASC ) 5 MG tablet, Take 1 tablet (5 mg total) by mouth daily., Disp: 90 tablet, Rfl: 3   Cholecalciferol  (VITAMIN D3) 50 MCG (2000 UT) TABS, Take 4,000 Units by mouth daily., Disp: , Rfl:    clonazePAM  (KLONOPIN ) 0.5 MG tablet, Take 1 tablet (0.5 mg total) by mouth 2 (two) times daily as needed for anxiety (Patient taking differently: Take 0.5 mg by mouth 2 (two) times daily.), Disp: 60 tablet, Rfl: 2   clonazePAM  (KLONOPIN ) 0.5 MG tablet, Take 1 tablet (0.5 mg) by mouth 2 times daily as needed for anxiety, Disp: 10 tablet, Rfl: 0   diclofenac  (VOLTAREN ) 75 MG EC tablet, Take 75 mg by mouth 2 (two)  times daily as needed (for arthritic pain)., Disp: , Rfl:    ezetimibe  (ZETIA ) 10 MG tablet, Take 1 tablet (10 mg total) by mouth daily., Disp: 30 tablet, Rfl: 2   famotidine (PEPCID) 40 MG tablet, Take 40 mg by mouth every 4 (four) hours as needed for heartburn or indigestion., Disp: , Rfl:    fluticasone -salmeterol (ADVAIR  HFA) 115-21 MCG/ACT inhaler, Inhale 2 puffs into the lungs 2 (two) times daily., Disp: 12 g, Rfl: 0   hydrochlorothiazide  (HYDRODIURIL ) 25 MG tablet, Take 1 tablet (25 mg total) by mouth daily., Disp: 90 tablet, Rfl: 1   levothyroxine  (SYNTHROID ) 137 MCG tablet, Take 1 tablet (137 mcg total) by mouth in the morning., Disp: 30 tablet, Rfl: 2   losartan  (COZAAR ) 100 MG tablet, Take 50 mg by  mouth daily., Disp: , Rfl:    NON FORMULARY, Take 1-2 tablets by mouth See admin instructions. Delta 9 400 mg watermelon gummies- Chew 1-2 gummies by mouth daily as needed for relaxation and/or pain relief, Disp: , Rfl:    ondansetron  (ZOFRAN ) 4 MG tablet, TAKE 1 TABLET BY MOUTH EVERY 6 HOURS AS NEEDED FOR NAUSEA AND VOMITING (Patient taking differently: Take 4 mg by mouth every 6 (six) hours as needed for nausea or vomiting.), Disp: 30 tablet, Rfl: 1   sertraline  (ZOLOFT ) 50 MG tablet, Take 0.5 tablets (25 mg total) by mouth daily., Disp: 135 tablet, Rfl: 0   TYLENOL  325 MG tablet, Take 325-650 mg by mouth every 6 (six) hours as needed for mild pain or moderate pain., Disp: , Rfl:   Allergies  Allergen Reactions   Penicillins Anaphylaxis, Hives, Swelling and Other (See Comments)    Did it involve swelling of the face/tongue/throat, SOB, or low BP? Yes Did it involve sudden or severe rash/hives, skin peeling, or any reaction on the inside of your mouth or nose? No Did you need to seek medical attention at a hospital or doctor's office? Yes When did it last happen?      50 years ago If all above answers are NO, may proceed with cephalosporin use.   Levothyroxine  Nausea And Vomiting and Other (See Comments)    Patient said this makes her head buzz   Rosuvastatin  Other (See Comments)    Myalgias   Diazepam  Palpitations   Review of Systems Objective:  There were no vitals filed for this visit.  General: Well developed, nourished, in no acute distress, alert and oriented x3   Dermatological: Skin is warm, dry and supple bilateral. Nails x 10 are well maintained; remaining integument appears unremarkable at this time. There are no open sores, no preulcerative lesions, no rash or signs of infection present.  Vascular: Dorsalis Pedis artery and Posterior Tibial artery pedal pulses are 2/4 bilateral with immedate capillary fill time. Pedal hair growth present. No varicosities and no lower  extremity edema present bilateral.   Neruologic: Grossly intact via light touch bilateral. Vibratory intact via tuning fork bilateral. Protective threshold with Semmes Wienstein monofilament intact to all pedal sites bilateral. Patellar and Achilles deep tendon reflexes 2+ bilateral. No Babinski or clonus noted bilateral.   Musculoskeletal: No gross boney pedal deformities bilateral. No pain, crepitus, or limitation noted with foot and ankle range of motion bilateral. Muscular strength 5/5 in all groups tested bilateral.  Fusion of the first metatarsal phalangeal joint has resulted in a prominent second metatarsal phalangeal joint.  Severe hallux valgus deformity left.  Gait: Unassisted, Nonantalgic.    Radiographs:  Radiographs taken today demonstrate  osseously mature individual good fusion of the first metatarsal phalangeal joint of the right foot left foot demonstrates severe hallux valgus deformity and demineralized bone bilaterally.  Elongated plantarflexed second metatarsal right and left possibly consistent with some of the numbness and tingling that she develops when she starts walking.  Assessment & Plan:   Assessment: Bilateral neuropathy.  Anatomical limitation of ambulation resulting in forefoot symptomatology bilateral.  Plan: Discussed oral medication for neuropathy and discussed appropriate shoes and balance exercise.  He does not want oral therapy.  She will continue to use of her topical anti-inflammatory cream.     Jailene Cupit T. Allen, [place]

## 2024-06-13 ENCOUNTER — Other Ambulatory Visit (HOSPITAL_COMMUNITY): Payer: Self-pay

## 2024-07-25 ENCOUNTER — Other Ambulatory Visit (HOSPITAL_COMMUNITY): Payer: Self-pay

## 2024-07-26 ENCOUNTER — Other Ambulatory Visit (HOSPITAL_COMMUNITY): Payer: Self-pay

## 2024-07-27 ENCOUNTER — Other Ambulatory Visit (HOSPITAL_COMMUNITY): Payer: Self-pay

## 2024-07-31 ENCOUNTER — Other Ambulatory Visit: Payer: Self-pay

## 2024-08-03 ENCOUNTER — Other Ambulatory Visit (HOSPITAL_COMMUNITY): Payer: Self-pay

## 2024-08-03 MED ORDER — FLUTICASONE-SALMETEROL 115-21 MCG/ACT IN AERO
2.0000 | INHALATION_SPRAY | Freq: Two times a day (BID) | RESPIRATORY_TRACT | 0 refills | Status: DC
  Filled 2024-08-03: qty 12, 30d supply, fill #0

## 2024-08-07 ENCOUNTER — Other Ambulatory Visit (HOSPITAL_COMMUNITY): Payer: Self-pay

## 2024-08-15 ENCOUNTER — Other Ambulatory Visit: Payer: Self-pay

## 2024-08-15 ENCOUNTER — Other Ambulatory Visit (HOSPITAL_COMMUNITY): Payer: Self-pay

## 2024-08-15 MED ORDER — LOSARTAN POTASSIUM 50 MG PO TABS
50.0000 mg | ORAL_TABLET | Freq: Every day | ORAL | 3 refills | Status: AC
  Filled 2024-08-16 (×3): qty 90, 90d supply, fill #0
  Filled 2024-11-15: qty 90, 90d supply, fill #1

## 2024-08-16 ENCOUNTER — Other Ambulatory Visit (HOSPITAL_COMMUNITY): Payer: Self-pay

## 2024-08-17 ENCOUNTER — Other Ambulatory Visit: Payer: Self-pay

## 2024-08-17 ENCOUNTER — Other Ambulatory Visit (HOSPITAL_COMMUNITY): Payer: Self-pay

## 2024-08-17 MED ORDER — EZETIMIBE 10 MG PO TABS
10.0000 mg | ORAL_TABLET | Freq: Every day | ORAL | 2 refills | Status: AC
  Filled 2024-08-17: qty 30, 30d supply, fill #0
  Filled 2024-11-15: qty 30, 30d supply, fill #1

## 2024-08-17 MED ORDER — LEVOTHYROXINE SODIUM 137 MCG PO TABS
137.0000 ug | ORAL_TABLET | Freq: Every morning | ORAL | 2 refills | Status: DC
  Filled 2024-08-17: qty 30, 30d supply, fill #0

## 2024-08-24 ENCOUNTER — Other Ambulatory Visit (HOSPITAL_COMMUNITY): Payer: Self-pay

## 2024-08-30 ENCOUNTER — Other Ambulatory Visit (HOSPITAL_COMMUNITY): Payer: Self-pay

## 2024-08-30 MED ORDER — FLUTICASONE-SALMETEROL 115-21 MCG/ACT IN AERO
2.0000 | INHALATION_SPRAY | Freq: Two times a day (BID) | RESPIRATORY_TRACT | 0 refills | Status: DC
  Filled 2024-08-30: qty 12, 30d supply, fill #0

## 2024-09-05 ENCOUNTER — Other Ambulatory Visit (HOSPITAL_COMMUNITY): Payer: Self-pay

## 2024-09-16 ENCOUNTER — Other Ambulatory Visit (HOSPITAL_COMMUNITY): Payer: Self-pay

## 2024-09-20 ENCOUNTER — Other Ambulatory Visit (HOSPITAL_COMMUNITY): Payer: Self-pay

## 2024-09-20 MED ORDER — METHYLPREDNISOLONE 4 MG PO TBPK
ORAL_TABLET | ORAL | 0 refills | Status: AC
  Filled 2024-09-20: qty 21, 6d supply, fill #0

## 2024-09-20 MED ORDER — DOXYCYCLINE HYCLATE 100 MG PO TABS
ORAL_TABLET | ORAL | 0 refills | Status: DC
  Filled 2024-09-20: qty 20, 10d supply, fill #0

## 2024-09-29 ENCOUNTER — Other Ambulatory Visit (HOSPITAL_COMMUNITY): Payer: Self-pay

## 2024-10-01 ENCOUNTER — Other Ambulatory Visit (HOSPITAL_COMMUNITY): Payer: Self-pay

## 2024-10-01 MED ORDER — FLUTICASONE-SALMETEROL 115-21 MCG/ACT IN AERO
2.0000 | INHALATION_SPRAY | Freq: Two times a day (BID) | RESPIRATORY_TRACT | 0 refills | Status: DC
  Filled 2024-10-01: qty 12, 30d supply, fill #0

## 2024-10-10 ENCOUNTER — Other Ambulatory Visit (HOSPITAL_COMMUNITY): Payer: Self-pay

## 2024-10-10 MED ORDER — PANTOPRAZOLE SODIUM 40 MG PO TBEC
40.0000 mg | DELAYED_RELEASE_TABLET | Freq: Every morning | ORAL | 1 refills | Status: AC
  Filled 2024-10-10: qty 90, 90d supply, fill #0

## 2024-10-10 MED ORDER — SUCRALFATE 1 G PO TABS
1.0000 g | ORAL_TABLET | Freq: Three times a day (TID) | ORAL | 1 refills | Status: AC
  Filled 2024-10-10: qty 120, 30d supply, fill #0
  Filled 2024-11-15: qty 120, 30d supply, fill #1

## 2024-10-11 ENCOUNTER — Telehealth: Payer: Self-pay

## 2024-10-11 NOTE — Telephone Encounter (Signed)
-----   Message from Norleen Kiang sent at 10/11/2024 11:20 AM EST ----- Regarding: RE: Debbie's mom needs appt Yes, we will be happy to see her that day at that time. Rock, schedule and let patient know. Thanks ----- Message ----- From: Avram Lupita BRAVO, MD Sent: 10/11/2024  10:10 AM EST To: Norleen LOISE Kiang, MD; Rock JONELLE Mania, RN Subject: Debbie's mom needs appt                        She is having LUQ pain Saw PCP yesterday  Looks like you have some saved spots 11/20 and if she could come at 1140 that would be great.  I can let ger know if that works.  Thanks,  Lupita

## 2024-10-11 NOTE — Telephone Encounter (Signed)
 Pt scheduled to see Dr. Abran 10/18/24 at 11:40am. Pt notified of appt.

## 2024-10-16 ENCOUNTER — Other Ambulatory Visit (HOSPITAL_COMMUNITY): Payer: Self-pay

## 2024-10-16 ENCOUNTER — Other Ambulatory Visit: Payer: Self-pay

## 2024-10-16 MED ORDER — LEVOTHYROXINE SODIUM 150 MCG PO TABS
ORAL_TABLET | ORAL | 1 refills | Status: AC
  Filled 2024-10-16: qty 30, 30d supply, fill #0

## 2024-10-16 MED ORDER — AMLODIPINE BESYLATE 5 MG PO TABS
5.0000 mg | ORAL_TABLET | Freq: Every day | ORAL | 1 refills | Status: DC
  Filled 2024-10-16: qty 90, 90d supply, fill #0

## 2024-10-18 ENCOUNTER — Encounter: Payer: Self-pay | Admitting: Internal Medicine

## 2024-10-18 ENCOUNTER — Other Ambulatory Visit (HOSPITAL_COMMUNITY): Payer: Self-pay

## 2024-10-18 ENCOUNTER — Ambulatory Visit: Admitting: Internal Medicine

## 2024-10-18 VITALS — BP 140/82 | HR 66 | Ht 62.0 in | Wt 145.1 lb

## 2024-10-18 DIAGNOSIS — R42 Dizziness and giddiness: Secondary | ICD-10-CM | POA: Diagnosis not present

## 2024-10-18 DIAGNOSIS — E038 Other specified hypothyroidism: Secondary | ICD-10-CM

## 2024-10-18 DIAGNOSIS — R1012 Left upper quadrant pain: Secondary | ICD-10-CM

## 2024-10-18 DIAGNOSIS — K219 Gastro-esophageal reflux disease without esophagitis: Secondary | ICD-10-CM | POA: Diagnosis not present

## 2024-10-18 DIAGNOSIS — Z860100 Personal history of colon polyps, unspecified: Secondary | ICD-10-CM

## 2024-10-18 DIAGNOSIS — R935 Abnormal findings on diagnostic imaging of other abdominal regions, including retroperitoneum: Secondary | ICD-10-CM

## 2024-10-18 MED ORDER — NA SULFATE-K SULFATE-MG SULF 17.5-3.13-1.6 GM/177ML PO SOLN
1.0000 | Freq: Once | ORAL | 0 refills | Status: AC
  Filled 2024-10-18: qty 354, 1d supply, fill #0

## 2024-10-18 NOTE — Progress Notes (Signed)
 HISTORY OF PRESENT ILLNESS:  Joann Ray is a pleasant 78 y.o. female, mother-in-law of Dr. Lupita Commander, with multiple general medical problems as listed below.  I have seen the patient in the past for colonoscopy.  She presents today, accompanied by her daughter, with a chief complaint of 4 weeks of left upper quadrant discomfort and dizziness.  Patient reports that about 4 weeks ago she began to have problems with left upper quadrant discomfort.  This seems to be exacerbated by meals and associated with nausea but no vomiting.  Discomfort is described as daily.  Does not seem to bother her while sleeping.  She has also noticed problems with classic reflux symptoms such as pyrosis for which her PCP initiated pantoprazole  and sucralfate  about 6 days ago.  This seems to be helping her symptoms.  She does mention that the discomfort can vary with changes in body position such as leaning toward the left.  She denies a history of a rash in the dermatomal region of her discomfort.  She does have a history of fibromyalgia.  She also has mild peripheral neuropathy.  DATA 1.  COLONOSCOPY June 2012 with nonadvanced adenoma. 2.  COLONOSCOPY June 2022 multiple adenomatous polyps including a 30 mm sigmoid colon adenoma.  Sigmoid diverticulosis. 3.  CT of the abdomen and pelvis with contrast February 17, 2022.  Indication: Left side abdominal pain radiating to the right side. Moderate size hiatal hernia.  Aortoiliac atherosclerosis.  Unremarkable otherwise 4.  LABORATORIES: October 15, 2024.  TSH 111.7.  B12 normal.  Elevated lipids with cholesterol 429.  Basic metabolic panel with creatinine 1.59 and GFR of 33.  Normal liver tests.  Normal iron studies.  Hemoglobin 11.6.  REVIEW OF SYSTEMS:  All non-GI ROS negative except for difficulty sleeping, arthritis  Past Medical History:  Diagnosis Date   Allergy    Anemia    Anxiety    Arrhythmia     AVNRT   Arthritis    Asthma    Blood transfusion  without reported diagnosis    Cataract    COPD (chronic obstructive pulmonary disease) (HCC)    Depression    Family history of adverse reaction to anesthesia     My yougest daughter gets real sick    Fibromyalgia    Hyperlipidemia    Hypertension    Hypothyroidism    Migraine    PONV (postoperative nausea and vomiting)     Past Surgical History:  Procedure Laterality Date   ABDOMINAL HYSTERECTOMY     APPENDECTOMY     CARDIAC ELECTROPHYSIOLOGY MAPPING AND ABLATION  08/28/2010   EPS/RFA of AVNRT  - Dr. Cathlyn Birmingham   CATARACT EXTRACTION Bilateral    CORONARY STENT PLACEMENT  09/2012   IR GENERIC HISTORICAL  07/29/2016   IR RADIOLOGY PERIPHERAL GUIDED IV START 07/29/2016 Ami Bellman, DO [facility]   IR GENERIC HISTORICAL  07/29/2016   IR US  GUIDE VASC ACCESS RIGHT 07/29/2016 Ami Bellman, DO [facility]   NASAL SINUS SURGERY  1993   PERCUTANEOUS STENT INTERVENTION Left 10/18/2012   Procedure: PERCUTANEOUS STENT INTERVENTION;  Surgeon: Ozell Fell, MD;  Location: [facility] CATH LAB;  Service: Cardiovascular;  Laterality: Left;   TOTAL KNEE ARTHROPLASTY Right 08/21/2019   TOTAL KNEE ARTHROPLASTY Right 08/21/2019   Procedure: RIGHT TOTAL KNEE ARTHROPLASTY;  Surgeon: Vernetta Lonni GRADE, MD;  Location: [facility] OR;  Service: Orthopedics;  Laterality: Right;   UNILATERAL UPPER EXTREMEITY ANGIOGRAM Left 10/18/2012   Procedure: UNILATERAL UPPER EXTREMEITY ANGIOGRAM;  Surgeon: Ozell Fell, MD;  Location: [facility] CATH LAB;  Service: Cardiovascular;  Laterality: Left;    Social History BRYLEY CHRISMAN  reports that she quit smoking about 7 years ago. Her smoking use included cigarettes. She started smoking about 47 years ago. She has a 20 pack-year smoking history. She has never used smokeless tobacco. She reports that she does not drink alcohol and does not use drugs.  family history includes Breast cancer in her paternal aunt; Colon cancer (age of onset: 33) in her sister; Colon  cancer (age of onset: 63) in her brother; Dementia in her mother; Diabetes in her mother; Skin cancer in her father; Stomach cancer in her paternal aunt; Stroke in her mother and sister.  Allergies  Allergen Reactions   Penicillins Anaphylaxis, Hives, Swelling and Other (See Comments)    Did it involve swelling of the face/tongue/throat, SOB, or low BP? Yes Did it involve sudden or severe rash/hives, skin peeling, or any reaction on the inside of your mouth or nose? No Did you need to seek medical attention at a hospital or doctor's office? Yes When did it last happen?      50 years ago If all above answers are NO, may proceed with cephalosporin use.   Levothyroxine  Nausea And Vomiting and Other (See Comments)    Patient said this makes her head buzz   Rosuvastatin  Other (See Comments)    Myalgias   Diazepam  Palpitations       PHYSICAL EXAMINATION: Vital signs: BP (!) 140/82   Pulse 66   Ht 5' 2 (1.575 m)   Wt 145 lb 2 oz (65.8 kg)   BMI 26.54 kg/m   Constitutional: Pleasant, puffiness in face, generally well-appearing, no acute distress Psychiatric: alert and oriented x3, cooperative Eyes: extraocular movements intact, anicteric, conjunctiva pink Mouth: oral pharynx moist, no lesions Neck: supple no lymphadenopathy Cardiovascular: heart regular rate and rhythm, no murmur Lungs: clear to auscultation bilaterally Abdomen: soft, tenderness left abdomen with minimal palpation, nondistended, no obvious ascites, no peritoneal signs, normal bowel sounds, no organomegaly Rectal: Omitted Extremities: no clubbing, cyanosis,.  Trace lower extremity edema bilaterally Skin: no lesions on visible extremities Neuro: No gross deficits  ASSESSMENT:  1.  4-week history of left upper quadrant discomfort, worse after meals and associated with nausea.  Etiology not clear based on history or physical exam.  Some musculoskeletal components (she does have a history of fibromyalgia).  Recently  having active reflux symptoms.  Moderate to large hiatal hernia on previous imaging.  Previous advanced imaging 2023 was for left discomfort (question the same). 2.  GERD.  Recently started on PPI and sucralfate.  Seems to be helping 3.  History of advanced adenoma June 2022.  Due for surveillance 4.  Recent complaints of dizziness upon standing.  Not felt to be related to GI complaints.  Needs evaluated. 5.  Hypothyroidism with markedly elevated TSH.  This may be contributing to some complaints and suboptimal sense of wellbeing.    PLAN:  1.  Continue pantoprazole  daily 2.  Stop sucralfate 3.  Schedule upper endoscopy to evaluate persistent upper GI complaints the patient with known moderate-sized hiatal hernia.The nature of the procedure, as well as the risks, benefits, and alternatives were carefully and thoroughly reviewed with the patient. Ample time for discussion and questions allowed. The patient understood, was satisfied, and agreed to proceed. 4.  Schedule surveillance colonoscopy.The nature of the procedure, as well as the risks, benefits, and alternatives were carefully and thoroughly  reviewed with the patient. Ample time for discussion and questions allowed. The patient understood, was satisfied, and agreed to proceed. 5.  Return to PCP for hypothyroidism management 6.  Advised to see neurology regarding complaints of postural dizziness 7.  If the above unrevealing, we will move toward preventative imaging, such as CT.  A total time of 60 minutes was spent preparing to see the patient, reviewing the myriad of records, obtaining comprehensive history, performing medically appropriate physical examination, counseling and educating the patient and her daughter regarding the above listed issues, adjusting medical therapy, ordering endoscopic procedures, and documenting clinical information in the health record.

## 2024-10-18 NOTE — Patient Instructions (Signed)
 You have been scheduled for an endoscopy and colonoscopy. Please follow the written instructions given to you at your visit today.  If you use inhalers (even only as needed), please bring them with you on the day of your procedure.  DO NOT TAKE 7 DAYS PRIOR TO TEST- Trulicity (dulaglutide) Ozempic, Wegovy (semaglutide) Mounjaro, Zepbound (tirzepatide) Bydureon Bcise (exanatide extended release)  DO NOT TAKE 1 DAY PRIOR TO YOUR TEST Rybelsus (semaglutide) Adlyxin (lixisenatide) Victoza (liraglutide) Byetta (exanatide) ___________________________________________________________________________ _______________________________________________________  If your blood pressure at your visit was 140/90 or greater, please contact your primary care physician to follow up on this.  _______________________________________________________  If you are age 62 or older, your body mass index should be between 23-30. Your Body mass index is 26.54 kg/m. If this is out of the aforementioned range listed, please consider follow up with your Primary Care Provider.  If you are age 76 or younger, your body mass index should be between 19-25. Your Body mass index is 26.54 kg/m. If this is out of the aformentioned range listed, please consider follow up with your Primary Care Provider.   ________________________________________________________  The [place] [facility] providers would like to encourage you to use MYCHART to communicate with providers for non-urgent requests or questions.  Due to long hold times on the telephone, sending your provider a message by [facility] may be a faster and more efficient way to get a response.  Please allow 48 business hours for a response.  Please remember that this is for non-urgent requests.  _______________________________________________________  [facility] is using a team-based approach to care.  Your team is made up of your doctor and two to three APPS. Our APPS  (Nurse Practitioners and Physician Assistants) work with your physician to ensure care continuity for you. They are fully qualified to address your health concerns and develop a treatment plan. They communicate directly with your gastroenterologist to care for you. Seeing the Advanced Practice Practitioners on your physician's team can help you by facilitating care more promptly, often allowing for earlier appointments, access to diagnostic testing, procedures, and other specialty referrals.

## 2024-10-22 ENCOUNTER — Other Ambulatory Visit (HOSPITAL_COMMUNITY): Payer: Self-pay

## 2024-11-01 ENCOUNTER — Ambulatory Visit: Admitting: Internal Medicine

## 2024-11-01 ENCOUNTER — Encounter: Payer: Self-pay | Admitting: Internal Medicine

## 2024-11-01 VITALS — BP 151/63 | HR 60 | Temp 97.0°F | Resp 10 | Ht 62.0 in | Wt 145.0 lb

## 2024-11-01 DIAGNOSIS — K219 Gastro-esophageal reflux disease without esophagitis: Secondary | ICD-10-CM

## 2024-11-01 DIAGNOSIS — D122 Benign neoplasm of ascending colon: Secondary | ICD-10-CM | POA: Diagnosis not present

## 2024-11-01 DIAGNOSIS — R935 Abnormal findings on diagnostic imaging of other abdominal regions, including retroperitoneum: Secondary | ICD-10-CM

## 2024-11-01 DIAGNOSIS — K222 Esophageal obstruction: Secondary | ICD-10-CM

## 2024-11-01 DIAGNOSIS — Z860100 Personal history of colon polyps, unspecified: Secondary | ICD-10-CM

## 2024-11-01 DIAGNOSIS — R1012 Left upper quadrant pain: Secondary | ICD-10-CM

## 2024-11-01 MED ORDER — ONDANSETRON 4 MG PO TBDP
4.0000 mg | ORAL_TABLET | Freq: Once | ORAL | Status: AC
  Administered 2024-11-01: 4 mg via ORAL

## 2024-11-01 MED ORDER — SODIUM CHLORIDE 0.9 % IV SOLN: 500.0000 mL | Freq: Once | INTRAVENOUS | Status: DC

## 2024-11-01 NOTE — Op Note (Signed)
 [facility] Patient Name: Joann Ray Procedure Date: 11/01/2024 2:22 PM MRN: [id number] Endoscopist: Norleen SAILOR. Abran , MD, [id number] Age: 78 Referring MD:  Date of Birth: 08-22-1946 Gender: Female Account #: [id number] Procedure:                Colonoscopy with EMR x 1; biopsy x 1; submucosal                            injection?"tattoo Indications:              High risk colon cancer surveillance: Personal                            history of adenoma (10 mm or greater in size), High                            risk colon cancer surveillance: Personal history of                            multiple (3 or more) adenomas. Previous exam 2020 Medicines:                Monitored Anesthesia Care Procedure:                Pre-Anesthesia Assessment:                           - Prior to the procedure, a History and Physical                            was performed, and patient medications and                            allergies were reviewed. The patient's tolerance of                            previous anesthesia was also reviewed. The risks                            and benefits of the procedure and the sedation                            options and risks were discussed with the patient.                            All questions were answered, and informed consent                            was obtained. Prior Anticoagulants: The patient has                            taken no anticoagulant or antiplatelet agents. ASA                            Grade Assessment: II - A patient with mild systemic  disease. After reviewing the risks and benefits,                            the patient was deemed in satisfactory condition to                            undergo the procedure.                           After obtaining informed consent, the colonoscope                            was passed under direct vision. Throughout the                             procedure, the patient's blood pressure, pulse, and                            oxygen saturations were monitored continuously. The                            Olympus Scope [id number] was introduced through the                            anus and advanced to the the cecum, identified by                            appendiceal orifice and ileocecal valve. The                            ileocecal valve, appendiceal orifice, and rectum                            were photographed. The quality of the bowel                            preparation was excellent. The colonoscopy was                            performed without difficulty. The patient tolerated                            the procedure well. The bowel preparation used was                            SUPREP via split dose instruction. Scope In: 2:28:29 PM Scope Out: 3:03:10 PM Scope Withdrawal Time: 0 hours 28 minutes 55 seconds  Total Procedure Duration: 0 hours 34 minutes 41 seconds  Findings:                 A 35 mm polyp was found in the ascending colon. The                            polyp was sessile. The polyp was removed with  endoscopic mucosal resection (saline injection-lift                            technique using a cold snare). The polyp was                            removed with a piecemeal technique using a cold                            snare. Resection and retrieval were complete.. A                            marking tattoo was injected submucosally downstream                            from the resected lesion. See images                           A 1 mm polyp was found in the ascending colon. The                            polyp was removed with a jumbo cold forceps.                            Resection and retrieval were complete.                           Multiple diverticula were found in the sigmoid                            colon. Previously placed marking tattoo in the                             sigmoid colon was readily visible.                           The exam was otherwise without abnormality on                            direct and retroflexion views. Complications:            No immediate complications. Estimated blood loss:                            None. Estimated Blood Loss:     Estimated blood loss: none. Impression:               - One 35 mm polyp in the ascending colon, removed                            using injection-lift and a cold snare and removed                            piecemeal using a cold snare. Resected and  retrieved.                           - One 1 mm polyp in the ascending colon, removed                            with a jumbo cold forceps. Resected and retrieved.                           - Diverticulosis in the sigmoid colon.                           - The examination was otherwise normal on direct                            and retroflexion views. Recommendation:           - Repeat colonoscopy in 6 months for surveillance.                           - Patient has a contact number available for                            emergencies. The signs and symptoms of potential                            delayed complications were discussed with the                            patient. Return to normal activities tomorrow.                            Written discharge instructions were provided to the                            patient.                           - Resume previous diet.                           - Continue present medications.                           - Await pathology results. Norleen SAILOR. Abran, MD 11/01/2024 3:13:31 PM This report has been signed electronically.

## 2024-11-01 NOTE — Progress Notes (Addendum)
 Upon sitting up to get dressed, pt began complaining of gas pain at 6/10. Pt given Levsin and Simethicone PO. Pt wheeled to the restroom, whereupon pt began complaining of worsening pain at 8/10 and nausea. Pt given sublingual Zofran . Pt transferred back to the bed for positional gas-relieving attempts. When this did not resolve pain, a rectal tube was inserted to expel trapped gas. Pt reported relief and a pain level of 1/10 at time of discharge.

## 2024-11-01 NOTE — Progress Notes (Signed)
 A/o x 3, VSS, good SR's, pleased with anesthesia, report to RN

## 2024-11-01 NOTE — Patient Instructions (Addendum)
 Resume previous activities.  Continue present medications.  Await pathology results.   Repeat colonoscopy in 6 months for surveillance.   YOU HAD AN ENDOSCOPIC PROCEDURE TODAY AT THE [facility]:   Refer to the procedure report that was given to you for any specific questions about what was found during the examination.  If the procedure report does not answer your questions, please call your gastroenterologist to clarify.  If you requested that your care partner not be given the details of your procedure findings, then the procedure report has been included in a sealed envelope for you to review at your convenience later.  YOU SHOULD EXPECT: Some feelings of bloating in the abdomen. Passage of more gas than usual.  Walking can help get rid of the air that was put into your GI tract during the procedure and reduce the bloating. If you had a lower endoscopy (such as a colonoscopy or flexible sigmoidoscopy) you may notice spotting of blood in your stool or on the toilet paper. If you underwent a bowel prep for your procedure, you may not have a normal bowel movement for a few days.  Please Note:  You might notice some irritation and congestion in your nose or some drainage.  This is from the oxygen used during your procedure.  There is no need for concern and it should clear up in a day or so.  SYMPTOMS TO REPORT IMMEDIATELY:  Following lower endoscopy (colonoscopy or flexible sigmoidoscopy):  Excessive amounts of blood in the stool  Significant tenderness or worsening of abdominal pains  Swelling of the abdomen that is new, acute  Fever of 100F or higher  Following upper endoscopy (EGD)  Vomiting of blood or coffee ground material  New chest pain or pain under the shoulder blades  Painful or persistently difficult swallowing  New shortness of breath  Fever of 100F or higher  Black, tarry-looking stools  For urgent or emergent issues, a gastroenterologist can be reached at  any hour by calling [phone]. Do not use MyChart messaging for urgent concerns.    DIET:  We do recommend a small meal at first, but then you may proceed to your regular diet.  Drink plenty of fluids but you should avoid alcoholic beverages for 24 hours.  ACTIVITY:  You should plan to take it easy for the rest of today and you should NOT DRIVE or use heavy machinery until tomorrow (because of the sedation medicines used during the test).    FOLLOW UP: Our staff will call the number listed on your records the next business day following your procedure.  We will call around 7:15- 8:00 am to check on you and address any questions or concerns that you may have regarding the information given to you following your procedure. If we do not reach you, we will leave a message.     If any biopsies were taken you will be contacted by phone or by letter within the next 1-3 weeks.  Please call us  at [phone] if you have not heard about the biopsies in 3 weeks.    SIGNATURES/CONFIDENTIALITY: You and/or your care partner have signed paperwork which will be entered into your electronic medical record.  These signatures attest to the fact that that the information above on your After Visit Summary has been reviewed and is understood.  Full responsibility of the confidentiality of this discharge information lies with you and/or your care-partner.

## 2024-11-01 NOTE — Op Note (Addendum)
 [facility] Patient Name: Joann Ray Procedure Date: 11/01/2024 2:12 PM MRN: [id number] Endoscopist: Norleen SAILOR. Abran , MD, [id number] Age: 78 Referring MD:  Date of Birth: 10/22/1946 Gender: Female Account #: [id number] Procedure:                Upper GI endoscopy Indications:              Abdominal pain in the left upper quadrant,                            Esophageal reflux Medicines:                Monitored Anesthesia Care Procedure:                Pre-Anesthesia Assessment:                           - Prior to the procedure, a History and Physical                            was performed, and patient medications and                            allergies were reviewed. The patient's tolerance of                            previous anesthesia was also reviewed. The risks                            and benefits of the procedure and the sedation                            options and risks were discussed with the patient.                            All questions were answered, and informed consent                            was obtained. Prior Anticoagulants: The patient has                            taken no anticoagulant or antiplatelet agents. ASA                            Grade Assessment: II - A patient with mild systemic                            disease. After reviewing the risks and benefits,                            the patient was deemed in satisfactory condition to                            undergo the procedure.  After obtaining informed consent, the endoscope was                            passed under direct vision. Throughout the                            procedure, the patient's blood pressure, pulse, and                            oxygen saturations were monitored continuously. The                            Olympus Scope SN [id number] was introduced through the                            mouth, and advanced to the third  part of duodenum.                            The upper GI endoscopy was accomplished without                            difficulty. The patient tolerated the procedure                            well. Scope In: Scope Out: Findings:                 The esophagus a large caliber distal esophageal                            ringlike stricture at the GE junction (35 cm from                            the incisors).                           The stomach revealed a moderate size hiatal hernia.                            The stomach was otherwise normal.                           The examined duodenum was normal to the third                            portion.                           The cardia and gastric fundus were normal on                            retroflexion. Complications:            No immediate complications. Estimated Blood Loss:     Estimated blood loss: none. Impression:               1. Distal  esophageal stricture                           2. Moderate hiatal hernia                           3. Otherwise unremarkable EGD to the third duodenum. Recommendation:           - Patient has a contact number available for                            emergencies. The signs and symptoms of potential                            delayed complications were discussed with the                            patient. Return to normal activities tomorrow.                            Written discharge instructions were provided to the                            patient.                           - Resume previous diet.                           - Continue present medications. Norleen SAILOR. Abran, MD 11/01/2024 3:24:44 PM This report has been signed electronically.

## 2024-11-01 NOTE — Progress Notes (Signed)
 Pt's states no medical or surgical changes since previsit or office visit.

## 2024-11-01 NOTE — Progress Notes (Signed)
 Expand All Collapse All HISTORY OF PRESENT ILLNESS:   Joann Ray is a pleasant 78 y.o. female, mother-in-law of Dr. Lupita Commander, with multiple general medical problems as listed below.  I have seen the patient in the past for colonoscopy.  She presents today, accompanied by her daughter, with a chief complaint of 4 weeks of left upper quadrant discomfort and dizziness.   Patient reports that about 4 weeks ago she began to have problems with left upper quadrant discomfort.  This seems to be exacerbated by meals and associated with nausea but no vomiting.  Discomfort is described as daily.  Does not seem to bother her while sleeping.  She has also noticed problems with classic reflux symptoms such as pyrosis for which her PCP initiated pantoprazole  and sucralfate  about 6 days ago.  This seems to be helping her symptoms.  She does mention that the discomfort can vary with changes in body position such as leaning toward the left.  She denies a history of a rash in the dermatomal region of her discomfort.  She does have a history of fibromyalgia.  She also has mild peripheral neuropathy.   DATA 1.  COLONOSCOPY June 2012 with nonadvanced adenoma. 2.  COLONOSCOPY June 2022 multiple adenomatous polyps including a 30 mm sigmoid colon adenoma.  Sigmoid diverticulosis. 3.  CT of the abdomen and pelvis with contrast February 17, 2022.  Indication: Left side abdominal pain radiating to the right side. Moderate size hiatal hernia.  Aortoiliac atherosclerosis.  Unremarkable otherwise 4.  LABORATORIES: October 15, 2024.  TSH 111.7.  B12 normal.  Elevated lipids with cholesterol 429.  Basic metabolic panel with creatinine 1.59 and GFR of 33.  Normal liver tests.  Normal iron studies.  Hemoglobin 11.6.   REVIEW OF SYSTEMS:   All non-GI ROS negative except for difficulty sleeping, arthritis       Past Medical History:  Diagnosis Date   Allergy     Anemia     Anxiety     Arrhythmia       AVNRT    Arthritis     Asthma     Blood transfusion without reported diagnosis     Cataract     COPD (chronic obstructive pulmonary disease) (HCC)     Depression     Family history of adverse reaction to anesthesia       My yougest daughter gets real sick    Fibromyalgia     Hyperlipidemia     Hypertension     Hypothyroidism     Migraine     PONV (postoperative nausea and vomiting)                 Past Surgical History:  Procedure Laterality Date   ABDOMINAL HYSTERECTOMY       APPENDECTOMY       CARDIAC ELECTROPHYSIOLOGY MAPPING AND ABLATION   08/28/2010    EPS/RFA of AVNRT  - Dr. Cathlyn Birmingham   CATARACT EXTRACTION Bilateral     CORONARY STENT PLACEMENT   09/2012   IR GENERIC HISTORICAL   07/29/2016    IR RADIOLOGY PERIPHERAL GUIDED IV START 07/29/2016 Ami Bellman, DO [facility]   IR GENERIC HISTORICAL   07/29/2016    IR US  GUIDE VASC ACCESS RIGHT 07/29/2016 Ami Bellman, DO [facility]   NASAL SINUS SURGERY   1993   PERCUTANEOUS STENT INTERVENTION Left 10/18/2012    Procedure: PERCUTANEOUS STENT INTERVENTION;  Surgeon: Ozell Fell, MD;  Location: [facility] CATH LAB;  Service: Cardiovascular;  Laterality: Left;   TOTAL KNEE ARTHROPLASTY Right 08/21/2019   TOTAL KNEE ARTHROPLASTY Right 08/21/2019    Procedure: RIGHT TOTAL KNEE ARTHROPLASTY;  Surgeon: Vernetta Lonni GRADE, MD;  Location: [facility] OR;  Service: Orthopedics;  Laterality: Right;   UNILATERAL UPPER EXTREMEITY ANGIOGRAM Left 10/18/2012    Procedure: UNILATERAL UPPER VENITA BILE;  Surgeon: Ozell Fell, MD;  Location: [facility] CATH LAB;  Service: Cardiovascular;  Laterality: Left;          Social History Joann Ray  reports that she quit smoking about 7 years ago. Her smoking use included cigarettes. She started smoking about 47 years ago. She has a 20 pack-year smoking history. She has never used smokeless tobacco. She reports that she does not drink alcohol and does not use drugs.   family history  includes Breast cancer in her paternal aunt; Colon cancer (age of onset: 67) in her sister; Colon cancer (age of onset: 15) in her brother; Dementia in her mother; Diabetes in her mother; Skin cancer in her father; Stomach cancer in her paternal aunt; Stroke in her mother and sister.   Allergies       Allergies  Allergen Reactions   Penicillins Anaphylaxis, Hives, Swelling and Other (See Comments)      Did it involve swelling of the face/tongue/throat, SOB, or low BP? Yes Did it involve sudden or severe rash/hives, skin peeling, or any reaction on the inside of your mouth or nose? No Did you need to seek medical attention at a hospital or doctor's office? Yes When did it last happen?      50 years ago If all above answers are NO, may proceed with cephalosporin use.   Levothyroxine  Nausea And Vomiting and Other (See Comments)      Patient said this makes her head buzz   Rosuvastatin  Other (See Comments)      Myalgias   Diazepam  Palpitations            PHYSICAL EXAMINATION: Vital signs: BP (!) 140/82   Pulse 66   Ht 5' 2 (1.575 m)   Wt 145 lb 2 oz (65.8 kg)   BMI 26.54 kg/m   Constitutional: Pleasant, puffiness in face, generally well-appearing, no acute distress Psychiatric: alert and oriented x3, cooperative Eyes: extraocular movements intact, anicteric, conjunctiva pink Mouth: oral pharynx moist, no lesions Neck: supple no lymphadenopathy Cardiovascular: heart regular rate and rhythm, no murmur Lungs: clear to auscultation bilaterally Abdomen: soft, tenderness left abdomen with minimal palpation, nondistended, no obvious ascites, no peritoneal signs, normal bowel sounds, no organomegaly Rectal: Omitted Extremities: no clubbing, cyanosis,.  Trace lower extremity edema bilaterally Skin: no lesions on visible extremities Neuro: No gross deficits   ASSESSMENT:   1.  4-week history of left upper quadrant discomfort, worse after meals and associated with nausea.  Etiology  not clear based on history or physical exam.  Some musculoskeletal components (she does have a history of fibromyalgia).  Recently having active reflux symptoms.  Moderate to large hiatal hernia on previous imaging.  Previous advanced imaging 2023 was for left discomfort (question the same). 2.  GERD.  Recently started on PPI and sucralfate .  Seems to be helping 3.  History of advanced adenoma June 2022.  Due for surveillance 4.  Recent complaints of dizziness upon standing.  Not felt to be related to GI complaints.  Needs evaluated. 5.  Hypothyroidism with markedly elevated TSH.  This may be contributing to some complaints and suboptimal sense of wellbeing.  PLAN:   1.  Continue pantoprazole  daily 2.  Stop sucralfate  3.  Schedule upper endoscopy to evaluate persistent upper GI complaints the patient with known moderate-sized hiatal hernia.The nature of the procedure, as well as the risks, benefits, and alternatives were carefully and thoroughly reviewed with the patient. Ample time for discussion and questions allowed. The patient understood, was satisfied, and agreed to proceed. 4.  Schedule surveillance colonoscopy.The nature of the procedure, as well as the risks, benefits, and alternatives were carefully and thoroughly reviewed with the patient. Ample time for discussion and questions allowed. The patient understood, was satisfied, and agreed to proceed. 5.  Return to PCP for hypothyroidism management 6.  Advised to see neurology regarding complaints of postural dizziness 7.  If the above unrevealing, we will move toward preventative imaging, such as CT.

## 2024-11-02 ENCOUNTER — Other Ambulatory Visit (HOSPITAL_COMMUNITY): Payer: Self-pay

## 2024-11-02 ENCOUNTER — Telehealth: Payer: Self-pay

## 2024-11-02 MED ORDER — FLUTICASONE-SALMETEROL 115-21 MCG/ACT IN AERO
2.0000 | INHALATION_SPRAY | Freq: Two times a day (BID) | RESPIRATORY_TRACT | 0 refills | Status: DC
  Filled 2024-11-02 – 2024-11-15 (×2): qty 12, 30d supply, fill #0

## 2024-11-02 NOTE — Telephone Encounter (Signed)
  Follow up Call-     11/01/2024    1:56 PM  Call back number  Post procedure Call Back phone  # [phone]  Permission to leave phone message Yes     Patient questions:  Do you have a fever, pain , or abdominal swelling? No. Pain Score  0 *  Have you tolerated food without any problems? Yes.    Have you been able to return to your normal activities? Yes.    Do you have any questions about your discharge instructions: Diet   No. Medications  No. Follow up visit  No.  Do you have questions or concerns about your Care? No.  Actions: * If pain score is 4 or above: No action needed, pain <4.

## 2024-11-06 ENCOUNTER — Ambulatory Visit: Payer: Self-pay | Admitting: Internal Medicine

## 2024-11-06 LAB — SURGICAL PATHOLOGY

## 2024-11-12 ENCOUNTER — Other Ambulatory Visit (HOSPITAL_COMMUNITY): Payer: Self-pay

## 2024-11-12 ENCOUNTER — Other Ambulatory Visit: Payer: Self-pay

## 2024-11-12 MED ORDER — LEVOTHYROXINE SODIUM 150 MCG PO TABS
ORAL_TABLET | ORAL | 1 refills | Status: AC
  Filled 2024-11-12: qty 30, 30d supply, fill #0

## 2024-11-13 ENCOUNTER — Other Ambulatory Visit (HOSPITAL_COMMUNITY): Payer: Self-pay

## 2024-11-15 ENCOUNTER — Other Ambulatory Visit: Payer: Self-pay

## 2024-11-15 ENCOUNTER — Other Ambulatory Visit (HOSPITAL_COMMUNITY): Payer: Self-pay

## 2024-11-15 MED ORDER — HYDROCHLOROTHIAZIDE 25 MG PO TABS
25.0000 mg | ORAL_TABLET | Freq: Every day | ORAL | 1 refills | Status: AC
  Filled 2024-11-15: qty 90, 90d supply, fill #0

## 2024-11-17 ENCOUNTER — Other Ambulatory Visit (HOSPITAL_COMMUNITY): Payer: Self-pay

## 2024-12-10 ENCOUNTER — Other Ambulatory Visit (HOSPITAL_COMMUNITY): Payer: Self-pay

## 2024-12-10 MED ORDER — FLUTICASONE-SALMETEROL 115-21 MCG/ACT IN AERO
2.0000 | INHALATION_SPRAY | Freq: Two times a day (BID) | RESPIRATORY_TRACT | 0 refills | Status: AC
  Filled 2024-12-10: qty 12, 30d supply, fill #0

## 2024-12-14 ENCOUNTER — Other Ambulatory Visit (HOSPITAL_COMMUNITY): Payer: Self-pay

## 2024-12-17 ENCOUNTER — Other Ambulatory Visit (HOSPITAL_COMMUNITY): Payer: Self-pay
